# Patient Record
Sex: Female | Born: 1935
Health system: Southern US, Community
[De-identification: ages and names within clinical notes are randomized; demographics above are authoritative.]

## PROBLEM LIST (undated history)

## (undated) DIAGNOSIS — H9190 Unspecified hearing loss, unspecified ear: Secondary | ICD-10-CM

## (undated) DIAGNOSIS — G43909 Migraine, unspecified, not intractable, without status migrainosus: Secondary | ICD-10-CM

## (undated) DIAGNOSIS — N390 Urinary tract infection, site not specified: Secondary | ICD-10-CM

## (undated) DIAGNOSIS — I639 Cerebral infarction, unspecified: Secondary | ICD-10-CM

## (undated) DIAGNOSIS — F419 Anxiety disorder, unspecified: Secondary | ICD-10-CM

## (undated) HISTORY — PX: ABDOMINAL HYSTERECTOMY: SHX81

## (undated) HISTORY — PX: BLADDER SURGERY: SHX569

---

## 2010-12-31 ENCOUNTER — Encounter: Payer: Self-pay | Admitting: Internal Medicine

## 2014-03-25 DIAGNOSIS — S129XXA Fracture of neck, unspecified, initial encounter: Secondary | ICD-10-CM | POA: Insufficient documentation

## 2014-12-29 DIAGNOSIS — M5136 Other intervertebral disc degeneration, lumbar region: Secondary | ICD-10-CM | POA: Insufficient documentation

## 2014-12-29 DIAGNOSIS — M47819 Spondylosis without myelopathy or radiculopathy, site unspecified: Secondary | ICD-10-CM | POA: Insufficient documentation

## 2016-01-04 DIAGNOSIS — H903 Sensorineural hearing loss, bilateral: Secondary | ICD-10-CM | POA: Insufficient documentation

## 2016-03-16 DIAGNOSIS — I493 Ventricular premature depolarization: Secondary | ICD-10-CM | POA: Insufficient documentation

## 2016-03-16 DIAGNOSIS — R002 Palpitations: Secondary | ICD-10-CM | POA: Insufficient documentation

## 2016-10-11 DIAGNOSIS — M26609 Unspecified temporomandibular joint disorder, unspecified side: Secondary | ICD-10-CM | POA: Insufficient documentation

## 2016-11-03 DIAGNOSIS — G4730 Sleep apnea, unspecified: Secondary | ICD-10-CM | POA: Insufficient documentation

## 2016-11-03 DIAGNOSIS — F419 Anxiety disorder, unspecified: Secondary | ICD-10-CM | POA: Insufficient documentation

## 2016-11-03 DIAGNOSIS — F411 Generalized anxiety disorder: Secondary | ICD-10-CM | POA: Insufficient documentation

## 2017-11-19 ENCOUNTER — Emergency Department (HOSPITAL_BASED_OUTPATIENT_CLINIC_OR_DEPARTMENT_OTHER)
Admission: EM | Admit: 2017-11-19 | Discharge: 2017-11-19 | Disposition: A | Discharge status: Home / Self Care | Payer: Medicare HMO | Attending: Emergency Medicine | Admitting: Emergency Medicine

## 2017-11-19 ENCOUNTER — Other Ambulatory Visit: Payer: Self-pay

## 2017-11-19 ENCOUNTER — Encounter (HOSPITAL_BASED_OUTPATIENT_CLINIC_OR_DEPARTMENT_OTHER): Payer: Self-pay

## 2017-11-19 DIAGNOSIS — G43011 Migraine without aura, intractable, with status migrainosus: Secondary | ICD-10-CM | POA: Diagnosis not present

## 2017-11-19 DIAGNOSIS — Z79899 Other long term (current) drug therapy: Secondary | ICD-10-CM | POA: Insufficient documentation

## 2017-11-19 DIAGNOSIS — R51 Headache: Secondary | ICD-10-CM | POA: Diagnosis present

## 2017-11-19 DIAGNOSIS — H53149 Visual discomfort, unspecified: Secondary | ICD-10-CM | POA: Diagnosis not present

## 2017-11-19 HISTORY — DX: Cerebral infarction, unspecified: I63.9

## 2017-11-19 HISTORY — DX: Migraine, unspecified, not intractable, without status migrainosus: G43.909

## 2017-11-19 HISTORY — DX: Anxiety disorder, unspecified: F41.9

## 2017-11-19 HISTORY — DX: Unspecified hearing loss, unspecified ear: H91.90

## 2017-11-19 MED ORDER — METOCLOPRAMIDE HCL 5 MG/ML IJ SOLN
10.0000 mg | Freq: Once | INTRAMUSCULAR | Status: AC
  Administered 2017-11-19: 10 mg via INTRAMUSCULAR
  Filled 2017-11-19: qty 2

## 2017-11-19 MED ORDER — DEXAMETHASONE SODIUM PHOSPHATE 10 MG/ML IJ SOLN
10.0000 mg | Freq: Once | INTRAMUSCULAR | Status: AC
  Administered 2017-11-19: 10 mg via INTRAMUSCULAR
  Filled 2017-11-19: qty 1

## 2017-11-19 NOTE — ED Notes (Signed)
Patient is anxious to leave, asking multiple times in the hallway - patient is fully dressed - unable to obtain VS at d/c

## 2017-11-19 NOTE — ED Triage Notes (Addendum)
C/o HA x 5 days-states she has hx of migraines and recently treated for sinus infection-NAD-steady gait

## 2017-11-19 NOTE — ED Provider Notes (Signed)
[facility] EMERGENCY DEPARTMENT Provider Note   CSN: [id number] Arrival date & time: 11/19/17  1201     History   Chief Complaint Chief Complaint  Patient presents with  . Headache    HPI Shannon Wise is a 83 y.o. female.  The history is provided by the patient.  Headache   This is a recurrent problem. Episode onset: 5 days ago. The problem occurs constantly. The problem has not changed since onset.Associated with: started slowly without acute cause.  does get flashing lights in the eyes when headache starts. The pain is located in the left unilateral region. The quality of the pain is described as dull and throbbing. The pain is at a severity of 8/10. The pain is moderate. The pain does not radiate. Pertinent negatives include no anorexia, no fever, no near-syncope, no palpitations, no syncope, no shortness of breath, no nausea and no vomiting. She has tried acetaminophen for the symptoms. The treatment provided no relief.    Past Medical History:  Diagnosis Date  . Anxiety   . HOH (hard of hearing)   . Migraine   . Stroke [facility](HCC)     There are no active problems to display for this patient.   Past Surgical History:  Procedure Laterality Date  . ABDOMINAL HYSTERECTOMY    . BLADDER SURGERY      OB History    No data available       Home Medications    Prior to Admission medications   Medication Sig Start Date End Date Taking? Authorizing Provider  ALPRAZolam (XANAX PO) Take by mouth.   Yes [provider]  Hydrocodone-Acetaminophen (LORCET PO) Take by mouth.   Yes [provider]    Family History No family history on file.  Social History Social History   Tobacco Use  . Smoking status: Never Smoker  . Smokeless tobacco: Never Used  Substance Use Topics  . Alcohol use: No    Frequency: Never  . Drug use: No     Allergies   Codeine and Red dye   Review of Systems Review of Systems  Constitutional: Negative for  fever.  Respiratory: Negative for shortness of breath.   Cardiovascular: Negative for palpitations, syncope and near-syncope.  Gastrointestinal: Negative for anorexia, nausea and vomiting.  Neurological: Positive for headaches.  All other systems reviewed and are negative.    Physical Exam Updated Vital Signs BP 134/88 (BP Location: Right Arm)   Pulse 87   Temp (!) 97.3 F (36.3 C) (Oral)   Resp 18   Ht 5\' 5"  (1.651 m)   Wt 59.4 kg (131 lb)   SpO2 99%   BMI 21.80 kg/m   Physical Exam  Constitutional: She is oriented to person, place, and time. She appears well-developed and well-nourished. No distress.  HENT:  Head: Normocephalic and atraumatic.  Right Ear: Tympanic membrane normal.  Left Ear: Tympanic membrane normal.  Mouth/Throat: Oropharynx is clear and moist.  Eyes: Conjunctivae and EOM are normal. Pupils are equal, round, and reactive to light. Right eye exhibits no discharge. Left eye exhibits no discharge.  photophobia  Neck: Normal range of motion. Neck supple. No spinous process tenderness present. No neck rigidity. No Brudzinski's sign and no Kernig's sign noted.  Cardiovascular: Normal rate, regular rhythm, normal heart sounds and intact distal pulses.  No murmur heard. Pulmonary/Chest: Effort normal and breath sounds normal. No respiratory distress. She has no wheezes. She has no rales.  Abdominal: Soft. She exhibits no distension.  There is no tenderness. There is no rebound and no guarding.  Musculoskeletal: Normal range of motion. She exhibits no edema or tenderness.  Lymphadenopathy:    She has no cervical adenopathy.  Neurological: She is alert and oriented to person, place, and time. She has normal strength. No cranial nerve deficit or sensory deficit. Coordination and gait normal. GCS eye subscore is 4. GCS verbal subscore is 5. GCS motor subscore is 6.  Skin: Skin is warm and dry. Capillary refill takes less than 2 seconds. No rash noted. No erythema.    Psychiatric: She has a normal mood and affect. Her behavior is normal.  Nursing note and vitals reviewed.    ED Treatments / Results  Labs (all labs ordered are listed, but only abnormal results are displayed) Labs Reviewed - No data to display  EKG  EKG Interpretation None       Radiology No results found.  Procedures Procedures (including critical care time)  Medications Ordered in ED Medications  metoCLOPramide (REGLAN) injection 10 mg (not administered)  dexamethasone (DECADRON) injection 10 mg (not administered)     Initial Impression / Assessment and Plan / ED Course  I have reviewed the triage vital signs and the nursing notes.  Pertinent labs & imaging results that were available during my care of the patient were reviewed by me and considered in my medical decision making (see chart for details).     Pt with typical migraine HA without sx suggestive of SAH(sudden onset, worst of life, or deficits), infection, or cavernous vein thrombosis.  Normal neuro exam and vital signs.  Patient gets an aura prior to the headache starting. She has a long history of migraines but Tylenol is not taking it away. She denies any trauma or concern that this would be intracranial bleeding. Will give HA cocktail and re-eval.  4:54 PM HA resolved and pt d/ced home.   Final Clinical Impressions(s) / ED Diagnoses   Final diagnoses:  Intractable migraine without aura and with status migrainosus    ED Discharge Orders    None       Gwyneth SproutPlunkett, Damare Serano, MD 11/19/17 1654

## 2018-06-01 ENCOUNTER — Other Ambulatory Visit: Payer: Self-pay

## 2018-06-01 ENCOUNTER — Emergency Department (HOSPITAL_BASED_OUTPATIENT_CLINIC_OR_DEPARTMENT_OTHER)
Admission: EM | Admit: 2018-06-01 | Discharge: 2018-06-01 | Disposition: A | Discharge status: Home / Self Care | Payer: Medicare HMO | Attending: Emergency Medicine | Admitting: Emergency Medicine

## 2018-06-01 ENCOUNTER — Encounter (HOSPITAL_BASED_OUTPATIENT_CLINIC_OR_DEPARTMENT_OTHER): Payer: Self-pay | Admitting: Emergency Medicine

## 2018-06-01 DIAGNOSIS — M7122 Synovial cyst of popliteal space [Baker], left knee: Secondary | ICD-10-CM | POA: Diagnosis not present

## 2018-06-01 DIAGNOSIS — R600 Localized edema: Secondary | ICD-10-CM | POA: Diagnosis present

## 2018-06-01 MED ORDER — HYDROCODONE-ACETAMINOPHEN 5-325 MG PO TABS: 1.0000 | ORAL_TABLET | Freq: Four times a day (QID) | ORAL | 0 refills | Status: DC | PRN

## 2018-06-01 NOTE — Discharge Instructions (Signed)
Medications: Norco  Treatment: Take Norco as prescribed every 6 hours as needed for your pain.  Use ice 3-4 times daily alternating 20 minutes on, 20 minutes off.  Wear your knee sleeve for comfort.  Do not drink alcohol, drive, operate machinery or participate in any other potentially dangerous activities while taking opiate pain medication as it may make you sleepy. Do not take this medication with any other sedating medications, either prescription or over-the-counter. If you were prescribed Percocet or Vicodin, do not take these with acetaminophen (Tylenol) as it is already contained within these medications and overdose of Tylenol is dangerous.   This medication is an opiate (or narcotic) pain medication and can be habit forming.  Use it as little as possible to achieve adequate pain control.  Do not use or use it with extreme caution if you have a history of opiate abuse or dependence. This medication is intended for your use only - do not give any to anyone else and keep it in a secure place where nobody else, especially children, have access to it. It will also cause or worsen constipation, so you may want to consider taking an over-the-counter stool softener while you are taking this medication.  Follow-up: Please follow-up with Dr. Pearletha ForgeHudnall, with sports medicine, for further evaluation and treatment of your Baker's cyst.  Please return to the emergency department if you develop any new or worsening symptoms including warmth or redness of your knee, fever, or any other new or concerning symptoms.

## 2018-06-01 NOTE — ED Triage Notes (Signed)
Patient states that she has a cyst to the back of her left knee. She reports that it has been there for a week and feels like it may need to be drained

## 2018-06-01 NOTE — ED Provider Notes (Addendum)
[facility] EMERGENCY DEPARTMENT Provider Note   CSN: [id number] Arrival date & time: 06/01/18  1311     History   Chief Complaint Chief Complaint  Patient presents with  . Abscess    HPI Shannon Wise is a 84 y.o. female with history of stroke, migraine, anxiety who presents with a one-week history of pain behind her left knee.  She describes a cyst as previous and ache when she stands up.  She has had pain from her knee to her ankle in her calf.  She denies any numbness or tingling.  She was seen at urgent care who ordered ultrasound and d-dimer, which was elevated.  She was seen at [facility] and had a DVT study done which ruled out DVT, but confirmed Baker's cyst.  She denies any numbness or tingling, chest pain, shortness of breath, fevers, abdominal pain, nausea, vomiting, urinary symptoms.  She was given NSAIDs, but patient states she does not take NSAID medication due to GI upset and she is in a lot of pain.  HPI  Past Medical History:  Diagnosis Date  . Anxiety   . HOH (hard of hearing)   . Migraine   . Stroke [facility])     There are no active problems to display for this patient.   Past Surgical History:  Procedure Laterality Date  . ABDOMINAL HYSTERECTOMY    . BLADDER SURGERY       OB History   None      Home Medications    Prior to Admission medications   Medication Sig Start Date End Date Taking? Authorizing Provider  ALPRAZolam (XANAX PO) Take by mouth.    [provider]  HYDROcodone-acetaminophen (NORCO/VICODIN) 5-325 MG tablet Take 1 tablet by mouth every 6 (six) hours as needed. 06/01/18   Emi Holes, PA-C    Family History History reviewed. No pertinent family history.  Social History Social History   Tobacco Use  . Smoking status: Never Smoker  . Smokeless tobacco: Never Used  Substance Use Topics  . Alcohol use: No    Frequency: Never  . Drug use: No     Allergies   Codeine and  Red dye   Review of Systems Review of Systems  Constitutional: Negative for chills and fever.  HENT: Negative for facial swelling and sore throat.   Respiratory: Negative for shortness of breath.   Cardiovascular: Negative for chest pain.  Gastrointestinal: Negative for abdominal pain, nausea and vomiting.  Genitourinary: Negative for dysuria.  Musculoskeletal: Positive for arthralgias and joint swelling. Negative for back pain.  Skin: Negative for rash and wound.  Neurological: Negative for headaches.  Psychiatric/Behavioral: The patient is not nervous/anxious.      Physical Exam Updated Vital Signs BP 131/85 (BP Location: Right Arm)   Pulse 86   Temp 98.3 F (36.8 C) (Oral)   Resp 18   Ht 5\' 5"  (1.651 m)   Wt 61.2 kg (135 lb)   SpO2 98%   BMI 22.47 kg/m   Physical Exam  Constitutional: She appears well-developed and well-nourished. No distress.  HENT:  Head: Normocephalic and atraumatic.  Mouth/Throat: Oropharynx is clear and moist. No oropharyngeal exudate.  Eyes: Pupils are equal, round, and reactive to light. Conjunctivae are normal. Right eye exhibits no discharge. Left eye exhibits no discharge. No scleral icterus.  Neck: Normal range of motion. Neck supple. No thyromegaly present.  Cardiovascular: Normal rate, regular rhythm, normal heart sounds and intact distal pulses.  Exam reveals no gallop and no friction rub.  No murmur heard. Pulmonary/Chest: Effort normal and breath sounds normal. No stridor. No respiratory distress. She has no wheezes. She has no rales.  Abdominal: Soft. Bowel sounds are normal. She exhibits no distension. There is no tenderness. There is no rebound and no guarding.  Musculoskeletal: She exhibits no edema.  Mild edema in the popliteal space of the left knee, tenderness to the area extending to the left calf.  Sensation intact.  DP pulses intact.  5/5 strength to bilateral lower extremities.  Lymphadenopathy:    She has no cervical  adenopathy.  Neurological: She is alert. Coordination normal.  Skin: Skin is warm and dry. No rash noted. She is not diaphoretic. No pallor.  Psychiatric: She has a normal mood and affect.  Nursing note and vitals reviewed.    ED Treatments / Results  Labs (all labs ordered are listed, but only abnormal results are displayed) Labs Reviewed - No data to display  EKG None  Radiology No results found.  Procedures Procedures (including critical care time)  Medications Ordered in ED Medications - No data to display   Initial Impression / Assessment and Plan / ED Course  I have reviewed the triage vital signs and the nursing notes.  Pertinent labs & imaging results that were available during my care of the patient were reviewed by me and considered in my medical decision making (see chart for details).     Patient with diagnosed Baker's cyst at [facility] earlier this week.  She reports she has not wanted to take the NSAIDs they prescribed her and she is still in a lot of pain.  She denies any chest pain or shortness of breath. Per chart review, d-dimer seems to have been obtained for screening for DVT, not PE. She was negative for DVT on ultrasound that [facility].  Will place patient in knee sleeve.  I advised ice.  Will give short course of Norco for pain control and follow-up to Dr. Pearletha ForgeHudnall for further evaluation and treatment.  Return precautions discussed.  Patient understands and agrees with plan.  Patient vitals stable throughout ED course and discharged in satisfactory condition.  Patient also evaluated by Dr. Criss AlvineGoldston who guided the patient's management and agrees with plan.  Final Clinical Impressions(s) / ED Diagnoses   Final diagnoses:  Synovial cyst of left popliteal space    ED Discharge Orders        Ordered    HYDROcodone-acetaminophen (NORCO/VICODIN) 5-325 MG tablet  Every 6 hours PRN     06/01/18 1437         Emi HolesLaw, Emilio Baylock M,  PA-C 06/01/18 1444    Pricilla LovelessGoldston, Scott, MD 06/01/18 1514

## 2018-06-01 NOTE — ED Notes (Signed)
Patient stated that she has a cyst behind her knee cap as big as an egg.   I did not see or touch or feel the cyst.  She also stated that her entire left leg hurts.  Patient is hard of hearing.

## 2018-07-05 ENCOUNTER — Encounter (HOSPITAL_BASED_OUTPATIENT_CLINIC_OR_DEPARTMENT_OTHER): Payer: Self-pay | Admitting: Emergency Medicine

## 2018-07-05 ENCOUNTER — Other Ambulatory Visit: Payer: Self-pay

## 2018-07-05 ENCOUNTER — Emergency Department (HOSPITAL_BASED_OUTPATIENT_CLINIC_OR_DEPARTMENT_OTHER)
Admission: EM | Admit: 2018-07-05 | Discharge: 2018-07-05 | Disposition: A | Discharge status: Home / Self Care | Payer: Medicare HMO | Attending: Emergency Medicine | Admitting: Emergency Medicine

## 2018-07-05 DIAGNOSIS — Z79899 Other long term (current) drug therapy: Secondary | ICD-10-CM | POA: Insufficient documentation

## 2018-07-05 DIAGNOSIS — M7122 Synovial cyst of popliteal space [Baker], left knee: Secondary | ICD-10-CM | POA: Diagnosis not present

## 2018-07-05 DIAGNOSIS — M25562 Pain in left knee: Secondary | ICD-10-CM | POA: Diagnosis present

## 2018-07-05 MED ORDER — IBUPROFEN 400 MG PO TABS
400.0000 mg | ORAL_TABLET | Freq: Once | ORAL | Status: AC | PRN
  Administered 2018-07-05: 400 mg via ORAL
  Filled 2018-07-05: qty 1

## 2018-07-05 MED ORDER — HYDROCODONE-ACETAMINOPHEN 5-325 MG PO TABS: 1.0000 | ORAL_TABLET | Freq: Four times a day (QID) | ORAL | 0 refills | Status: DC | PRN

## 2018-07-05 MED ORDER — KETOROLAC TROMETHAMINE 15 MG/ML IJ SOLN
15.0000 mg | Freq: Once | INTRAMUSCULAR | Status: AC
  Administered 2018-07-05: 15 mg via INTRAMUSCULAR
  Filled 2018-07-05: qty 1

## 2018-07-05 MED ORDER — DEXAMETHASONE SODIUM PHOSPHATE 4 MG/ML IJ SOLN
8.0000 mg | Freq: Once | INTRAMUSCULAR | Status: AC
  Administered 2018-07-05: 8 mg via INTRAMUSCULAR
  Filled 2018-07-05: qty 2

## 2018-07-05 NOTE — ED Triage Notes (Signed)
Pt states she has a painful cyst to the back of her L knee that won't go away and "no one will do anything for it"

## 2018-07-05 NOTE — Discharge Instructions (Addendum)
Keep your appointment with orthopedics on Monday.

## 2018-07-05 NOTE — ED Provider Notes (Signed)
[facility] EMERGENCY DEPARTMENT Provider Note   CSN: [id number] Arrival date & time: 07/05/18  1247     History   Chief Complaint Chief Complaint  Patient presents with  . Knee Pain    HPI Shannon Wise is a 84 y.o. female.  HPI   84 year old female with left knee pain.  History of Baker's cyst.  Multiple recent evaluations for the same.  Per review of records, she has had an ultrasound for the same.  This confirmed a Baker's cyst and was negative for DVT.  She reports persistent pain that is not significantly helped by Tylenol.  She has no other acute complaints.  She reports upcoming orthopedic appointment on Monday.  Past Medical History:  Diagnosis Date  . Anxiety   . HOH (hard of hearing)   . Migraine   . Stroke [facility](HCC)     There are no active problems to display for this patient.   Past Surgical History:  Procedure Laterality Date  . ABDOMINAL HYSTERECTOMY    . BLADDER SURGERY       OB History   None      Home Medications    Prior to Admission medications   Medication Sig Start Date End Date Taking? Authorizing Provider  ALPRAZolam (XANAX PO) Take by mouth.    [provider]  HYDROcodone-acetaminophen (NORCO/VICODIN) 5-325 MG tablet Take 1 tablet by mouth every 6 (six) hours as needed. 06/01/18   Law, Waylan BogaAlexandra M, PA-C  HYDROcodone-acetaminophen (NORCO/VICODIN) 5-325 MG tablet Take 1 tablet by mouth every 6 (six) hours as needed. 07/05/18   Raeford RazorKohut, Maraya Gwilliam, MD    Family History No family history on file.  Social History Social History   Tobacco Use  . Smoking status: Never Smoker  . Smokeless tobacco: Never Used  Substance Use Topics  . Alcohol use: No    Frequency: Never  . Drug use: No     Allergies   Codeine and Red dye   Review of Systems Review of Systems  All systems reviewed and negative, other than as noted in HPI.  Physical Exam Updated Vital Signs BP (!) 139/97 (BP Location: Left Arm)   Pulse 80    Temp 98 F (36.7 C) (Oral)   Resp 18   Wt 61.2 kg (135 lb)   SpO2 100%   BMI 22.47 kg/m   Physical Exam  Constitutional: She appears well-developed and well-nourished. No distress.  HENT:  Head: Normocephalic and atraumatic.  Eyes: Conjunctivae are normal. Right eye exhibits no discharge. Left eye exhibits no discharge.  Neck: Neck supple.  Cardiovascular: Normal rate, regular rhythm and normal heart sounds. Exam reveals no gallop and no friction rub.  No murmur heard. Pulmonary/Chest: Effort normal and breath sounds normal. No respiratory distress.  Abdominal: Soft. She exhibits no distension. There is no tenderness. Hernia:    Musculoskeletal: She exhibits no edema or tenderness.  Soft mass L popliteal fossa consistent with Baker's cyst. No overlying skin changes.   Neurological: She is alert. Abnormal coordination:    Skin: Skin is warm and dry.  Psychiatric: She has a normal mood and affect. Her behavior is normal. Thought content normal.  Nursing note and vitals reviewed.    ED Treatments / Results  Labs (all labs ordered are listed, but only abnormal results are displayed) Labs Reviewed - No data to display  EKG None  Radiology No results found.  Procedures Procedures (including critical care time)  Medications Ordered in ED Medications  ibuprofen (  ADVIL,MOTRIN) tablet 400 mg (has no administration in time range)  ketorolac (TORADOL) 15 MG/ML injection 15 mg (has no administration in time range)  dexamethasone (DECADRON) injection 8 mg (has no administration in time range)     Initial Impression / Assessment and Plan / ED Course  I have reviewed the triage vital signs and the nursing notes.  Pertinent labs & imaging results that were available during my care of the patient were reviewed by me and considered in my medical decision making (see chart for details).     84yF with L knee pain. Symptomatic Baker's cyst. Recent notes mention no DVT on [place]. I  offered to aspirate and inject corticosteroid. She has been in the ED over two hours now and she says she cannot wait any longer for me to do this although I explained that it's a quick procedure. She reports she has appointment with orthopedic surgery on Monday anyways. She says she has previously "got two shots in my hip" for this which helped but she is not sure what specifically theit was. Given a dose of toradol and decadron prior to DC.   Final Clinical Impressions(s) / ED Diagnoses   Final diagnoses:  Synovial cyst of left popliteal space    ED Discharge Orders        Ordered    HYDROcodone-acetaminophen (NORCO/VICODIN) 5-325 MG tablet  Every 6 hours PRN     07/05/18 1515       Raeford Razor, MD 07/05/18 1533

## 2018-07-11 ENCOUNTER — Encounter: Payer: Self-pay | Admitting: Family Medicine

## 2018-07-11 ENCOUNTER — Ambulatory Visit: Payer: Medicare HMO | Admitting: Family Medicine

## 2018-07-11 VITALS — BP 129/86 | Ht 67.0 in | Wt 135.0 lb

## 2018-07-11 DIAGNOSIS — Z87442 Personal history of urinary calculi: Secondary | ICD-10-CM | POA: Insufficient documentation

## 2018-07-11 DIAGNOSIS — M25562 Pain in left knee: Secondary | ICD-10-CM | POA: Diagnosis not present

## 2018-07-11 MED ORDER — METHYLPREDNISOLONE ACETATE 40 MG/ML IJ SUSP
40.0000 mg | Freq: Once | INTRAMUSCULAR | Status: AC
  Administered 2018-07-11: 40 mg via INTRA_ARTICULAR

## 2018-07-11 NOTE — Patient Instructions (Signed)
Your pain is due to arthritis with a baker's cyst behind your knee. These are the different medications you can take for this: Tylenol 500mg  1-2 tabs three times a day for pain. Capsaicin, aspercreme, or biofreeze topically up to four times a day may also help with pain. Some supplements that may help for arthritis: Boswellia extract, curcumin, pycnogenol Aleve 1-2 tabs twice a day with food - take rarely because this can irritate your stomach, kidneys, and elevate your blood pressure Cortisone injections are an option - you were given this today. It's important that you continue to stay active. Straight leg raises, knee extensions 3 sets of 10 once a day (add ankle weight if these become too easy). Consider physical therapy to strengthen muscles around the joint that hurts to take pressure off of the joint itself. Shoe inserts with good arch support may be helpful. Heat or ice 15 minutes at a time 3-4 times a day as needed to help with pain. Water aerobics and cycling with low resistance are the best two types of exercise for arthritis though any exercise is ok as long as it doesn't worsen the pain. If not improving over 1-2 weeks give me a call and I'd inject the cyst directly. If this still didn't work we would have to do more imaging.

## 2018-07-11 NOTE — Progress Notes (Signed)
PCP: System, Pcp Not In  Subjective:   HPI: Patient is a 84 y.o. female here for left knee pain.  Patient reports for about 3 weeks she's had worsening left knee pain that feels like there's an egg behind her left knee. Pain level up to 8/10 and sharp, difficulty walking dur to this. Tried brace/sleeve. No injury. No fever, skin changes. Also tried norco, tylenol which did not help. Per notes has been going on longer - first visit on 6/17 for this at urgent care, had 2 days of posterior left knee pain with fullness here. She had doppler u/s on 6/18 negative for DVT but showed bakers cyst at 3.5x0.9x2.2cm No skin changes, numbness.  Past Medical History:  Diagnosis Date  . Anxiety   . HOH (hard of hearing)   . Migraine   . Stroke [facility](HCC)     Current Outpatient Medications on File Prior to Visit  Medication Sig Dispense Refill  . aspirin EC 81 MG tablet Take by mouth.    . fluticasone (FLONASE) 50 MCG/ACT nasal spray 1 spray by Each Nare route daily for 10 days.    Marland Kitchen. loratadine (CLARITIN) 10 MG tablet Take by mouth.    . metoprolol succinate (TOPROL XL) 25 MG 24 hr tablet Take by mouth.    . naproxen (NAPROSYN) 500 MG tablet Take by mouth.    . ALPRAZolam (XANAX PO) Take by mouth.    . butalbital-acetaminophen-caffeine (FIORICET, ESGIC) 50-325-40 MG tablet TK 1 T Q 4-6 H PRF HEADACHE  5  . HYDROcodone-acetaminophen (NORCO/VICODIN) 5-325 MG tablet Take 1 tablet by mouth every 6 (six) hours as needed. 10 tablet 0   No current facility-administered medications on file prior to visit.     Past Surgical History:  Procedure Laterality Date  . ABDOMINAL HYSTERECTOMY    . BLADDER SURGERY      Allergies  Allergen Reactions  . Codeine   . Red Dye     Social History   Socioeconomic History  . Marital status: Divorced    Spouse name: Not on file  . Number of children: Not on file  . Years of education: Not on file  . Highest education level: Not on file  Occupational  History  . Not on file  Social Needs  . Financial resource strain: Not on file  . Food insecurity:    Worry: Not on file    Inability: Not on file  . Transportation needs:    Medical: Not on file    Non-medical: Not on file  Tobacco Use  . Smoking status: Never Smoker  . Smokeless tobacco: Never Used  Substance and Sexual Activity  . Alcohol use: No    Frequency: Never  . Drug use: No  . Sexual activity: Not on file  Lifestyle  . Physical activity:    Days per week: Not on file    Minutes per session: Not on file  . Stress: Not on file  Relationships  . Social connections:    Talks on phone: Not on file    Gets together: Not on file    Attends religious service: Not on file    Active member of club or organization: Not on file    Attends meetings of clubs or organizations: Not on file    Relationship status: Not on file  . Intimate partner violence:    Fear of current or ex partner: Not on file    Emotionally abused: Not on file    Physically  abused: Not on file    Forced sexual activity: Not on file  Other Topics Concern  . Not on file  Social History Narrative  . Not on file    History reviewed. No pertinent family history.  BP 129/86   Ht 5\' 7"  (1.702 m)   Wt 135 lb (61.2 kg)   BMI 21.14 kg/m   Review of Systems: See HPI above.     Objective:  Physical Exam:  Gen: NAD, comfortable in exam room  Left knee: Minimal effusion.  Small fullness behind knee.  No other gross deformity, ecchymoses. Mild TTP medial lateral joint lines and posterior patellar facets. FROM with 5/5 strength flexion and extension. Negative ant/post drawers. Negative valgus/varus testing. Negative lachmanns. Negative mcmurrays, apleys, patellar apprehension. NV intact distally.  Right knee: No deformity. FROM with 5/5 strength. No tenderness to palpation. NVI distally.   MSK u/s left knee:  Minimal effusion.  Small bakers cyst measuring 2.3x0.9cm  Assessment & Plan:  1.  Left knee pain - consistent with arthritis causing baker's cyst.  Discussed options - tylenol, topical medications, supplements reviewed.  Rare aleve.  Injection given today.  Shown home exercises to do daily.  If not improving would consider injection of cyst directly with ultrasound guidance.    After informed written consent timeout was performed, patient was lying supine on exam table. Left knee was prepped with alcohol swab and utilizing superolateral approach, patient's left knee was injected intraarticularly with 3:1 bupivicaine: depomedrol. Patient tolerated the procedure well without immediate complications.

## 2018-07-20 ENCOUNTER — Encounter (HOSPITAL_BASED_OUTPATIENT_CLINIC_OR_DEPARTMENT_OTHER): Payer: Self-pay | Admitting: Emergency Medicine

## 2018-07-20 ENCOUNTER — Other Ambulatory Visit: Payer: Self-pay

## 2018-07-20 ENCOUNTER — Emergency Department (HOSPITAL_BASED_OUTPATIENT_CLINIC_OR_DEPARTMENT_OTHER): Payer: Medicare HMO

## 2018-07-20 ENCOUNTER — Emergency Department (HOSPITAL_BASED_OUTPATIENT_CLINIC_OR_DEPARTMENT_OTHER)
Admission: EM | Admit: 2018-07-20 | Discharge: 2018-07-20 | Disposition: A | Discharge status: Home / Self Care | Payer: Medicare HMO | Attending: Emergency Medicine | Admitting: Emergency Medicine

## 2018-07-20 DIAGNOSIS — Z8673 Personal history of transient ischemic attack (TIA), and cerebral infarction without residual deficits: Secondary | ICD-10-CM | POA: Diagnosis not present

## 2018-07-20 DIAGNOSIS — Z7982 Long term (current) use of aspirin: Secondary | ICD-10-CM | POA: Insufficient documentation

## 2018-07-20 DIAGNOSIS — R0789 Other chest pain: Secondary | ICD-10-CM | POA: Insufficient documentation

## 2018-07-20 DIAGNOSIS — Z79899 Other long term (current) drug therapy: Secondary | ICD-10-CM | POA: Insufficient documentation

## 2018-07-20 DIAGNOSIS — N390 Urinary tract infection, site not specified: Secondary | ICD-10-CM | POA: Insufficient documentation

## 2018-07-20 LAB — CBC WITH DIFFERENTIAL/PLATELET
BASOS ABS: 0.1 10*3/uL (ref 0.0–0.1)
BASOS PCT: 1 %
Band Neutrophils: 0 %
Blasts: 0 %
EOS ABS: 0.1 10*3/uL (ref 0.0–0.7)
Eosinophils Relative: 1 %
HCT: 44.1 % (ref 36.0–46.0)
HEMOGLOBIN: 14.7 g/dL (ref 12.0–15.0)
LYMPHS PCT: 28 %
Lymphs Abs: 2.3 10*3/uL (ref 0.7–4.0)
MCH: 30.1 pg (ref 26.0–34.0)
MCHC: 33.3 g/dL (ref 30.0–36.0)
MCV: 90.2 fL (ref 78.0–100.0)
METAMYELOCYTES PCT: 0 %
MYELOCYTES: 0 %
Monocytes Absolute: 0.7 10*3/uL (ref 0.1–1.0)
Monocytes Relative: 9 %
Neutro Abs: 4.9 10*3/uL (ref 1.7–7.7)
Neutrophils Relative %: 61 %
PROMYELOCYTES RELATIVE: 0 %
Platelets: 202 10*3/uL (ref 150–400)
RBC: 4.89 MIL/uL (ref 3.87–5.11)
RDW: 13.3 % (ref 11.5–15.5)
WBC: 8.1 10*3/uL (ref 4.0–10.5)
nRBC: 0 /100 WBC

## 2018-07-20 LAB — URINALYSIS, ROUTINE W REFLEX MICROSCOPIC
Bilirubin Urine: NEGATIVE
Glucose, UA: NEGATIVE mg/dL
Hgb urine dipstick: NEGATIVE
KETONES UR: NEGATIVE mg/dL
Nitrite: NEGATIVE
PROTEIN: NEGATIVE mg/dL
Specific Gravity, Urine: <1.005 — ABNORMAL LOW (ref 1.005–1.030)
pH: 7 (ref 5.0–8.0)

## 2018-07-20 LAB — TROPONIN I: Troponin I: <0.03 ng/mL (ref <0.03)

## 2018-07-20 LAB — BASIC METABOLIC PANEL
Anion gap: 10 (ref 5–15)
BUN: 7 mg/dL — AB (ref 8–23)
CALCIUM: 9.3 mg/dL (ref 8.9–10.3)
CO2: 26 mmol/L (ref 22–32)
Chloride: 103 mmol/L (ref 98–111)
Creatinine, Ser: 0.83 mg/dL (ref 0.44–1.00)
GFR calc Af Amer: >60 mL/min (ref >60)
Glucose, Bld: 105 mg/dL — ABNORMAL HIGH (ref 70–99)
POTASSIUM: 3.9 mmol/L (ref 3.5–5.1)
SODIUM: 139 mmol/L (ref 135–145)

## 2018-07-20 LAB — URINALYSIS, MICROSCOPIC (REFLEX)

## 2018-07-20 MED ORDER — ASPIRIN 81 MG PO CHEW
324.0000 mg | CHEWABLE_TABLET | Freq: Once | ORAL | Status: AC
  Administered 2018-07-20: 324 mg via ORAL
  Filled 2018-07-20: qty 4

## 2018-07-20 MED ORDER — CEPHALEXIN 250 MG PO CAPS
500.0000 mg | ORAL_CAPSULE | Freq: Once | ORAL | Status: AC
  Administered 2018-07-20: 500 mg via ORAL
  Filled 2018-07-20: qty 2

## 2018-07-20 MED ORDER — ACETAMINOPHEN 325 MG PO TABS
650.0000 mg | ORAL_TABLET | Freq: Once | ORAL | Status: AC
  Administered 2018-07-20: 650 mg via ORAL
  Filled 2018-07-20: qty 2

## 2018-07-20 MED ORDER — CEPHALEXIN 500 MG PO CAPS: 500.0000 mg | ORAL_CAPSULE | Freq: Two times a day (BID) | ORAL | 0 refills | Status: AC

## 2018-07-20 NOTE — ED Provider Notes (Signed)
[facility] EMERGENCY DEPARTMENT Provider Note   CSN: [id number] Arrival date & time: 07/20/18  1526     History   Chief Complaint Chief Complaint  Patient presents with  . Dysuria  . Chest Pain    HPI Shannon Wise is a 84 y.o. female.  HPI  84 year old female presents with chest pain.  She is very hard of hearing which limits the history is somewhat.  States the chest pain started this morning around 9 AM.  Was coming and going but now is completely gone.  Was concerned she might be having a heart attack.  Is unable to describe exactly what the pain felt like.  Since the main pain is gone away she has had some fleeting brief sharp pain to her left lower chest but none now.  No shortness of breath, nausea, vomiting.  There was some mid back pain during the chest pain but that is also gone.  When asked about urinary symptoms she states she is having dysuria but that is also a chronic issue.  Was given some sort of medicine a couple weeks ago that made it go away but now is having dysuria over the last 2 weeks.  Past Medical History:  Diagnosis Date  . Anxiety   . HOH (hard of hearing)   . Migraine   . Stroke [facility](HCC)     Patient Active Problem List   Diagnosis Date Noted  . History of kidney stones 07/11/2018  . Sleep apnea 11/03/2016  . Anxiety 11/03/2016  . TMJ (temporomandibular joint disorder) 10/11/2016  . PVC (premature ventricular contraction) 03/16/2016  . Palpitations 03/16/2016  . Sensorineural hearing loss (SNHL), bilateral 01/04/2016  . Facet arthropathy 12/29/2014  . DDD (degenerative disc disease), lumbar 12/29/2014  . Cervical spine fracture (HCC) 03/25/2014    Past Surgical History:  Procedure Laterality Date  . ABDOMINAL HYSTERECTOMY    . BLADDER SURGERY       OB History   None      Home Medications    Prior to Admission medications   Medication Sig Start Date End Date Taking? Authorizing Provider  ALPRAZolam (XANAX PO) Take by  mouth.    [provider]  aspirin EC 81 MG tablet Take by mouth. 02/14/17   [provider]  butalbital-acetaminophen-caffeine (FIORICET, ESGIC) 50-325-40 MG tablet TK 1 T Q 4-6 H PRF HEADACHE 06/30/18   [provider]  cephALEXin (KEFLEX) 500 MG capsule Take 1 capsule (500 mg total) by mouth 2 (two) times daily for 5 days. 07/20/18 07/25/18  Pricilla LovelessGoldston, Tishara Pizano, MD  fluticasone (FLONASE) 50 MCG/ACT nasal spray 1 spray by Each Nare route daily for 10 days. 11/07/17   [provider]  HYDROcodone-acetaminophen (NORCO/VICODIN) 5-325 MG tablet Take 1 tablet by mouth every 6 (six) hours as needed. 07/05/18   Raeford RazorKohut, Stephen, MD  loratadine (CLARITIN) 10 MG tablet Take by mouth. 11/07/17   [provider]  metoprolol succinate (TOPROL XL) 25 MG 24 hr tablet Take by mouth. 11/04/16   [provider]  naproxen (NAPROSYN) 500 MG tablet Take by mouth. 05/27/18   [provider]    Family History History reviewed. No pertinent family history.  Social History Social History   Tobacco Use  . Smoking status: Never Smoker  . Smokeless tobacco: Never Used  Substance Use Topics  . Alcohol use: No    Frequency: Never  . Drug use: No     Allergies   Codeine and Red dye  Review of Systems Review of Systems  Respiratory: Negative for shortness of breath.   Cardiovascular: Positive for chest pain.  Gastrointestinal: Negative for abdominal pain, nausea and vomiting.  Genitourinary: Positive for dysuria.  Musculoskeletal: Positive for back pain.  All other systems reviewed and are negative.    Physical Exam Updated Vital Signs BP (!) 145/97 (BP Location: Right Arm)   Pulse 65   Temp 97.8 F (36.6 C) (Oral)   Resp 17   Ht 5\' 7"  (1.702 m)   Wt 61.2 kg   SpO2 100%   BMI 21.13 kg/m   Physical Exam  Constitutional: She appears well-developed and well-nourished.  HENT:  Head: Normocephalic and atraumatic.  Right Ear: External ear  normal.  Left Ear: External ear normal.  Nose: Nose normal.  Eyes: Right eye exhibits no discharge. Left eye exhibits no discharge.  Cardiovascular: Normal rate, regular rhythm and normal heart sounds.  Pulses:      Radial pulses are 2+ on the right side, and 2+ on the left side.  Pulmonary/Chest: Effort normal and breath sounds normal.  Abdominal: Soft. She exhibits no distension. There is no tenderness. There is no CVA tenderness.  Musculoskeletal:       Right lower leg: She exhibits no edema.       Left lower leg: She exhibits no edema.  Left popliteal cyst  Neurological: She is alert.  Skin: Skin is warm and dry.  Nursing note and vitals reviewed.    ED Treatments / Results  Labs (all labs ordered are listed, but only abnormal results are displayed) Labs Reviewed  URINALYSIS, ROUTINE W REFLEX MICROSCOPIC - Abnormal; Notable for the following components:      Result Value   APPearance HAZY (*)    Specific Gravity, Urine <1.005 (*)    Leukocytes, UA TRACE (*)    All other components within normal limits  BASIC METABOLIC PANEL - Abnormal; Notable for the following components:   Glucose, Bld 105 (*)    BUN 7 (*)    All other components within normal limits  URINALYSIS, MICROSCOPIC (REFLEX) - Abnormal; Notable for the following components:   Bacteria, UA MANY (*)    All other components within normal limits  URINE CULTURE  TROPONIN I  CBC WITH DIFFERENTIAL/PLATELET  TROPONIN I    EKG EKG Interpretation  Date/Time:  Sunday July 20 2018 15:32:36 EDT Ventricular Rate:  76 PR Interval:  150 QRS Duration: 86 QT Interval:  398 QTC Calculation: 447 R Axis:   -57 Text Interpretation:  Normal sinus rhythm Left axis deviation Nonspecific ST abnormality Abnormal ECG No old tracing to compare Confirmed by Pricilla Loveless [phone]) on 07/20/2018 3:35:02 PM   EKG Interpretation  Date/Time:  Sunday July 20 2018 20:13:31 EDT Ventricular Rate:  64 PR Interval:  150 QRS  Duration: 102 QT Interval:  409 QTC Calculation: 422 R Axis:   -61 Text Interpretation:  Sinus rhythm LAD, consider left anterior fascicular block Borderline T abnormalities, anterior leads no significant change since earlier in the day Confirmed by Pricilla Loveless [phone]) on 07/20/2018 8:18:32 PM        Radiology Dg Chest 2 View  Result Date: 07/20/2018 CLINICAL DATA:  Patient states that she has burning in her private parts " so bad" - the patient also has pain to her chest all morning and thinks she is having a heart attack , HOH, anxiety, hx stroke EXAM: CHEST - 2 VIEW COMPARISON:  None. FINDINGS: Cardiac silhouette is normal in  size. No mediastinal or hilar masses. No evidence of adenopathy. Lungs are hyperexpanded. There are mildly thickened interstitial markings that appear chronic. There is no evidence of pneumonia or pulmonary edema. No pleural effusion or pneumothorax. Skeletal structures are demineralized but intact. IMPRESSION: No acute cardiopulmonary disease. Electronically Signed   By: Amie Portland M.D.   On: 07/20/2018 16:21    Procedures Procedures (including critical care time)  Medications Ordered in ED Medications  aspirin chewable tablet 324 mg (324 mg Oral Given 07/20/18 1800)  cephALEXin (KEFLEX) capsule 500 mg (500 mg Oral Given 07/20/18 1800)  acetaminophen (TYLENOL) tablet 650 mg (650 mg Oral Given 07/20/18 1926)     Initial Impression / Assessment and Plan / ED Course  I have reviewed the triage vital signs and the nursing notes.  Pertinent labs & imaging results that were available during my care of the patient were reviewed by me and considered in my medical decision making (see chart for details).     Patient has no chest pain on arrival to the emergency department.  ECG shows nonspecific ST changes.  Outside report ECGs also reported nonspecific ST/T-segment but I cannot review these ECGs.  However her repeat ECG is unchanged.  Initial troponin negative.   Given age and nonspecific ST findings I discussed observation admission but she declines and wants to go home.  She is willing to stay for a second troponin which is negative.  My suspicion for PE or dissection as well as ACS is low given these atypical findings.  She also has a symptomatic UTI and while she is not the best historian as to how long this is been going on I think is reasonable to send for culture and treat with Keflex.  Discussed return precautions with patient and daughter.  Final Clinical Impressions(s) / ED Diagnoses   Final diagnoses:  Atypical chest pain  Acute UTI    ED Discharge Orders         Ordered    cephALEXin (KEFLEX) 500 MG capsule  2 times daily     07/20/18 2126           Pricilla Loveless, MD 07/20/18 [phone]

## 2018-07-20 NOTE — ED Triage Notes (Signed)
Patient states that she has burning in her private parts " so bad"  - the patient also has pain to her chest all morning and thinks she is having a heart attack

## 2018-07-20 NOTE — Discharge Instructions (Addendum)
If your chest pain recurs or you develop any new/concerning symptoms such as fever, vomiting, shortness of breath, or anything else new/concerning then return to the ER for evaluation.  Otherwise follow-up with your primary care physician.

## 2018-07-20 NOTE — ED Notes (Signed)
ED Provider at bedside. 

## 2018-07-23 LAB — URINE CULTURE

## 2018-07-24 ENCOUNTER — Telehealth: Payer: Self-pay | Admitting: *Deleted

## 2018-07-24 NOTE — Telephone Encounter (Signed)
Post ED Visit - Positive Culture Follow-up  Culture report reviewed by antimicrobial stewardship pharmacist:  []  Enzo BiNathan Batchelder, Pharm.D. []  Celedonio MiyamotoJeremy Frens, Pharm.D., BCPS AQ-ID []  Garvin FilaMike Maccia, Pharm.D., BCPS []  Georgina PillionElizabeth Martin, Pharm.D., BCPS []  DixonMinh Pham, [address]Pharm.D., BCPS, AAHIVP []  Estella HuskMichelle Turner, Pharm.D., BCPS, AAHIVP []  Lysle Pearlachel Rumbarger, PharmD, BCPS []  Phillips Climeshuy Dang, PharmD, BCPS []  Agapito GamesAlison Masters, PharmD, BCPS []  Verlan FriendsErin Deja, PharmD [facility] Janee Mornhompson, Pharm D  Positive urine culture Treated with Cephalexin, organism sensitive to the same and no further patient follow-up is required at this time.  Virl AxeRobertson, Brittin Belnap [facility] 07/24/2018, 9:47 AM

## 2018-08-17 ENCOUNTER — Encounter (HOSPITAL_BASED_OUTPATIENT_CLINIC_OR_DEPARTMENT_OTHER): Payer: Self-pay | Admitting: Adult Health

## 2018-08-17 ENCOUNTER — Emergency Department (HOSPITAL_BASED_OUTPATIENT_CLINIC_OR_DEPARTMENT_OTHER): Payer: Medicare HMO

## 2018-08-17 ENCOUNTER — Emergency Department (HOSPITAL_BASED_OUTPATIENT_CLINIC_OR_DEPARTMENT_OTHER)
Admission: EM | Admit: 2018-08-17 | Discharge: 2018-08-17 | Disposition: A | Discharge status: Home / Self Care | Payer: Medicare HMO | Attending: Emergency Medicine | Admitting: Emergency Medicine

## 2018-08-17 ENCOUNTER — Other Ambulatory Visit: Payer: Self-pay

## 2018-08-17 DIAGNOSIS — N390 Urinary tract infection, site not specified: Secondary | ICD-10-CM | POA: Insufficient documentation

## 2018-08-17 DIAGNOSIS — R079 Chest pain, unspecified: Secondary | ICD-10-CM | POA: Insufficient documentation

## 2018-08-17 DIAGNOSIS — Z8673 Personal history of transient ischemic attack (TIA), and cerebral infarction without residual deficits: Secondary | ICD-10-CM | POA: Diagnosis not present

## 2018-08-17 DIAGNOSIS — R102 Pelvic and perineal pain: Secondary | ICD-10-CM | POA: Diagnosis present

## 2018-08-17 DIAGNOSIS — F419 Anxiety disorder, unspecified: Secondary | ICD-10-CM | POA: Insufficient documentation

## 2018-08-17 LAB — COMPREHENSIVE METABOLIC PANEL
ALBUMIN: 3.8 g/dL (ref 3.5–5.0)
ALT: 10 U/L (ref 0–44)
ANION GAP: 9 (ref 5–15)
AST: 19 U/L (ref 15–41)
Alkaline Phosphatase: 88 U/L (ref 38–126)
BUN: 8 mg/dL (ref 8–23)
CO2: 25 mmol/L (ref 22–32)
Calcium: 8.7 mg/dL — ABNORMAL LOW (ref 8.9–10.3)
Chloride: 103 mmol/L (ref 98–111)
Creatinine, Ser: 0.79 mg/dL (ref 0.44–1.00)
GFR calc non Af Amer: >60 mL/min (ref >60)
GLUCOSE: 95 mg/dL (ref 70–99)
Potassium: 4.7 mmol/L (ref 3.5–5.1)
SODIUM: 137 mmol/L (ref 135–145)
Total Bilirubin: 0.6 mg/dL (ref 0.3–1.2)
Total Protein: 6.7 g/dL (ref 6.5–8.1)

## 2018-08-17 LAB — URINALYSIS, MICROSCOPIC (REFLEX)

## 2018-08-17 LAB — CBC
HCT: 43.3 % (ref 36.0–46.0)
HEMOGLOBIN: 14.3 g/dL (ref 12.0–15.0)
MCH: 30.1 pg (ref 26.0–34.0)
MCHC: 33 g/dL (ref 30.0–36.0)
MCV: 91.2 fL (ref 78.0–100.0)
PLATELETS: 297 10*3/uL (ref 150–400)
RBC: 4.75 MIL/uL (ref 3.87–5.11)
RDW: 13.6 % (ref 11.5–15.5)
WBC: 6.6 10*3/uL (ref 4.0–10.5)

## 2018-08-17 LAB — URINALYSIS, ROUTINE W REFLEX MICROSCOPIC
Bilirubin Urine: NEGATIVE
Glucose, UA: NEGATIVE mg/dL
Hgb urine dipstick: NEGATIVE
Ketones, ur: NEGATIVE mg/dL
Nitrite: POSITIVE — AB
PROTEIN: NEGATIVE mg/dL
SPECIFIC GRAVITY, URINE: 1.025 (ref 1.005–1.030)
pH: 6 (ref 5.0–8.0)

## 2018-08-17 LAB — TROPONIN I: Troponin I: <0.03 ng/mL (ref <0.03)

## 2018-08-17 MED ORDER — CEPHALEXIN 500 MG PO CAPS: 500.0000 mg | ORAL_CAPSULE | Freq: Two times a day (BID) | ORAL | 0 refills | Status: AC

## 2018-08-17 NOTE — ED Provider Notes (Signed)
[facility] Emergency Department Provider Note MRN:  [id number]  Arrival date & time: 08/17/18     Chief Complaint   Vaginal Pain   History of Present Illness   Shannon Wise is a 84 y.o. year-old female with a history of anxiety, stroke presenting to the ED with chief complaint of vaginal pain and chest pain.  Patient's primary complaint is vaginal burning, which began yesterday afternoon, gradual onset, progressively worsening.  Says it burdens on the inside and out.  Denies rash, no bleeding or discharge.  Endorsing mild lower abdominal discomfort.  Denies fevers.  Denies dysuria.  This morning patient also endorsing 30 minutes of moderate to severe chest pain, located on the left side of the chest, nonradiating.  States it felt like a very sharp pain.  Now resolved.  Not associated with any other symptoms, denying dizziness or diaphoresis, no nausea or vomiting, no shortness of breath.  Review of Systems  A complete 10 system review of systems was obtained and all systems are negative except as noted in the HPI and PMH.   Patient's Health History    Past Medical History:  Diagnosis Date  . Anxiety   . HOH (hard of hearing)   . Migraine   . Stroke [facility])     Past Surgical History:  Procedure Laterality Date  . ABDOMINAL HYSTERECTOMY    . BLADDER SURGERY      History reviewed. No pertinent family history.  Social History   Socioeconomic History  . Marital status: Divorced    Spouse name: Not on file  . Number of children: Not on file  . Years of education: Not on file  . Highest education level: Not on file  Occupational History  . Not on file  Social Needs  . Financial resource strain: Not on file  . Food insecurity:    Worry: Not on file    Inability: Not on file  . Transportation needs:    Medical: Not on file    Non-medical: Not on file  Tobacco Use  . Smoking status: Never Smoker  . Smokeless tobacco: Never Used  Substance  and Sexual Activity  . Alcohol use: No    Frequency: Never  . Drug use: No  . Sexual activity: Not on file  Lifestyle  . Physical activity:    Days per week: Not on file    Minutes per session: Not on file  . Stress: Not on file  Relationships  . Social connections:    Talks on phone: Not on file    Gets together: Not on file    Attends religious service: Not on file    Active member of club or organization: Not on file    Attends meetings of clubs or organizations: Not on file    Relationship status: Not on file  . Intimate partner violence:    Fear of current or ex partner: Not on file    Emotionally abused: Not on file    Physically abused: Not on file    Forced sexual activity: Not on file  Other Topics Concern  . Not on file  Social History Narrative  . Not on file     Physical Exam  Vital Signs and Nursing Notes reviewed Vitals:   08/17/18 1400 08/17/18 1430  BP: 124/63 137/83  Pulse: 77 72  Resp: (!) 25 20  Temp:    SpO2: 100% 100%    CONSTITUTIONAL: Well-appearing, NAD NEURO:  Alert and  oriented x 3, no focal deficits EYES:  eyes equal and reactive ENT/NECK:  no LAD, no JVD CARDIO: Regular rate, well-perfused, normal S1 and S2 PULM:  CTAB no wheezing or rhonchi GI/GU:  normal bowel sounds, non-distended, non-tender, normal external genitalia, no rash, no vaginal discharge or bleeding within the vaginal vault, no cervical motion tenderness, pelvic exam chaperoned by female emergency technician MSK/SPINE:  No gross deformities, no edema SKIN:  no rash, atraumatic PSYCH:  Appropriate speech and behavior  Diagnostic and Interventional Summary    EKG Interpretation  Date/Time:  "Sunday August 17 2018 13:56:53 EDT Ventricular Rate:  75 PR Interval:    QRS Duration: 105 QT Interval:  399 QTC Calculation: 446 R Axis:   -55 Text Interpretation:  Sinus rhythm Ventricular trigeminy Left anterior fascicular block Low voltage, precordial leads Confirmed by  Jaslyn Bansal ([id number]) on 08/17/2018 2:35:09 PM      Labs Reviewed  URINALYSIS, ROUTINE W REFLEX MICROSCOPIC - Abnormal; Notable for the following components:      Result Value   APPearance HAZY (*)    Nitrite POSITIVE (*)    Leukocytes, UA SMALL (*)    All other components within normal limits  URINALYSIS, MICROSCOPIC (REFLEX) - Abnormal; Notable for the following components:   Bacteria, UA MANY (*)    All other components within normal limits  COMPREHENSIVE METABOLIC PANEL - Abnormal; Notable for the following components:   Calcium 8.7 (*)    All other components within normal limits  CBC  TROPONIN I    DG Chest 2 View  Final Result      Medications - No data to display   Procedures Critical Care  ED Course and Medical Decision Making  I have reviewed the triage vital signs and the nursing notes.  Pertinent labs & imaging results that were available during my care of the patient were reviewed by me and considered in my medical decision making (see below for details).    Chest pain today is atypical, has been worked up previously and thought to be noncardiac.  EKG on concerning today, troponin negative.  Little to no concern for ACS or PE.  Patient's main concern was continued dysuria.  UA consistent with continued infection.  Per chart review, had a recent culture that grew E. coli resistant to both Bactrim and levofloxacin.  Patient unsure which antibiotic she was on previously, will give Keflex today as it is sensitive to this antibiotic.  After the discussed management above, the patient was determined to be safe for discharge.  The patient was in agreement with this plan and all questions regarding their care were answered.  ED return precautions were discussed and the patient will return to the ED with any significant worsening of condition.  Lizmarie Witters M. Jujuan Dugo, MD [facility] [email or web address]  Final Clinical Impressions(s)  / ED Diagnoses     ICD-10-CM   1. Urinary tract infection without hematuria, site unspecified N39.0   2. Chest pain R07.9 DG Chest 2 View    DG Chest 2 View    ED Discharge Orders         Ordered    cephALEXin (KEFLEX) 500 MG capsule  2 times daily     09" /08/19 1452             Sabas Sous, MD 08/17/18 1454

## 2018-08-17 NOTE — Discharge Instructions (Addendum)
You were evaluated in the Emergency Department and after careful evaluation, we did not find any emergent condition requiring admission or further testing in the hospital.  Your symptoms today seem to be due to a continued urinary tract infection.  Please use the antibiotics as directed and follow-up with your regular doctor.  Please return to the Emergency Department if you experience any worsening of your condition.  We encourage you to follow up with a primary care provider.  Thank you for allowing [place] to be a part of your care.

## 2018-08-17 NOTE — ED Triage Notes (Addendum)
Presents with vaginal burning and pain that began yesterday. She has not taken anything at home. She denies discharge. Endorses dysuria

## 2018-08-17 NOTE — ED Notes (Signed)
Pt on cardiac monitor and auto VS 

## 2018-08-17 NOTE — ED Notes (Signed)
Delay in xray, RN drawing labs.  

## 2018-09-02 ENCOUNTER — Emergency Department (HOSPITAL_BASED_OUTPATIENT_CLINIC_OR_DEPARTMENT_OTHER)
Admission: EM | Admit: 2018-09-02 | Discharge: 2018-09-02 | Disposition: A | Discharge status: Home / Self Care | Payer: Medicare HMO | Attending: Emergency Medicine | Admitting: Emergency Medicine

## 2018-09-02 ENCOUNTER — Encounter (HOSPITAL_BASED_OUTPATIENT_CLINIC_OR_DEPARTMENT_OTHER): Payer: Self-pay | Admitting: *Deleted

## 2018-09-02 ENCOUNTER — Other Ambulatory Visit: Payer: Self-pay

## 2018-09-02 DIAGNOSIS — N949 Unspecified condition associated with female genital organs and menstrual cycle: Secondary | ICD-10-CM | POA: Insufficient documentation

## 2018-09-02 DIAGNOSIS — Z8673 Personal history of transient ischemic attack (TIA), and cerebral infarction without residual deficits: Secondary | ICD-10-CM | POA: Diagnosis not present

## 2018-09-02 LAB — URINALYSIS, ROUTINE W REFLEX MICROSCOPIC
Glucose, UA: NEGATIVE mg/dL
Hgb urine dipstick: NEGATIVE
Ketones, ur: 15 mg/dL — AB
NITRITE: NEGATIVE
Protein, ur: NEGATIVE mg/dL
Specific Gravity, Urine: >1.03 — ABNORMAL HIGH (ref 1.005–1.030)
pH: 5 (ref 5.0–8.0)

## 2018-09-02 LAB — URINALYSIS, MICROSCOPIC (REFLEX)

## 2018-09-02 MED ORDER — ESTRADIOL 0.1 MG/GM VA CREA: 1.0000 | TOPICAL_CREAM | Freq: Every day | VAGINAL | 12 refills | Status: DC

## 2018-09-02 NOTE — ED Provider Notes (Signed)
[facility] EMERGENCY DEPARTMENT Provider Note   CSN: [id number] Arrival date & time: 09/02/18  1214     History   Chief Complaint Chief Complaint  Patient presents with  . Back Pain  . Vaginal Itching    HPI Shannon Wise is a 84 y.o. female.  HPI 84 year old female presents the emergency department ongoing vaginal burning and irritation of the past several weeks.  She was treated for possible UTI and yeast infection but reports it continues to bother her.  She states the discomfort is on the outside of her vagina near her labia minora.  No new vaginal discharge.  No fevers or chills.  Denies nausea vomiting.  She has not called her gynecologist yet for follow-up.  Symptoms are mild in severity   Past Medical History:  Diagnosis Date  . Anxiety   . HOH (hard of hearing)   . Migraine   . Stroke [facility])     Patient Active Problem List   Diagnosis Date Noted  . History of kidney stones 07/11/2018  . Sleep apnea 11/03/2016  . Anxiety 11/03/2016  . TMJ (temporomandibular joint disorder) 10/11/2016  . PVC (premature ventricular contraction) 03/16/2016  . Palpitations 03/16/2016  . Sensorineural hearing loss (SNHL), bilateral 01/04/2016  . Facet arthropathy 12/29/2014  . DDD (degenerative disc disease), lumbar 12/29/2014  . Cervical spine fracture (HCC) 03/25/2014    Past Surgical History:  Procedure Laterality Date  . ABDOMINAL HYSTERECTOMY    . BLADDER SURGERY       OB History   None      Home Medications    Prior to Admission medications   Medication Sig Start Date End Date Taking? Authorizing Provider  butalbital-acetaminophen-caffeine (FIORICET, ESGIC) 50-325-40 MG tablet TK 1 T Q 4-6 H PRF HEADACHE 06/30/18  Yes [provider]  ALPRAZolam (XANAX PO) Take by mouth.    [provider]  aspirin EC 81 MG tablet Take by mouth. 02/14/17   [provider]  estradiol (ESTRACE VAGINAL) 0.1 MG/GM vaginal cream Place 1  Applicatorful vaginally at bedtime. 09/02/18   Azalia Bilis, MD  fluticasone (FLONASE) 50 MCG/ACT nasal spray 1 spray by Each Nare route daily for 10 days. 11/07/17   [provider]  HYDROcodone-acetaminophen (NORCO/VICODIN) 5-325 MG tablet Take 1 tablet by mouth every 6 (six) hours as needed. 07/05/18   Raeford Razor, MD  loratadine (CLARITIN) 10 MG tablet Take by mouth. 11/07/17   [provider]  metoprolol succinate (TOPROL XL) 25 MG 24 hr tablet Take by mouth. 11/04/16   [provider]  naproxen (NAPROSYN) 500 MG tablet Take by mouth. 05/27/18   [provider]    Family History No family history on file.  Social History Social History   Tobacco Use  . Smoking status: Never Smoker  . Smokeless tobacco: Never Used  Substance Use Topics  . Alcohol use: No    Frequency: Never  . Drug use: No     Allergies   Codeine and Red dye   Review of Systems Review of Systems  All other systems reviewed and are negative.    Physical Exam Updated Vital Signs BP (!) 145/78 (BP Location: Left Arm)   Pulse 77   Temp 98.4 F (36.9 C) (Oral)   Resp 20   Ht 5\' 7"  (1.702 m)   Wt 60.7 kg   SpO2 99%   BMI 20.96 kg/m   Physical Exam  Constitutional: She is oriented to person, place, and time.  She appears well-developed and well-nourished.  HENT:  Head: Normocephalic.  Eyes: EOM are normal.  Neck: Normal range of motion.  Pulmonary/Chest: Effort normal.  Abdominal: She exhibits no distension.  Genitourinary:  Genitourinary Comments: Chaperone present.  Examined in frog-leg position.  No external vaginal lesions noted.  No frank vaginal discharge.  Labia minora appear normal.  Musculoskeletal: Normal range of motion.  Neurological: She is alert and oriented to person, place, and time.  Psychiatric: She has a normal mood and affect.  Nursing note and vitals reviewed.    ED Treatments / Results  Labs (all labs ordered are listed, but only  abnormal results are displayed) Labs Reviewed  URINALYSIS, ROUTINE W REFLEX MICROSCOPIC    EKG None  Radiology No results found.  Procedures Procedures (including critical care time)  Medications Ordered in ED Medications - No data to display   Initial Impression / Assessment and Plan / ED Course  I have reviewed the triage vital signs and the nursing notes.  Pertinent labs & imaging results that were available during my care of the patient were reviewed by me and considered in my medical decision making (see chart for details).     Suspect atrophic vaginitis.  Patient will be prescribed and estrogen-based vaginal cream and instructed to follow-up with her gynecologist.  Overall well-appearing.  Final Clinical Impressions(s) / ED Diagnoses   Final diagnoses:  Vaginal burning    ED Discharge Orders         Ordered    estradiol (ESTRACE VAGINAL) 0.1 MG/GM vaginal cream  Daily at bedtime     09/02/18 1256           Azalia Bilisampos, Windi Toro, MD 09/02/18 1300

## 2018-09-02 NOTE — ED Notes (Signed)
Burning sensation during urination.  "It hurts when I pee all the time."

## 2018-09-02 NOTE — Discharge Instructions (Addendum)
Please call your gynecologist for follow-up °

## 2018-09-02 NOTE — ED Triage Notes (Addendum)
States she has a yeast infection. Back pain.

## 2018-09-02 NOTE — ED Notes (Signed)
ED Provider at bedside. 

## 2018-11-03 ENCOUNTER — Emergency Department (HOSPITAL_BASED_OUTPATIENT_CLINIC_OR_DEPARTMENT_OTHER): Payer: Medicare HMO

## 2018-11-03 ENCOUNTER — Emergency Department (HOSPITAL_BASED_OUTPATIENT_CLINIC_OR_DEPARTMENT_OTHER)
Admission: EM | Admit: 2018-11-03 | Discharge: 2018-11-03 | Disposition: A | Discharge status: Home / Self Care | Payer: Medicare HMO | Attending: Emergency Medicine | Admitting: Emergency Medicine

## 2018-11-03 ENCOUNTER — Encounter (HOSPITAL_BASED_OUTPATIENT_CLINIC_OR_DEPARTMENT_OTHER): Payer: Self-pay | Admitting: *Deleted

## 2018-11-03 ENCOUNTER — Other Ambulatory Visit: Payer: Self-pay

## 2018-11-03 DIAGNOSIS — N3000 Acute cystitis without hematuria: Secondary | ICD-10-CM | POA: Diagnosis not present

## 2018-11-03 DIAGNOSIS — R079 Chest pain, unspecified: Secondary | ICD-10-CM | POA: Insufficient documentation

## 2018-11-03 DIAGNOSIS — R51 Headache: Secondary | ICD-10-CM | POA: Diagnosis present

## 2018-11-03 LAB — URINALYSIS, ROUTINE W REFLEX MICROSCOPIC
BILIRUBIN URINE: NEGATIVE
GLUCOSE, UA: NEGATIVE mg/dL
Hgb urine dipstick: NEGATIVE
KETONES UR: NEGATIVE mg/dL
NITRITE: POSITIVE — AB
PH: 6.5 (ref 5.0–8.0)
Protein, ur: NEGATIVE mg/dL
Specific Gravity, Urine: <1.005 — ABNORMAL LOW (ref 1.005–1.030)

## 2018-11-03 LAB — COMPREHENSIVE METABOLIC PANEL
ALK PHOS: 87 U/L (ref 38–126)
ALT: 15 U/L (ref 0–44)
ANION GAP: 8 (ref 5–15)
AST: 18 U/L (ref 15–41)
Albumin: 4.1 g/dL (ref 3.5–5.0)
BUN: 7 mg/dL — ABNORMAL LOW (ref 8–23)
CALCIUM: 9 mg/dL (ref 8.9–10.3)
CHLORIDE: 106 mmol/L (ref 98–111)
CO2: 25 mmol/L (ref 22–32)
Creatinine, Ser: 0.75 mg/dL (ref 0.44–1.00)
GFR calc non Af Amer: >60 mL/min (ref >60)
Glucose, Bld: 102 mg/dL — ABNORMAL HIGH (ref 70–99)
Potassium: 3.4 mmol/L — ABNORMAL LOW (ref 3.5–5.1)
SODIUM: 139 mmol/L (ref 135–145)
Total Bilirubin: 0.5 mg/dL (ref 0.3–1.2)
Total Protein: 6.7 g/dL (ref 6.5–8.1)

## 2018-11-03 LAB — CBC
HCT: 44.2 % (ref 36.0–46.0)
HEMOGLOBIN: 13.8 g/dL (ref 12.0–15.0)
MCH: 29.4 pg (ref 26.0–34.0)
MCHC: 31.2 g/dL (ref 30.0–36.0)
MCV: 94 fL (ref 80.0–100.0)
NRBC: 0 % (ref 0.0–0.2)
Platelets: 368 10*3/uL (ref 150–400)
RBC: 4.7 MIL/uL (ref 3.87–5.11)
RDW: 13.2 % (ref 11.5–15.5)
WBC: 7.4 10*3/uL (ref 4.0–10.5)

## 2018-11-03 LAB — APTT: APTT: 28 s (ref 24–36)

## 2018-11-03 LAB — TROPONIN I: Troponin I: <0.03 ng/mL (ref <0.03)

## 2018-11-03 LAB — I-STAT CG4 LACTIC ACID, ED: LACTIC ACID, VENOUS: 1.27 mmol/L (ref 0.5–1.9)

## 2018-11-03 LAB — URINALYSIS, MICROSCOPIC (REFLEX)

## 2018-11-03 LAB — PROTIME-INR
INR: 1.01
PROTHROMBIN TIME: 13.2 s (ref 11.4–15.2)

## 2018-11-03 LAB — LIPASE, BLOOD: Lipase: 32 U/L (ref 11–51)

## 2018-11-03 LAB — OCCULT BLOOD X 1 CARD TO LAB, STOOL: Fecal Occult Bld: NEGATIVE

## 2018-11-03 MED ORDER — PROCHLORPERAZINE EDISYLATE 10 MG/2ML IJ SOLN
5.0000 mg | Freq: Once | INTRAMUSCULAR | Status: AC
  Administered 2018-11-03: 5 mg via INTRAVENOUS
  Filled 2018-11-03: qty 2

## 2018-11-03 MED ORDER — CEPHALEXIN 500 MG PO CAPS: 500.0000 mg | ORAL_CAPSULE | Freq: Three times a day (TID) | ORAL | 0 refills | Status: AC

## 2018-11-03 MED ORDER — IOPAMIDOL (ISOVUE-300) INJECTION 61%
100.0000 mL | Freq: Once | INTRAVENOUS | Status: AC | PRN
  Administered 2018-11-03: 100 mL via INTRAVENOUS

## 2018-11-03 MED ORDER — DIPHENHYDRAMINE HCL 50 MG/ML IJ SOLN
25.0000 mg | Freq: Once | INTRAMUSCULAR | Status: AC
  Administered 2018-11-03: 25 mg via INTRAVENOUS
  Filled 2018-11-03: qty 1

## 2018-11-03 NOTE — ED Notes (Signed)
Pt transported to radiology.

## 2018-11-03 NOTE — ED Provider Notes (Signed)
[facility] Emergency Department Provider Note MRN:  [id number]  Arrival date & time: 11/03/18     Chief Complaint   Headache   History of Present Illness   Shannon Wise is a 84 y.o. year-old female with a history of migraine, stroke presenting to the ED with chief complaint of headache.  Gradual onset headache that began 3 weeks ago, unrelenting despite normal migraine medications.  Her migraines does not normally last this long.  Also endorsing lower abdominal pain that began yesterday morning associated with black stools this morning.  Denies fever, no nausea or vomiting.  Endorsing intermittent chest pain for the past 2 or 3 days, at times on the left side of the chest, then migrating to the right side of the chest.  Review of Systems  A complete 10 system review of systems was obtained and all systems are negative except as noted in the HPI and PMH.   Patient's Health History    Past Medical History:  Diagnosis Date  . Anxiety   . HOH (hard of hearing)   . Migraine   . Stroke [facility](HCC)     Past Surgical History:  Procedure Laterality Date  . ABDOMINAL HYSTERECTOMY    . BLADDER SURGERY      History reviewed. No pertinent family history.  Social History   Socioeconomic History  . Marital status: Divorced    Spouse name: Not on file  . Number of children: Not on file  . Years of education: Not on file  . Highest education level: Not on file  Occupational History  . Not on file  Social Needs  . Financial resource strain: Not on file  . Food insecurity:    Worry: Not on file    Inability: Not on file  . Transportation needs:    Medical: Not on file    Non-medical: Not on file  Tobacco Use  . Smoking status: Never Smoker  . Smokeless tobacco: Never Used  Substance and Sexual Activity  . Alcohol use: No    Frequency: Never  . Drug use: No  . Sexual activity: Not on file  Lifestyle  . Physical activity:    Days per week: Not on  file    Minutes per session: Not on file  . Stress: Not on file  Relationships  . Social connections:    Talks on phone: Not on file    Gets together: Not on file    Attends religious service: Not on file    Active member of club or organization: Not on file    Attends meetings of clubs or organizations: Not on file    Relationship status: Not on file  . Intimate partner violence:    Fear of current or ex partner: Not on file    Emotionally abused: Not on file    Physically abused: Not on file    Forced sexual activity: Not on file  Other Topics Concern  . Not on file  Social History Narrative  . Not on file     Physical Exam  Vital Signs and Nursing Notes reviewed Vitals:   11/03/18 1556 11/03/18 1859  BP: (!) 144/99 138/86  Pulse: 85 76  Resp: 18 16  Temp: 98.2 F (36.8 C)   SpO2: 100% 99%    CONSTITUTIONAL: Well-appearing, NAD NEURO:  Alert and oriented x 3, no focal deficits EYES:  eyes equal and reactive ENT/NECK:  no LAD, no JVD CARDIO: Regular rate, well-perfused, normal  S1 and S2 PULM:  CTAB no wheezing or rhonchi GI/GU:  normal bowel sounds, non-distended, non-tender MSK/SPINE:  No gross deformities, no edema SKIN:  no rash, atraumatic PSYCH:  Appropriate speech and behavior  Diagnostic and Interventional Summary    EKG Interpretation  Date/Time:    Ventricular Rate:    PR Interval:    QRS Duration:   QT Interval:    QTC Calculation:   R Axis:     Text Interpretation:        Labs Reviewed  URINALYSIS, ROUTINE W REFLEX MICROSCOPIC - Abnormal; Notable for the following components:      Result Value   Specific Gravity, Urine <1.005 (*)    Nitrite POSITIVE (*)    Leukocytes, UA TRACE (*)    All other components within normal limits  URINALYSIS, MICROSCOPIC (REFLEX) - Abnormal; Notable for the following components:   Bacteria, UA MANY (*)    All other components within normal limits  COMPREHENSIVE METABOLIC PANEL - Abnormal; Notable for the  following components:   Potassium 3.4 (*)    Glucose, Bld 102 (*)    BUN 7 (*)    All other components within normal limits  URINE CULTURE  CBC  TROPONIN I  OCCULT BLOOD X 1 CARD TO LAB, STOOL  PROTIME-INR  APTT  LIPASE, BLOOD  I-STAT CG4 LACTIC ACID, ED    DG Chest 2 View  Final Result    CT ABDOMEN PELVIS W CONTRAST  Final Result    CT HEAD WO CONTRAST  Final Result      Medications  diphenhydrAMINE (BENADRYL) injection 25 mg (25 mg Intravenous Given 11/03/18 1712)  prochlorperazine (COMPAZINE) injection 5 mg (5 mg Intravenous Given 11/03/18 1713)  iopamidol (ISOVUE-300) 61 % injection 100 mL (100 mLs Intravenous Contrast Given 11/03/18 1916)     Procedures Critical Care  ED Course and Medical Decision Making  I have reviewed the triage vital signs and the nursing notes.  Pertinent labs & imaging results that were available during my care of the patient were reviewed by me and considered in my medical decision making (see below for details).  Persistent headache in this 84 year old female, normal neurological exam, also complaining of lower abdominal pain with black stools.  Hemoccult pending, will evaluate with CT Noncon of the head to exclude neoplastic process.  Will evaluate with CT of the abdomen to exclude diverticular disease.  CT head with no acute process, patient's headache is resolved.  Continues to have a reassuring neurological exam.  CT abdomen with no acute process, no diverticulitis.  Urinalysis consistent with infection.  Patient is feeling better and requesting discharge, well-appearing, vital signs stable during her entire ED visit.  Will follow with PCP.  After the discussed management above, the patient was determined to be safe for discharge.  The patient was in agreement with this plan and all questions regarding their care were answered.  ED return precautions were discussed and the patient will return to the ED with any significant worsening of  condition.  Elmer Sow. Pilar Plate, MD [facility] Emergency Medicine [facility] [email or web address]  Final Clinical Impressions(s) / ED Diagnoses     ICD-10-CM   1. Acute cystitis without hematuria N30.00   2. Chest pain R07.9 DG Chest 2 View    DG Chest 2 View    ED Discharge Orders         Ordered    cephALEXin (KEFLEX) 500 MG capsule  3 times daily  11/03/18 2053             Sabas Sous, MD 11/03/18 2054

## 2018-11-03 NOTE — ED Notes (Signed)
Ambulatory to restroom

## 2018-11-03 NOTE — ED Notes (Signed)
Pt states her headache has resolved, visitor is going back out to waiting room because the chairs are making her back hurt

## 2018-11-03 NOTE — ED Triage Notes (Signed)
Pt c/o h/a x 3 weeks , also black tarry stools x 1 day with diffuse abd pain

## 2018-11-03 NOTE — ED Notes (Signed)
Updated pt and visitor to plan of care, both seem to be hard of hearing

## 2018-11-03 NOTE — ED Notes (Signed)
Ambulatory to rest room with minimal assistance  °

## 2018-11-03 NOTE — ED Notes (Signed)
Pt c/o headache for three weeks, hx of headaches, also had two bowel movements today that were dark.

## 2018-11-03 NOTE — Discharge Instructions (Addendum)
You were evaluated in the Emergency Department and after careful evaluation, we did not find any emergent condition requiring admission or further testing in the hospital.  Your symptoms today seem to be due to a urinary tract infection.  Please take the antibiotic as prescribed and follow-up with your primary care provider.  Please return to the Emergency Department if you experience any worsening of your condition.  We encourage you to follow up with a primary care provider.  Thank you for allowing us to be a part of your care.

## 2018-11-05 LAB — URINE CULTURE: Culture: >=100000 — AB

## 2018-11-06 ENCOUNTER — Telehealth: Payer: Self-pay | Admitting: *Deleted

## 2018-11-06 NOTE — Telephone Encounter (Signed)
Post ED Visit - Positive Culture Follow-up  Culture report reviewed by antimicrobial stewardship pharmacist:  []  Enzo BiNathan Batchelder, Pharm.D. []  Celedonio MiyamotoJeremy Frens, Pharm.D., BCPS AQ-ID [x]  Garvin FilaMike Maccia, Pharm.D., BCPS []  Georgina PillionElizabeth Martin, [address]Pharm[address]D., BCPS []  SaranapMinh Pham, Vermont[place]arm.D., BCPS, AAHIVP []  Estella HuskMichelle Turner, Pharm.D., BCPS, AAHIVP []  Lysle Pearlachel Rumbarger, PharmD, BCPS []  Phillips Climeshuy Dang, PharmD, BCPS []  Agapito GamesAlison Masters, PharmD, BCPS []  Verlan FriendsErin Deja, PharmD  Positive urine culture Treated with Cephalexin, organism sensitive to the same and no further patient follow-up is required at this time.  Virl AxeRobertson, Vandy Fong [facility] 11/06/2018, 9:34 AM

## 2018-11-16 ENCOUNTER — Encounter (HOSPITAL_BASED_OUTPATIENT_CLINIC_OR_DEPARTMENT_OTHER): Payer: Self-pay | Admitting: Emergency Medicine

## 2018-11-16 ENCOUNTER — Emergency Department (HOSPITAL_BASED_OUTPATIENT_CLINIC_OR_DEPARTMENT_OTHER): Payer: Medicare HMO

## 2018-11-16 ENCOUNTER — Other Ambulatory Visit: Payer: Self-pay

## 2018-11-16 ENCOUNTER — Emergency Department (HOSPITAL_BASED_OUTPATIENT_CLINIC_OR_DEPARTMENT_OTHER)
Admission: EM | Admit: 2018-11-16 | Discharge: 2018-11-16 | Disposition: A | Discharge status: Home / Self Care | Payer: Medicare HMO | Attending: Emergency Medicine | Admitting: Emergency Medicine

## 2018-11-16 DIAGNOSIS — M79605 Pain in left leg: Secondary | ICD-10-CM | POA: Diagnosis not present

## 2018-11-16 DIAGNOSIS — M79604 Pain in right leg: Secondary | ICD-10-CM | POA: Diagnosis not present

## 2018-11-16 DIAGNOSIS — Z79899 Other long term (current) drug therapy: Secondary | ICD-10-CM | POA: Diagnosis not present

## 2018-11-16 DIAGNOSIS — Z8673 Personal history of transient ischemic attack (TIA), and cerebral infarction without residual deficits: Secondary | ICD-10-CM | POA: Insufficient documentation

## 2018-11-16 DIAGNOSIS — Z7982 Long term (current) use of aspirin: Secondary | ICD-10-CM | POA: Insufficient documentation

## 2018-11-16 MED ORDER — IBUPROFEN 400 MG PO TABS
400.0000 mg | ORAL_TABLET | Freq: Once | ORAL | Status: AC
  Administered 2018-11-16: 400 mg via ORAL
  Filled 2018-11-16: qty 1

## 2018-11-16 MED ORDER — IBUPROFEN 400 MG PO TABS: 400.0000 mg | ORAL_TABLET | Freq: Four times a day (QID) | ORAL | 0 refills | Status: DC | PRN

## 2018-11-16 NOTE — ED Triage Notes (Signed)
Bilateral leg pain for over a week from the knee down. Pt has been unable to walk. Family reports concern about pt falling.

## 2018-11-16 NOTE — ED Notes (Signed)
Patient transported to X-ray 

## 2018-11-16 NOTE — ED Provider Notes (Signed)
[facility] EMERGENCY DEPARTMENT Provider Note   CSN: [id number] Arrival date & time: 11/16/18  1124     History   Chief Complaint Chief Complaint  Patient presents with  . Leg Pain    HPI Shannon Wise is a 84 y.o. female with a hx of anxiety, migraines, prior CVA, DDD, and anxiety who presents to the ED with complaints of bilateral knee pain x 1 week. Patient states she is having pain to the lower back and bilateral lower extremities, but pain is most prominent in the knees. She states pain is fairly constant, worse with ambulation, no alleviating factors. Tylenol without relief at home. She states over past 3 days the knees have felt that they are buckling making her feel hesitant to ambulate out of fear of falling. She states she gets a sharp pain to the knee and it will buckle. She has not had any falls. No other specific alleviating/aggravating factors. She walks with a cane and lives at home with her daughter. Denies numbness, weakness, or coolness to the legs. Denies incontinence/saddle anesthesia. Denies prior hx of cancer. Denies swelling or recent injury/activity change. She has a hx of knee and back pain in the past.   HPI  Past Medical History:  Diagnosis Date  . Anxiety   . HOH (hard of hearing)   . Migraine   . Stroke [facility](HCC)     Patient Active Problem List   Diagnosis Date Noted  . History of kidney stones 07/11/2018  . Sleep apnea 11/03/2016  . Anxiety 11/03/2016  . TMJ (temporomandibular joint disorder) 10/11/2016  . PVC (premature ventricular contraction) 03/16/2016  . Palpitations 03/16/2016  . Sensorineural hearing loss (SNHL), bilateral 01/04/2016  . Facet arthropathy 12/29/2014  . DDD (degenerative disc disease), lumbar 12/29/2014  . Cervical spine fracture (HCC) 03/25/2014    Past Surgical History:  Procedure Laterality Date  . ABDOMINAL HYSTERECTOMY    . BLADDER SURGERY       OB History   None      Home Medications    Prior  to Admission medications   Medication Sig Start Date End Date Taking? Authorizing Provider  ALPRAZolam (XANAX PO) Take by mouth.    [provider]  aspirin EC 81 MG tablet Take by mouth. 02/14/17   [provider]  butalbital-acetaminophen-caffeine (FIORICET, ESGIC) 50-325-40 MG tablet TK 1 T Q 4-6 H PRF HEADACHE 06/30/18   [provider]  estradiol (ESTRACE VAGINAL) 0.1 MG/GM vaginal cream Place 1 Applicatorful vaginally at bedtime. 09/02/18   Azalia Bilisampos, Kevin, MD  fluticasone (FLONASE) 50 MCG/ACT nasal spray 1 spray by Each Nare route daily for 10 days. 11/07/17   [provider]  HYDROcodone-acetaminophen (NORCO/VICODIN) 5-325 MG tablet Take 1 tablet by mouth every 6 (six) hours as needed. 07/05/18   Raeford RazorKohut, Stephen, MD  loratadine (CLARITIN) 10 MG tablet Take by mouth. 11/07/17   [provider]  metoprolol succinate (TOPROL XL) 25 MG 24 hr tablet Take by mouth. 11/04/16   [provider]  naproxen (NAPROSYN) 500 MG tablet Take by mouth. 05/27/18   [provider]    Family History History reviewed. No pertinent family history.  Social History Social History   Tobacco Use  . Smoking status: Never Smoker  . Smokeless tobacco: Never Used  Substance Use Topics  . Alcohol use: No    Frequency: Never  . Drug use: No     Allergies   Codeine and Red dye   Review of  Systems Review of Systems  Constitutional: Negative for fever.  Musculoskeletal: Positive for arthralgias, back pain and myalgias.  Neurological: Negative for weakness and numbness.       Negative for incontinence./saddle anesthesia.   All other systems reviewed and are negative.    Physical Exam Updated Vital Signs BP (!) 133/93 (BP Location: Right Arm)   Pulse 78   Temp 98.1 F (36.7 C) (Oral)   Resp 18   Ht 5' 5.5" (1.664 m)   Wt 61.2 kg   SpO2 100%   BMI 22.12 kg/m   Physical Exam  Constitutional: She appears well-developed and well-nourished.   Non-toxic appearance. No distress.  HENT:  Head: Normocephalic and atraumatic.  Eyes: Conjunctivae are normal. Right eye exhibits no discharge. Left eye exhibits no discharge.  Neck: Neck supple.  Cardiovascular: Normal rate and regular rhythm.  Pulses:      Dorsalis pedis pulses are 2+ on the right side, and 2+ on the left side.       Posterior tibial pulses are 2+ on the right side, and 2+ on the left side.  Pulmonary/Chest: Effort normal and breath sounds normal. No respiratory distress. She has no wheezes. She has no rhonchi. She has no rales.  Respiration even and unlabored  Abdominal: Soft. She exhibits no distension. There is no tenderness.  Musculoskeletal:  No obvious deformity, appreciable swelling, erythema, ecchymosis, or open wounds.  Back: diffuse tenderness to lumbar area including midline and paraspinal region without point/focal vertebral tenderness. Lower extremities: Intact AROM to bilateral hips, knees, ankles, and all digits. Diffuse tenderness without point/focal tenderness to palpation.   Neurological: She is alert.  Clear speech. 5/5 strength that is symmetric to all bilateral lower extremity muscle groups. 5/5 symmetric grip strength. Sensation grossly intact to bilateral upper/lower extremities. Patellar DTRs are 2+ and symmetric. Patient able to transition from sitting to standing, attempted to take a step but R knee reportedly buckled with sharp pain to the knee therefore she sat back down.   Skin: Skin is warm and dry. Capillary refill takes less than 2 seconds. No rash noted.  Psychiatric: She has a normal mood and affect. Her behavior is normal.  Nursing note and vitals reviewed.    ED Treatments / Results  Labs (all labs ordered are listed, but only abnormal results are displayed) Labs Reviewed - No data to display  EKG None  Radiology Dg Lumbar Spine Complete  Result Date: 11/16/2018 CLINICAL DATA:  Back and left lower extremity pain. EXAM: LUMBAR  SPINE - COMPLETE 4+ VIEW COMPARISON:  CT scan 11/03/2018 FINDINGS: Thoracolumbar scoliosis with advanced degenerative lumbar spondylosis and multilevel disc disease and facet disease. No acute bony findings or destructive bony changes. No definite pars defects. Stable aortoiliac calcifications. The visualized bony pelvis is intact. IMPRESSION: Scoliosis and degenerative lumbar spondylosis with multilevel disc disease and facet disease but no definite acute bony findings. Electronically Signed   By: Rudie Meyer M.D.   On: 11/16/2018 16:55   Dg Knee Complete 4 Views Left  Result Date: 11/16/2018 CLINICAL DATA:  Left knee pain. EXAM: LEFT KNEE - COMPLETE 4+ VIEW COMPARISON:  None. FINDINGS: Mild tricompartmental degenerative changes for age. No acute fracture or osteochondral lesion. Suspect small joint effusion. Vascular calcifications noted. IMPRESSION: Mild degenerative changes but no acute bony findings. Small joint effusion. Electronically Signed   By: Rudie Meyer M.D.   On: 11/16/2018 16:58   Dg Knee Complete 4 Views Right  Result Date: 11/16/2018 CLINICAL DATA:  Right knee pain. EXAM: RIGHT KNEE - COMPLETE 4+ VIEW COMPARISON:  None. FINDINGS: Mild tricompartmental degenerative changes for age. Early joint space narrowing and spurring. No fracture, osteochondral lesion, erosions or chondrocalcinosis. No joint effusion. Minimal vascular calcifications. IMPRESSION: Mild degenerative changes but no acute bony findings. Electronically Signed   By: Rudie Meyer M.D.   On: 11/16/2018 16:58   Dg Hips Bilat W Or Wo Pelvis 2 Views  Result Date: 11/16/2018 CLINICAL DATA:  Bilateral hip pain. EXAM: DG HIP (WITH OR WITHOUT PELVIS) 2V BILAT COMPARISON:  None. FINDINGS: Both hips are normally located. Minimal degenerative changes for age. No plain film evidence of avascular necrosis. The pubic symphysis and SI joints are intact. No definite pubic rami fractures. IMPRESSION: No acute bony findings.  Electronically Signed   By: Rudie Meyer M.D.   On: 11/16/2018 16:57    Procedures Procedures (including critical care time)  Medications Ordered in ED Medications - No data to display   Initial Impression / Assessment and Plan / ED Course  I have reviewed the triage vital signs and the nursing notes.  Pertinent labs & imaging results that were available during my care of the patient were reviewed by me and considered in my medical decision making (see chart for details).   Patient presents to the ED with back and lower extremity pain x 1 week. Hx of back pain w/ DDD and prior knee issues. ROM intact. No overlying erythema/warmth to suggest infectious cause. Exam with diffuse tenderness, non focal. Sensation, strength, and reflexes are normal making significant spinal pathology less likely. Good distal pulses, warm to touch, does not seem ischemic. Plain films w/ degenerative changes. Discussed with supervising physician Dr. Juleen China- advises 400 mg ibuprofen (short course of NSAIDs at home) last renal function reassuring, with trial of ambulation with walker and likely discharge home. Patient amble to ambulate without difficulty with a walker. She appears safe for discharge with PCP follow up. Her daughter will obtain walker from another family member. I discussed results, treatment plan, need for follow-up, and return precautions with the patient and her daughter at bedside. Provided opportunity for questions, patient and her daughter confirmed understanding and are in agreement with plan.   Final Clinical Impressions(s) / ED Diagnoses   Final diagnoses:  Leg pain, bilateral    ED Discharge Orders         Ordered    ibuprofen (ADVIL,MOTRIN) 400 MG tablet  Every 6 hours PRN     11/16/18 1745           Cherly Anderson, PA-C 11/16/18 1746    Raeford Razor, MD 11/20/18 [phone]

## 2018-11-16 NOTE — Discharge Instructions (Addendum)
You were seen in the ER for back and lower extremity pain. Your xrays showed degenerative changes. Please use a walker. Please take motrin as prescribed.   We have prescribed you new medication(s) today. Discuss the medications prescribed today with your pharmacist as they can have adverse effects and interactions with your other medicines including over the counter and prescribed medications. Seek medical evaluation if you start to experience new or abnormal symptoms after taking one of these medicines, seek care immediately if you start to experience difficulty breathing, feeling of your throat closing, facial swelling, or rash as these could be indications of a more serious allergic reaction  Follow up with your primary care within 3 days. If you do not have primary care please call the circled number. Return to the ER for new or worsening symptoms or any other concerns.

## 2018-12-10 ENCOUNTER — Other Ambulatory Visit: Payer: Self-pay

## 2018-12-10 ENCOUNTER — Emergency Department (HOSPITAL_BASED_OUTPATIENT_CLINIC_OR_DEPARTMENT_OTHER)
Admission: EM | Admit: 2018-12-10 | Discharge: 2018-12-10 | Disposition: A | Discharge status: Home / Self Care | Payer: Medicare HMO | Attending: Emergency Medicine | Admitting: Emergency Medicine

## 2018-12-10 ENCOUNTER — Encounter (HOSPITAL_BASED_OUTPATIENT_CLINIC_OR_DEPARTMENT_OTHER): Payer: Self-pay

## 2018-12-10 DIAGNOSIS — Z8673 Personal history of transient ischemic attack (TIA), and cerebral infarction without residual deficits: Secondary | ICD-10-CM | POA: Insufficient documentation

## 2018-12-10 DIAGNOSIS — Z79899 Other long term (current) drug therapy: Secondary | ICD-10-CM | POA: Insufficient documentation

## 2018-12-10 DIAGNOSIS — M25561 Pain in right knee: Secondary | ICD-10-CM | POA: Diagnosis present

## 2018-12-10 DIAGNOSIS — F419 Anxiety disorder, unspecified: Secondary | ICD-10-CM | POA: Diagnosis not present

## 2018-12-10 DIAGNOSIS — M79604 Pain in right leg: Secondary | ICD-10-CM

## 2018-12-10 DIAGNOSIS — Z7982 Long term (current) use of aspirin: Secondary | ICD-10-CM | POA: Diagnosis not present

## 2018-12-10 DIAGNOSIS — M79605 Pain in left leg: Secondary | ICD-10-CM | POA: Diagnosis not present

## 2018-12-10 DIAGNOSIS — M5416 Radiculopathy, lumbar region: Secondary | ICD-10-CM

## 2018-12-10 MED ORDER — LIDOCAINE 5 % EX PTCH: 1.0000 | MEDICATED_PATCH | CUTANEOUS | 0 refills | Status: DC

## 2018-12-10 MED ORDER — PREDNISONE 20 MG PO TABS: 40.0000 mg | ORAL_TABLET | Freq: Every day | ORAL | 0 refills | Status: DC

## 2018-12-10 MED ORDER — DICLOFENAC SODIUM 1 % TD GEL: 2.0000 g | Freq: Four times a day (QID) | TRANSDERMAL | 1 refills | Status: DC

## 2018-12-10 NOTE — ED Provider Notes (Signed)
[facility] EMERGENCY DEPARTMENT Provider Note   CSN: [id number] Arrival date & time: 12/10/18  1419     History   Chief Complaint Chief Complaint  Patient presents with  . Leg Pain    HPI Shannon [facility] is a 85 y.o. female.  The history is provided by the patient.  Leg Pain   This is a new problem. Episode onset: 3 weeks ago. The problem occurs constantly. The problem has not changed since onset.The pain is present in the right knee, left knee, left lower leg and right lower leg. The quality of the pain is described as constant. The pain is at a severity of 8/10. The pain is severe. Pertinent negatives include no numbness, full range of motion, no stiffness and no tingling. Associated symptoms comments: No fever, abdominal pain, back pain, urinary complaints, bowel changes.  No recent medication changes.  No color change or tingling to the legs.  Pain is worse when attempting to walk.  Using a walker and able to ambulate but Tylenol is not working.. The symptoms are aggravated by activity. Treatments tried: tylenol. The treatment provided no relief. There has been no history of extremity trauma. Family history is significant for no rheumatoid arthritis and no gout. hx of DDD, scoliosis    Past Medical History:  Diagnosis Date  . Anxiety   . HOH (hard of hearing)   . Migraine   . Stroke [facility])     Patient Active Problem List   Diagnosis Date Noted  . History of kidney stones 07/11/2018  . Sleep apnea 11/03/2016  . Anxiety 11/03/2016  . TMJ (temporomandibular joint disorder) 10/11/2016  . PVC (premature ventricular contraction) 03/16/2016  . Palpitations 03/16/2016  . Sensorineural hearing loss (SNHL), bilateral 01/04/2016  . Facet arthropathy 12/29/2014  . DDD (degenerative disc disease), lumbar 12/29/2014  . Cervical spine fracture (HCC) 03/25/2014    Past Surgical History:  Procedure Laterality Date  . ABDOMINAL HYSTERECTOMY    . BLADDER SURGERY        OB History   No obstetric history on file.      Home Medications    Prior to Admission medications   Medication Sig Start Date End Date Taking? Authorizing Provider  ALPRAZolam (XANAX PO) Take by mouth.    [provider]  aspirin EC 81 MG tablet Take by mouth. 02/14/17   [provider]  butalbital-acetaminophen-caffeine (FIORICET, ESGIC) 50-325-40 MG tablet TK 1 T Q 4-6 H PRF HEADACHE 06/30/18   [provider]  diclofenac sodium (VOLTAREN) 1 % GEL Apply 2 g topically 4 (four) times daily. Apply to the area on your knees that hurt 12/10/18   Gwyneth Sprout, MD  estradiol (ESTRACE VAGINAL) 0.1 MG/GM vaginal cream Place 1 Applicatorful vaginally at bedtime. 09/02/18   Azalia Bilis, MD  fluticasone (FLONASE) 50 MCG/ACT nasal spray 1 spray by Each Nare route daily for 10 days. 11/07/17   [provider]  HYDROcodone-acetaminophen (NORCO/VICODIN) 5-325 MG tablet Take 1 tablet by mouth every 6 (six) hours as needed. 07/05/18   Raeford Razor, MD  ibuprofen (ADVIL,MOTRIN) 400 MG tablet Take 1 tablet (400 mg total) by mouth every 6 (six) hours as needed. 11/16/18   Petrucelli, Samantha R, PA-C  lidocaine (LIDODERM) 5 % Place 1 patch onto the skin daily. Remove & Discard patch within 12 hours or as directed by MD 12/10/18   Gwyneth Sprout, MD  loratadine (CLARITIN) 10 MG tablet Take by mouth. 11/07/17   [provider]  metoprolol succinate (TOPROL XL) 25 MG 24 hr tablet Take by mouth. 11/04/16   [provider]  naproxen (NAPROSYN) 500 MG tablet Take by mouth. 05/27/18   [provider]  predniSONE (DELTASONE) 20 MG tablet Take 2 tablets (40 mg total) by mouth daily. 12/10/18   Gwyneth Sprout, MD    Family History No family history on file.  Social History Social History   Tobacco Use  . Smoking status: Never Smoker  . Smokeless tobacco: Never Used  Substance Use Topics  . Alcohol use: No    Frequency: Never  . Drug  use: No     Allergies   Codeine and Red dye   Review of Systems Review of Systems  Musculoskeletal: Negative for stiffness.  Neurological: Negative for tingling and numbness.  All other systems reviewed and are negative.    Physical Exam Updated Vital Signs BP (!) 152/91 (BP Location: Right Arm)   Pulse 75   Temp 98.1 F (36.7 C) (Oral)   Resp 18   Ht 5\' 5"  (1.651 m)   Wt 58.7 kg   SpO2 99%   BMI 21.53 kg/m   Physical Exam Vitals signs and nursing note reviewed.  Constitutional:      General: She is not in acute distress.    Appearance: She is well-developed.  HENT:     Head: Normocephalic and atraumatic.     Mouth/Throat:     Mouth: Mucous membranes are moist.  Eyes:     Pupils: Pupils are equal, round, and reactive to light.  Cardiovascular:     Rate and Rhythm: Normal rate and regular rhythm.     Heart sounds: Normal heart sounds. No murmur. No friction rub.  Pulmonary:     Effort: Pulmonary effort is normal.     Breath sounds: Normal breath sounds. No wheezing or rales.  Abdominal:     General: Bowel sounds are normal. There is no distension.     Palpations: Abdomen is soft.     Tenderness: There is no abdominal tenderness. There is no right CVA tenderness, left CVA tenderness, guarding or rebound.  Musculoskeletal:        General: No tenderness.     Right knee: She exhibits bony tenderness. She exhibits normal range of motion, no swelling, no effusion and no erythema.     Left knee: She exhibits bony tenderness. She exhibits normal range of motion, no swelling, no effusion and no erythema.       Back:     Comments: No edema.  Patient has diffuse tenderness with palpation of bilateral knees, calves and ankles but no obvious sign of abnormality.  2+ DP and PT pulses bilateral.  Capillary refill is less than 2 seconds.  Skin:    General: Skin is warm and dry.     Findings: No rash.  Neurological:     Mental Status: She is alert and oriented to person,  place, and time.     Cranial Nerves: No cranial nerve deficit.     Sensory: Sensation is intact.     Motor: Motor function is intact.     Coordination: Coordination is intact.     Comments: 5 out of 5 strength in bilateral lower extremities.  Pain does shoot up her leg with lifting each leg separately.  Psychiatric:        Behavior: Behavior normal.      ED Treatments / Results  Labs (all labs ordered are listed, but only abnormal results are  displayed) Labs Reviewed - No data to display  EKG None  Radiology No results found.  Procedures Procedures (including critical care time)  Medications Ordered in ED Medications - No data to display   Initial Impression / Assessment and Plan / ED Course  I have reviewed the triage vital signs and the nursing notes.  Pertinent labs & imaging results that were available during my care of the patient were reviewed by me and considered in my medical decision making (see chart for details).     Pleasant elderly female returning today for ongoing pain in her bilateral lower extremities that starts at the knee and shoots down her legs.  She states it is worse when she attempts to walk so she has been using a walker.  She initially states she has no pain in her back however with palpation it is tender.  Patient was seen 2-1/2 weeks ago when her symptoms started and was told to take ibuprofen for the pain.  She states the prescription she got the tablets are too large and she cannot swallow them so she has been taking Tylenol.  She denies any urinary or abdominal symptoms.  She has no neurologic symptoms on exam and no evidence of vascular compromise.  Low suspicion for dissection, AAA, UTI or PAD.  Patient had plain films done the last time she was here that showed relatively normal knee imaging but scoliosis and multiple degenerative disc disease at multiple levels.  Suspect patient symptoms are most likely related to radiculopathy.  However no  neuro compromise at this time.  Patient does not wish to be on any type of narcotic medication but stated we would try prednisone to see if that helps as well as lidocaine patches for her knees and Voltaren gel.  She knows not to take ibuprofen in addition to prednisone due to increased risk of GI bleeding.  She will also continue to use Tylenol and follow-up with her doctor for MRI, possible physical therapy or neurosurgery referral.  Patient is with her family member in the room and they are agreeable to this plan.  Final Clinical Impressions(s) / ED Diagnoses   Final diagnoses:  Bilateral leg pain  Lumbar radiculopathy, acute    ED Discharge Orders         Ordered    predniSONE (DELTASONE) 20 MG tablet  Daily     12/10/18 1543    lidocaine (LIDODERM) 5 %  Every 24 hours     12/10/18 1543    diclofenac sodium (VOLTAREN) 1 % GEL  4 times daily     12/10/18 1543           Gwyneth SproutPlunkett, Cieanna Stormes, MD 12/10/18 1551

## 2018-12-10 NOTE — ED Triage Notes (Signed)
C/o bilat leg pain x 3 weeks-states she was seen here for same-NAD- to triage in w/c-states her daughter brought her to ED

## 2019-01-02 ENCOUNTER — Other Ambulatory Visit: Payer: Self-pay

## 2019-01-02 ENCOUNTER — Emergency Department (HOSPITAL_BASED_OUTPATIENT_CLINIC_OR_DEPARTMENT_OTHER)
Admission: EM | Admit: 2019-01-02 | Discharge: 2019-01-02 | Disposition: A | Discharge status: Home / Self Care | Payer: Medicare HMO | Attending: Emergency Medicine | Admitting: Emergency Medicine

## 2019-01-02 ENCOUNTER — Encounter (HOSPITAL_BASED_OUTPATIENT_CLINIC_OR_DEPARTMENT_OTHER): Payer: Self-pay | Admitting: Emergency Medicine

## 2019-01-02 ENCOUNTER — Emergency Department (HOSPITAL_BASED_OUTPATIENT_CLINIC_OR_DEPARTMENT_OTHER): Payer: Medicare HMO

## 2019-01-02 DIAGNOSIS — R079 Chest pain, unspecified: Secondary | ICD-10-CM | POA: Diagnosis not present

## 2019-01-02 DIAGNOSIS — Z7982 Long term (current) use of aspirin: Secondary | ICD-10-CM | POA: Diagnosis not present

## 2019-01-02 DIAGNOSIS — Z79899 Other long term (current) drug therapy: Secondary | ICD-10-CM | POA: Diagnosis not present

## 2019-01-02 LAB — BASIC METABOLIC PANEL
ANION GAP: 9 (ref 5–15)
BUN: 9 mg/dL (ref 8–23)
CO2: 26 mmol/L (ref 22–32)
Calcium: 9 mg/dL (ref 8.9–10.3)
Chloride: 104 mmol/L (ref 98–111)
Creatinine, Ser: 0.82 mg/dL (ref 0.44–1.00)
Glucose, Bld: 100 mg/dL — ABNORMAL HIGH (ref 70–99)
Potassium: 3.2 mmol/L — ABNORMAL LOW (ref 3.5–5.1)
Sodium: 139 mmol/L (ref 135–145)

## 2019-01-02 LAB — CBC
HEMATOCRIT: 45.4 % (ref 36.0–46.0)
HEMOGLOBIN: 14.2 g/dL (ref 12.0–15.0)
MCH: 29.3 pg (ref 26.0–34.0)
MCHC: 31.3 g/dL (ref 30.0–36.0)
MCV: 93.8 fL (ref 80.0–100.0)
NRBC: 0 % (ref 0.0–0.2)
Platelets: 377 10*3/uL (ref 150–400)
RBC: 4.84 MIL/uL (ref 3.87–5.11)
RDW: 12.9 % (ref 11.5–15.5)
WBC: 9.2 10*3/uL (ref 4.0–10.5)

## 2019-01-02 LAB — TROPONIN I
Troponin I: <0.03 ng/mL (ref <0.03)
Troponin I: <0.03 ng/mL (ref <0.03)

## 2019-01-02 MED ORDER — ASPIRIN 81 MG PO CHEW
324.0000 mg | CHEWABLE_TABLET | Freq: Once | ORAL | Status: AC
  Administered 2019-01-02: 324 mg via ORAL
  Filled 2019-01-02: qty 4

## 2019-01-02 MED ORDER — POTASSIUM CHLORIDE CRYS ER 20 MEQ PO TBCR
40.0000 meq | EXTENDED_RELEASE_TABLET | Freq: Once | ORAL | Status: AC
  Administered 2019-01-02: 40 meq via ORAL
  Filled 2019-01-02: qty 2

## 2019-01-02 MED ORDER — SODIUM CHLORIDE 0.9% FLUSH
3.0000 mL | Freq: Once | INTRAVENOUS | Status: AC
  Administered 2019-01-02: 3 mL via INTRAVENOUS
  Filled 2019-01-02: qty 3

## 2019-01-02 NOTE — ED Notes (Signed)
Patient transported to X-ray 

## 2019-01-02 NOTE — ED Notes (Addendum)
ED Provider at bedside discussing test results and the need for her to be admitted to the hospital.  Pt sts she does not want to be admitted but has agreed to stay a couple more hours so we can repeat the heart blood tests and make sure the levels don't go up.

## 2019-01-02 NOTE — ED Triage Notes (Signed)
Reports chest pain which began yesterday intermittently but then returned today.  Denies shortness of breath.

## 2019-01-02 NOTE — Discharge Instructions (Signed)
It was recommended you stay in the hospital for further evaluation of your heart.  While your blood tests are negative for heart attack, we cannot rule out any heart disease at all and it is recommended that you follow-up very closely with your primary care physician.  If at any point you change your mind or symptoms worsen or recur then you should return to the ER for evaluation.  If you develop recurrent, continued, or worsening chest pain, shortness of breath, fever, vomiting, abdominal or back pain, or any other new/concerning symptoms then return to the ER for evaluation.

## 2019-01-02 NOTE — ED Notes (Addendum)
ED Provider at bedside. Pt denies chest pain at this time

## 2019-01-02 NOTE — ED Provider Notes (Signed)
[facility] EMERGENCY DEPARTMENT Provider Note   CSN: [id number] Arrival date & time: 01/02/19  1457     History   Chief Complaint Chief Complaint  Patient presents with  . Chest Pain    HPI Shannon Wise is a 85 y.o. female.  HPI  85 year old female presents with chest pain.  The history is somewhat limited due to the patient's extreme hard of hearing.  The patient had an episode of chest pain yesterday that finally went away when she went to bed.  When she woke up she did not have the chest pain but it recurred around 11 AM.  It seemed to go away just prior to getting here.  It felt dull across her entire chest.  No radiation to her back or abdomen.  Some shortness of breath and some nausea without vomiting.  No obvious diaphoresis.  No recent leg swelling.  Past Medical History:  Diagnosis Date  . Anxiety   . HOH (hard of hearing)   . Migraine   . Stroke [facility](HCC)     Patient Active Problem List   Diagnosis Date Noted  . History of kidney stones 07/11/2018  . Sleep apnea 11/03/2016  . Anxiety 11/03/2016  . TMJ (temporomandibular joint disorder) 10/11/2016  . PVC (premature ventricular contraction) 03/16/2016  . Palpitations 03/16/2016  . Sensorineural hearing loss (SNHL), bilateral 01/04/2016  . Facet arthropathy 12/29/2014  . DDD (degenerative disc disease), lumbar 12/29/2014  . Cervical spine fracture (HCC) 03/25/2014    Past Surgical History:  Procedure Laterality Date  . ABDOMINAL HYSTERECTOMY    . BLADDER SURGERY       OB History   No obstetric history on file.      Home Medications    Prior to Admission medications   Medication Sig Start Date End Date Taking? Authorizing Provider  ALPRAZolam (XANAX PO) Take by mouth.    [provider]  aspirin EC 81 MG tablet Take by mouth. 02/14/17   [provider]  butalbital-acetaminophen-caffeine (FIORICET, ESGIC) 50-325-40 MG tablet TK 1 T Q 4-6 H PRF HEADACHE 06/30/18   [provider]  diclofenac sodium (VOLTAREN) 1 % GEL Apply 2 g topically 4 (four) times daily. Apply to the area on your knees that hurt 12/10/18   Gwyneth SproutPlunkett, Whitney, MD  estradiol (ESTRACE VAGINAL) 0.1 MG/GM vaginal cream Place 1 Applicatorful vaginally at bedtime. 09/02/18   Azalia Bilisampos, Kevin, MD  fluticasone (FLONASE) 50 MCG/ACT nasal spray 1 spray by Each Nare route daily for 10 days. 11/07/17   [provider]  HYDROcodone-acetaminophen (NORCO/VICODIN) 5-325 MG tablet Take 1 tablet by mouth every 6 (six) hours as needed. 07/05/18   Raeford RazorKohut, Stephen, MD  ibuprofen (ADVIL,MOTRIN) 400 MG tablet Take 1 tablet (400 mg total) by mouth every 6 (six) hours as needed. 11/16/18   Petrucelli, Samantha R, PA-C  lidocaine (LIDODERM) 5 % Place 1 patch onto the skin daily. Remove & Discard patch within 12 hours or as directed by MD 12/10/18   Gwyneth SproutPlunkett, Whitney, MD  loratadine (CLARITIN) 10 MG tablet Take by mouth. 11/07/17   [provider]  metoprolol succinate (TOPROL XL) 25 MG 24 hr tablet Take by mouth. 11/04/16   [provider]  naproxen (NAPROSYN) 500 MG tablet Take by mouth. 05/27/18   [provider]  predniSONE (DELTASONE) 20 MG tablet Take 2 tablets (40 mg total) by mouth daily. 12/10/18   Gwyneth SproutPlunkett, Whitney, MD    Family History History reviewed. No pertinent  family history.  Social History Social History   Tobacco Use  . Smoking status: Never Smoker  . Smokeless tobacco: Never Used  Substance Use Topics  . Alcohol use: No    Frequency: Never  . Drug use: No     Allergies   Codeine and Red dye   Review of Systems Review of Systems  Respiratory: Positive for cough and shortness of breath.   Cardiovascular: Positive for chest pain. Negative for leg swelling.  Gastrointestinal: Positive for nausea. Negative for abdominal pain and vomiting.  Musculoskeletal: Positive for back pain (chronic).  All other systems reviewed and are negative.    Physical  Exam Updated Vital Signs BP 131/79   Pulse 68   Temp 97.9 F (36.6 C) (Oral)   Resp (!) 22   Ht 5\' 5"  (1.651 m)   Wt 61.2 kg   SpO2 100%   BMI 22.47 kg/m   Physical Exam Vitals signs and nursing note reviewed.  Constitutional:      General: She is not in acute distress.    Appearance: She is well-developed. She is not ill-appearing or diaphoretic.  HENT:     Head: Normocephalic and atraumatic.     Right Ear: External ear normal.     Left Ear: External ear normal.     Nose: Nose normal.  Eyes:     General:        Right eye: No discharge.        Left eye: No discharge.  Cardiovascular:     Rate and Rhythm: Normal rate and regular rhythm.     Pulses:          Radial pulses are 2+ on the right side and 2+ on the left side.     Heart sounds: Normal heart sounds.  Pulmonary:     Effort: Pulmonary effort is normal.     Breath sounds: Normal breath sounds.  Abdominal:     Palpations: Abdomen is soft.     Tenderness: There is no abdominal tenderness.  Musculoskeletal:     Right lower leg: No edema.     Left lower leg: No edema.  Skin:    General: Skin is warm and dry.  Neurological:     Mental Status: She is alert.  Psychiatric:        Mood and Affect: Mood is not anxious.      ED Treatments / Results  Labs (all labs ordered are listed, but only abnormal results are displayed) Labs Reviewed  BASIC METABOLIC PANEL - Abnormal; Notable for the following components:      Result Value   Potassium 3.2 (*)    Glucose, Bld 100 (*)    All other components within normal limits  CBC  TROPONIN I  TROPONIN I    EKG EKG Interpretation  Date/Time:  Friday January 02 2019 15:08:15 EST Ventricular Rate:  71 PR Interval:    QRS Duration: 103 QT Interval:  374 QTC Calculation: 407 R Axis:   -39 Text Interpretation:  Sinus rhythm Left axis deviation Borderline T abnormalities, anterior leads Interpretation limited secondary to artifact however the anterior ST/T changes  appear a little more prominent compared to Sept 2019 Confirmed by Pricilla Loveless [id number]) on 01/02/2019 3:26:55 PM   EKG Interpretation  Date/Time:  Friday January 02 2019 18:01:51 EST Ventricular Rate:  71 PR Interval:    QRS Duration: 100 QT Interval:  414 QTC Calculation: 450 R Axis:   -27 Text Interpretation:  Sinus rhythm  Borderline left axis deviation Low voltage, precordial leads Borderline T abnormalities, anterior leads no significant change since earlier in the day Confirmed by Pricilla LovelessGoldston, Seleste Tallman [phone]([id number]) on 01/02/2019 6:24:47 PM        Radiology Dg Chest 2 View  Result Date: 01/02/2019 CLINICAL DATA:  Chest pain for the past 3-4 days. EXAM: CHEST - 2 VIEW COMPARISON:  11/03/2018. FINDINGS: Normal sized heart. Mildly tortuous and calcified thoracic aorta. The lungs remain hyperexpanded with mild diffuse peribronchial thickening and accentuation of the interstitial markings. Thoracic spine degenerative changes. IMPRESSION: No acute abnormality. Stable changes of COPD and chronic bronchitis. Electronically Signed   By: Beckie SaltsSteven  Reid M.D.   On: 01/02/2019 15:20    Procedures Procedures (including critical care time)  Medications Ordered in ED Medications  sodium chloride flush (NS) 0.9 % injection 3 mL (3 mLs Intravenous Given 01/02/19 1547)  aspirin chewable tablet 324 mg (324 mg Oral Given 01/02/19 1542)  potassium chloride SA (K-DUR,KLOR-CON) CR tablet 40 mEq (40 mEq Oral Given 01/02/19 1607)     Initial Impression / Assessment and Plan / ED Course  I have reviewed the triage vital signs and the nursing notes.  Pertinent labs & imaging results that were available during my care of the patient were reviewed by me and considered in my medical decision making (see chart for details).     Given age and prior vascular event such as TIA, I recommended admission to the [facility] for observation and ACS work-up.  She declines.  She has not had any chest pain since prior to arrival.  We  discussed that while the labs are helpful to rule out MI, she has some nonspecific T wave changes and there could be underlying coronary disease.  She seems understand this but still wants to be discharged home.  I think this is reasonable and she understands what I am concerned about and possible consequences such as death or heart attack.  Potassium was repleted here.  She was encouraged to follow very closely with PCP and to return at any time.  Final Clinical Impressions(s) / ED Diagnoses   Final diagnoses:  Nonspecific chest pain    ED Discharge Orders    None       Pricilla LovelessGoldston, Margarie Mcguirt, MD 01/02/19 Windell [id number]

## 2019-01-02 NOTE — ED Notes (Signed)
Pt ambulatory to bathroom without difficulty. Denies pain.

## 2019-01-31 ENCOUNTER — Encounter (HOSPITAL_BASED_OUTPATIENT_CLINIC_OR_DEPARTMENT_OTHER): Payer: Self-pay | Admitting: Emergency Medicine

## 2019-01-31 ENCOUNTER — Other Ambulatory Visit: Payer: Self-pay

## 2019-01-31 ENCOUNTER — Emergency Department (HOSPITAL_BASED_OUTPATIENT_CLINIC_OR_DEPARTMENT_OTHER)
Admission: EM | Admit: 2019-01-31 | Discharge: 2019-01-31 | Disposition: A | Discharge status: Home / Self Care | Payer: Medicare HMO | Attending: Emergency Medicine | Admitting: Emergency Medicine

## 2019-01-31 DIAGNOSIS — M7122 Synovial cyst of popliteal space [Baker], left knee: Secondary | ICD-10-CM | POA: Diagnosis not present

## 2019-01-31 DIAGNOSIS — M7121 Synovial cyst of popliteal space [Baker], right knee: Secondary | ICD-10-CM | POA: Insufficient documentation

## 2019-01-31 DIAGNOSIS — F419 Anxiety disorder, unspecified: Secondary | ICD-10-CM | POA: Insufficient documentation

## 2019-01-31 DIAGNOSIS — Z79899 Other long term (current) drug therapy: Secondary | ICD-10-CM | POA: Insufficient documentation

## 2019-01-31 DIAGNOSIS — Z7982 Long term (current) use of aspirin: Secondary | ICD-10-CM | POA: Diagnosis not present

## 2019-01-31 DIAGNOSIS — Z8673 Personal history of transient ischemic attack (TIA), and cerebral infarction without residual deficits: Secondary | ICD-10-CM | POA: Diagnosis not present

## 2019-01-31 DIAGNOSIS — R2243 Localized swelling, mass and lump, lower limb, bilateral: Secondary | ICD-10-CM | POA: Diagnosis present

## 2019-01-31 MED ORDER — TRAMADOL HCL 50 MG PO TABS: 50.0000 mg | ORAL_TABLET | Freq: Four times a day (QID) | ORAL | 0 refills | Status: DC | PRN

## 2019-01-31 MED ORDER — ACETAMINOPHEN 325 MG PO TABS
650.0000 mg | ORAL_TABLET | ORAL | Status: DC | PRN
  Filled 2019-01-31: qty 2

## 2019-01-31 NOTE — ED Notes (Signed)
Patient verbalizes understanding of discharge instructions. Opportunity for questioning and answers were provided. Armband removed by staff, pt discharged from ED.  

## 2019-01-31 NOTE — ED Triage Notes (Signed)
Patient reports that she has boils to back of both her legs

## 2019-01-31 NOTE — ED Provider Notes (Signed)
[facility] EMERGENCY DEPARTMENT Provider Note   CSN: [id number] Arrival date & time: 01/31/19  1309    History   Chief Complaint Chief Complaint  Patient presents with  . Abscess    HPI Shannon Wise is a 85 y.o. female.     HPI Patient has history of recurrent Baker's cyst.  Over the last few days she is developed swelling in the popliteal fossa of both lower extremities, left greater than right.  She states she is having some discomfort due to the swelling.  There is no redness or warmth.  She denies any known injury.  No fever or chills. Past Medical History:  Diagnosis Date  . Anxiety   . HOH (hard of hearing)   . Migraine   . Stroke [facility])     Patient Active Problem List   Diagnosis Date Noted  . History of kidney stones 07/11/2018  . Sleep apnea 11/03/2016  . Anxiety 11/03/2016  . TMJ (temporomandibular joint disorder) 10/11/2016  . PVC (premature ventricular contraction) 03/16/2016  . Palpitations 03/16/2016  . Sensorineural hearing loss (SNHL), bilateral 01/04/2016  . Facet arthropathy 12/29/2014  . DDD (degenerative disc disease), lumbar 12/29/2014  . Cervical spine fracture (HCC) 03/25/2014    Past Surgical History:  Procedure Laterality Date  . ABDOMINAL HYSTERECTOMY    . BLADDER SURGERY       OB History   No obstetric history on file.      Home Medications    Prior to Admission medications   Medication Sig Start Date End Date Taking? Authorizing Provider  ALPRAZolam (XANAX PO) Take by mouth.    [provider]  aspirin EC 81 MG tablet Take by mouth. 02/14/17   [provider]  butalbital-acetaminophen-caffeine (FIORICET, ESGIC) 50-325-40 MG tablet TK 1 T Q 4-6 H PRF HEADACHE 06/30/18   [provider]  diclofenac sodium (VOLTAREN) 1 % GEL Apply 2 g topically 4 (four) times daily. Apply to the area on your knees that hurt 12/10/18   Gwyneth Sprout, MD  estradiol (ESTRACE VAGINAL) 0.1 MG/GM vaginal cream  Place 1 Applicatorful vaginally at bedtime. 09/02/18   Azalia Bilis, MD  fluticasone (FLONASE) 50 MCG/ACT nasal spray 1 spray by Each Nare route daily for 10 days. 11/07/17   [provider]  HYDROcodone-acetaminophen (NORCO/VICODIN) 5-325 MG tablet Take 1 tablet by mouth every 6 (six) hours as needed. 07/05/18   Raeford Razor, MD  ibuprofen (ADVIL,MOTRIN) 400 MG tablet Take 1 tablet (400 mg total) by mouth every 6 (six) hours as needed. 11/16/18   Petrucelli, Samantha R, PA-C  lidocaine (LIDODERM) 5 % Place 1 patch onto the skin daily. Remove & Discard patch within 12 hours or as directed by MD 12/10/18   Gwyneth Sprout, MD  loratadine (CLARITIN) 10 MG tablet Take by mouth. 11/07/17   [provider]  metoprolol succinate (TOPROL XL) 25 MG 24 hr tablet Take by mouth. 11/04/16   [provider]  naproxen (NAPROSYN) 500 MG tablet Take by mouth. 05/27/18   [provider]  predniSONE (DELTASONE) 20 MG tablet Take 2 tablets (40 mg total) by mouth daily. 12/10/18   Gwyneth Sprout, MD  traMADol (ULTRAM) 50 MG tablet Take 1 tablet (50 mg total) by mouth every 6 (six) hours as needed for severe pain. 01/31/19   Loren Racer, MD    Family History History reviewed. No pertinent family history.  Social History Social History   Tobacco Use  . Smoking status: Never Smoker  .  Smokeless tobacco: Never Used  Substance Use Topics  . Alcohol use: No    Frequency: Never  . Drug use: No     Allergies   Codeine and Red dye   Review of Systems Review of Systems  Constitutional: Negative for chills and fever.  Musculoskeletal: Negative for arthralgias and myalgias.  Skin: Negative for rash.  Neurological: Negative for dizziness, weakness, light-headedness and numbness.     Physical Exam Updated Vital Signs BP (!) 153/96 (BP Location: Left Arm)   Pulse 92   Temp 97.7 F (36.5 C) (Oral)   Resp 20   Ht 5\' 5"  (1.651 m)   Wt 61.2 kg   SpO2 98%   BMI  22.45 kg/m   Physical Exam Vitals signs and nursing note reviewed.  Constitutional:      General: She is not in acute distress.    Appearance: Normal appearance. She is well-developed. She is not ill-appearing.  HENT:     Head: Normocephalic and atraumatic.  Eyes:     Pupils: Pupils are equal, round, and reactive to light.  Neck:     Musculoskeletal: Normal range of motion and neck supple.  Cardiovascular:     Rate and Rhythm: Normal rate and regular rhythm.  Pulmonary:     Effort: Pulmonary effort is normal.     Breath sounds: Normal breath sounds.  Abdominal:     Palpations: Abdomen is soft.  Musculoskeletal: Normal range of motion.        General: Swelling and tenderness present.     Comments: Patient with a right left greater than right popliteal fossa fullness.  No erythema, warmth or fluctuance.  Mild tenderness to palpation.  No calf swelling or tenderness.  Distal pulses intact.  Full range of motion of bilateral knees without obvious effusion.  No ligamentous laxity.  Skin:    General: Skin is warm and dry.     Capillary Refill: Capillary refill takes less than 2 seconds.     Findings: No erythema or rash.  Neurological:     General: No focal deficit present.     Mental Status: She is alert and oriented to person, place, and time.     Comments: 5/5 motor all extremities.  Sensation intact.  Ambulates without difficulty.  Patient is hard of hearing.  Psychiatric:        Mood and Affect: Mood normal.        Behavior: Behavior normal.      ED Treatments / Results  Labs (all labs ordered are listed, but only abnormal results are displayed) Labs Reviewed - No data to display  EKG None  Radiology No results found.  Procedures Procedures (including critical care time)  Medications Ordered in ED Medications  acetaminophen (TYLENOL) tablet 650 mg (has no administration in time range)     Initial Impression / Assessment and Plan / ED Course  I have reviewed  the triage vital signs and the nursing notes.  Pertinent labs & imaging results that were available during my care of the patient were reviewed by me and considered in my medical decision making (see chart for details).        Low suspicion for DVT.  Physical exam is consistent with prior history of Baker's cyst. Encouraged conservative measures.  If symptoms persist advised to follow-up with sports medicine.    Final Clinical Impressions(s) / ED Diagnoses   Final diagnoses:  Baker's cyst, left  Baker cyst, right    ED Discharge Orders  Ordered    traMADol (ULTRAM) 50 MG tablet  Every 6 hours PRN     01/31/19 1418           Loren Racer, MD 01/31/19 1419

## 2019-02-16 ENCOUNTER — Ambulatory Visit: Payer: Medicare HMO | Admitting: Family Medicine

## 2019-05-03 ENCOUNTER — Other Ambulatory Visit: Payer: Self-pay

## 2019-05-03 ENCOUNTER — Emergency Department (HOSPITAL_BASED_OUTPATIENT_CLINIC_OR_DEPARTMENT_OTHER)
Admission: EM | Admit: 2019-05-03 | Discharge: 2019-05-03 | Disposition: A | Discharge status: Home / Self Care | Payer: Medicare HMO | Attending: Emergency Medicine | Admitting: Emergency Medicine

## 2019-05-03 ENCOUNTER — Encounter (HOSPITAL_BASED_OUTPATIENT_CLINIC_OR_DEPARTMENT_OTHER): Payer: Self-pay | Admitting: Emergency Medicine

## 2019-05-03 ENCOUNTER — Emergency Department (HOSPITAL_BASED_OUTPATIENT_CLINIC_OR_DEPARTMENT_OTHER): Payer: Medicare HMO

## 2019-05-03 DIAGNOSIS — Z8673 Personal history of transient ischemic attack (TIA), and cerebral infarction without residual deficits: Secondary | ICD-10-CM | POA: Insufficient documentation

## 2019-05-03 DIAGNOSIS — R2243 Localized swelling, mass and lump, lower limb, bilateral: Secondary | ICD-10-CM | POA: Diagnosis not present

## 2019-05-03 DIAGNOSIS — M79606 Pain in leg, unspecified: Secondary | ICD-10-CM | POA: Diagnosis present

## 2019-05-03 DIAGNOSIS — Z79899 Other long term (current) drug therapy: Secondary | ICD-10-CM | POA: Insufficient documentation

## 2019-05-03 DIAGNOSIS — Z7982 Long term (current) use of aspirin: Secondary | ICD-10-CM | POA: Insufficient documentation

## 2019-05-03 DIAGNOSIS — M25561 Pain in right knee: Secondary | ICD-10-CM

## 2019-05-03 MED ORDER — TRAMADOL HCL 50 MG PO TABS: 50.0000 mg | ORAL_TABLET | Freq: Four times a day (QID) | ORAL | 0 refills | Status: DC | PRN

## 2019-05-03 MED ORDER — DICLOFENAC SODIUM 1 % TD GEL: 2.0000 g | Freq: Four times a day (QID) | TRANSDERMAL | 1 refills | Status: DC

## 2019-05-03 MED ORDER — TRAMADOL HCL 50 MG PO TABS
50.0000 mg | ORAL_TABLET | Freq: Once | ORAL | Status: AC
  Administered 2019-05-03: 15:00:00 50 mg via ORAL
  Filled 2019-05-03: qty 1

## 2019-05-03 NOTE — ED Triage Notes (Signed)
Bilateral pain to both legs from the knees down x 3 weeks. Denies injury.

## 2019-05-03 NOTE — ED Notes (Signed)
Patient transported to Ultrasound 

## 2019-05-03 NOTE — ED Provider Notes (Signed)
[facility] EMERGENCY DEPARTMENT Provider Note   CSN: [id number] Arrival date & time: 05/03/19  1410    History   Chief Complaint Chief Complaint  Patient presents with   Leg Pain    HPI Shannon Wise is a 85 y.o. female.     Patient is a 85 year old female who presents with bilateral knee pain.  She states that her knees ache from time to time but over the last 3 weeks they have been markedly worse.  She notices some intermittent swelling to her knees.  She also has some pain behind her left knee which she attributes to a known Baker's cyst.  She has had some swelling of her left leg as compared to the right which she says she does not typically have.  She denies any specific calf tenderness.  She denies any injuries to the leg.  She says that she has not really been up walking more than she normally does.  She has been using Tylenol without improvement in symptoms.     Past Medical History:  Diagnosis Date   Anxiety    HOH (hard of hearing)    Migraine    Stroke [facility])     Patient Active Problem List   Diagnosis Date Noted   History of kidney stones 07/11/2018   Sleep apnea 11/03/2016   Anxiety 11/03/2016   TMJ (temporomandibular joint disorder) 10/11/2016   PVC (premature ventricular contraction) 03/16/2016   Palpitations 03/16/2016   Sensorineural hearing loss (SNHL), bilateral 01/04/2016   Facet arthropathy 12/29/2014   DDD (degenerative disc disease), lumbar 12/29/2014   Cervical spine fracture (HCC) 03/25/2014    Past Surgical History:  Procedure Laterality Date   ABDOMINAL HYSTERECTOMY     BLADDER SURGERY       OB History   No obstetric history on file.      Home Medications    Prior to Admission medications   Medication Sig Start Date End Date Taking? Authorizing Provider  ALPRAZolam (XANAX PO) Take by mouth.    [provider]  aspirin EC 81 MG tablet Take by mouth. 02/14/17   [provider]    butalbital-acetaminophen-caffeine (FIORICET, ESGIC) 50-325-40 MG tablet TK 1 T Q 4-6 H PRF HEADACHE 06/30/18   [provider]  diclofenac sodium (VOLTAREN) 1 % GEL Apply 2 g topically 4 (four) times daily. Apply to the area on your knees that hurt 05/03/19   Rolan Bucco, MD  estradiol (ESTRACE VAGINAL) 0.1 MG/GM vaginal cream Place 1 Applicatorful vaginally at bedtime. 09/02/18   Azalia Bilis, MD  fluticasone (FLONASE) 50 MCG/ACT nasal spray 1 spray by Each Nare route daily for 10 days. 11/07/17   [provider]  HYDROcodone-acetaminophen (NORCO/VICODIN) 5-325 MG tablet Take 1 tablet by mouth every 6 (six) hours as needed. 07/05/18   Raeford Razor, MD  ibuprofen (ADVIL,MOTRIN) 400 MG tablet Take 1 tablet (400 mg total) by mouth every 6 (six) hours as needed. 11/16/18   Petrucelli, Samantha R, PA-C  lidocaine (LIDODERM) 5 % Place 1 patch onto the skin daily. Remove & Discard patch within 12 hours or as directed by MD 12/10/18   Gwyneth Sprout, MD  loratadine (CLARITIN) 10 MG tablet Take by mouth. 11/07/17   [provider]  metoprolol succinate (TOPROL XL) 25 MG 24 hr tablet Take by mouth. 11/04/16   [provider]  naproxen (NAPROSYN) 500 MG tablet Take by mouth. 05/27/18   [provider]  predniSONE (DELTASONE) 20 MG tablet Take  2 tablets (40 mg total) by mouth daily. 12/10/18   Gwyneth Sprout, MD  traMADol (ULTRAM) 50 MG tablet Take 1 tablet (50 mg total) by mouth every 6 (six) hours as needed for severe pain. 05/03/19   Rolan Bucco, MD    Family History No family history on file.  Social History Social History   Tobacco Use   Smoking status: Never Smoker   Smokeless tobacco: Never Used  Substance Use Topics   Alcohol use: No    Frequency: Never   Drug use: No     Allergies   Codeine and Red dye   Review of Systems Review of Systems  Constitutional: Negative for chills, diaphoresis, fatigue and fever.  HENT: Negative for  congestion, rhinorrhea and sneezing.   Eyes: Negative.   Respiratory: Negative for cough, chest tightness and shortness of breath.   Cardiovascular: Positive for leg swelling. Negative for chest pain.  Gastrointestinal: Negative for abdominal pain, blood in stool, diarrhea, nausea and vomiting.  Genitourinary: Negative for difficulty urinating, flank pain, frequency and hematuria.  Musculoskeletal: Positive for arthralgias. Negative for back pain.  Skin: Negative for rash.  Neurological: Negative for dizziness, speech difficulty, weakness, numbness and headaches.     Physical Exam Updated Vital Signs BP 125/79 (BP Location: Right Arm)    Pulse 86    Temp 98.3 F (36.8 C) (Oral)    Resp 18    Ht  (1.626 m)    Wt 59.9 kg    SpO2 98%    BMI 22.66 kg/m   Physical Exam Constitutional:      Appearance: She is well-developed.  HENT:     Head: Normocephalic and atraumatic.  Eyes:     Pupils: Pupils are equal, round, and reactive to light.  Neck:     Musculoskeletal: Normal range of motion and neck supple.  Cardiovascular:     Rate and Rhythm: Normal rate and regular rhythm.     Heart sounds: Normal heart sounds.  Pulmonary:     Effort: Pulmonary effort is normal. No respiratory distress.     Breath sounds: Normal breath sounds. No wheezing or rales.  Chest:     Chest wall: No tenderness.  Abdominal:     General: Bowel sounds are normal.     Palpations: Abdomen is soft.     Tenderness: There is no abdominal tenderness. There is no guarding or rebound.  Musculoskeletal: Normal range of motion.     Comments: Patient has tenderness on range of motion to the knees bilaterally.  There is no effusions or significant swelling.  There is some mild swelling to the lower legs bilaterally but more so on the left leg.  Pedal pulses are intact.  She has normal sensation and motor function distally.  No wounds are noted.  Lymphadenopathy:     Cervical: No cervical adenopathy.  Skin:     General: Skin is warm and dry.     Findings: No rash.  Neurological:     Mental Status: She is alert and oriented to person, place, and time.      ED Treatments / Results  Labs (all labs ordered are listed, but only abnormal results are displayed) Labs Reviewed - No data to display  EKG None  Radiology US Venous Img Lower Bilateral  Result Date: 05/03/2019 CLINICAL DATA:  Lower extremity pain and edema bilaterally EXAM: BILATERAL LOWER EXTREMITY VENOUS DUPLEX ULTRASOUND TECHNIQUE: Gray-scale sonography with graded compression, as well as color Doppler and duplex ultrasound  were performed to evaluate the lower extremity deep venous systems from the level of the common femoral vein and including the common femoral, femoral, profunda femoral, popliteal and calf veins including the posterior tibial, peroneal and gastrocnemius veins when visible. The superficial great saphenous vein was also interrogated. Spectral Doppler was utilized to evaluate flow at rest and with distal augmentation maneuvers in the common femoral, femoral and popliteal veins. COMPARISON:  None. FINDINGS: RIGHT LOWER EXTREMITY Common Femoral Vein: No evidence of thrombus. Normal compressibility, respiratory phasicity and response to augmentation. Saphenofemoral Junction: No evidence of thrombus. Normal compressibility and flow on color Doppler imaging. Profunda Femoral Vein: No evidence of thrombus. Normal compressibility and flow on color Doppler imaging. Femoral Vein: No evidence of thrombus. Normal compressibility, respiratory phasicity and response to augmentation. Popliteal Vein: No evidence of thrombus. Normal compressibility, respiratory phasicity and response to augmentation. Calf Veins: No evidence of thrombus. Normal compressibility and flow on color Doppler imaging. Superficial Great Saphenous Vein: No evidence of thrombus. Normal compressibility. Venous Reflux:  None. Other Findings:  None. LEFT LOWER EXTREMITY Common  Femoral Vein: No evidence of thrombus. Normal compressibility, respiratory phasicity and response to augmentation. Saphenofemoral Junction: No evidence of thrombus. Normal compressibility and flow on color Doppler imaging. Profunda Femoral Vein: No evidence of thrombus. Normal compressibility and flow on color Doppler imaging. Femoral Vein: No evidence of thrombus. Normal compressibility, respiratory phasicity and response to augmentation. Popliteal Vein: No evidence of thrombus. Normal compressibility, respiratory phasicity and response to augmentation. Calf Veins: No evidence of thrombus. Normal compressibility and flow on color Doppler imaging. Superficial Great Saphenous Vein: No evidence of thrombus. Normal compressibility. Venous Reflux:  None. Other Findings: There is a 4.4 x 1.3 x 3.2 cm mildly complex cystic appearing area in the medial left popliteal fossa region. IMPRESSION: No evidence of deep venous thrombosis in either lower extremity. Probable hemorrhagic or possibly infected Baker's cyst in the medial popliteal fossa region left measuring 4.4 x 1 3 x 3.2 cm. Electronically Signed   By: Bretta Bang III M.D.   On: 05/03/2019 16:41    Procedures Procedures (including critical care time)  Medications Ordered in ED Medications  traMADol (ULTRAM) tablet 50 mg (50 mg Oral Given 05/03/19 1523)     Initial Impression / Assessment and Plan / ED Course  I have reviewed the triage vital signs and the nursing notes.  Pertinent labs & imaging results that were available during my care of the patient were reviewed by me and considered in my medical decision making (see chart for details).        Patient presents with pain to both of her knees.  She is actually been seen in the emergency department previously for similar symptoms.  She does have some increased swelling in her left leg as compared to the right.  She has a known Baker's cyst behind the knee.  Ultrasound was performed and  shows no evidence of DVT.  There was a questionable hemorrhagic versus infected Baker's cyst although her cyst is nontender on exam.  There is no warmth or erythema.  She actually says it smaller than it was in the past.  I do not feel any further treatment is indicated at this point with antibiotic therapy.  She was discharged home in good condition.  There is no joint effusion or suggestions of infection.  I feel this is likely osteoarthritis.  She was given prescriptions for Voltaren gel and a short course of tramadol.  She was encouraged  to follow-up with her primary care provider or Dr. Pearletha ForgeHudnall if her symptoms are not improving.  Return precautions were given.  Final Clinical Impressions(s) / ED Diagnoses   Final diagnoses:  Acute pain of both knees    ED Discharge Orders         Ordered    traMADol (ULTRAM) 50 MG tablet  Every 6 hours PRN     05/03/19 1652    diclofenac sodium (VOLTAREN) 1 % GEL  4 times daily     05/03/19 1652           Rolan BuccoBelfi, Mairin Lindsley, MD 05/03/19 1654

## 2019-06-03 ENCOUNTER — Encounter (HOSPITAL_BASED_OUTPATIENT_CLINIC_OR_DEPARTMENT_OTHER): Payer: Self-pay

## 2019-06-03 ENCOUNTER — Other Ambulatory Visit: Payer: Self-pay

## 2019-06-03 ENCOUNTER — Emergency Department (HOSPITAL_BASED_OUTPATIENT_CLINIC_OR_DEPARTMENT_OTHER)
Admission: EM | Admit: 2019-06-03 | Discharge: 2019-06-03 | Disposition: A | Discharge status: Home / Self Care | Payer: Medicare HMO | Attending: Emergency Medicine | Admitting: Emergency Medicine

## 2019-06-03 DIAGNOSIS — Z79899 Other long term (current) drug therapy: Secondary | ICD-10-CM | POA: Diagnosis not present

## 2019-06-03 DIAGNOSIS — R300 Dysuria: Secondary | ICD-10-CM | POA: Diagnosis present

## 2019-06-03 DIAGNOSIS — Z8673 Personal history of transient ischemic attack (TIA), and cerebral infarction without residual deficits: Secondary | ICD-10-CM | POA: Insufficient documentation

## 2019-06-03 DIAGNOSIS — R42 Dizziness and giddiness: Secondary | ICD-10-CM

## 2019-06-03 DIAGNOSIS — N390 Urinary tract infection, site not specified: Secondary | ICD-10-CM | POA: Diagnosis not present

## 2019-06-03 LAB — URINALYSIS, ROUTINE W REFLEX MICROSCOPIC
Bilirubin Urine: NEGATIVE
Glucose, UA: NEGATIVE mg/dL
Ketones, ur: NEGATIVE mg/dL
Nitrite: NEGATIVE
Protein, ur: NEGATIVE mg/dL
Specific Gravity, Urine: 1.01 (ref 1.005–1.030)
pH: 7 (ref 5.0–8.0)

## 2019-06-03 LAB — URINALYSIS, MICROSCOPIC (REFLEX): WBC, UA: >50 WBC/hpf (ref 0–5)

## 2019-06-03 MED ORDER — MECLIZINE HCL 25 MG PO TABS: 25.0000 mg | ORAL_TABLET | Freq: Three times a day (TID) | ORAL | 0 refills | Status: DC | PRN

## 2019-06-03 MED ORDER — MECLIZINE HCL 25 MG PO TABS
25.0000 mg | ORAL_TABLET | Freq: Once | ORAL | Status: AC
  Administered 2019-06-03: 01:00:00 25 mg via ORAL
  Filled 2019-06-03: qty 1

## 2019-06-03 MED ORDER — PHENAZOPYRIDINE HCL 200 MG PO TABS: 200.0000 mg | ORAL_TABLET | Freq: Three times a day (TID) | ORAL | 0 refills | Status: DC | PRN

## 2019-06-03 MED ORDER — CEPHALEXIN 250 MG PO CAPS
500.0000 mg | ORAL_CAPSULE | Freq: Once | ORAL | Status: AC
  Administered 2019-06-03: 01:00:00 500 mg via ORAL
  Filled 2019-06-03: qty 2

## 2019-06-03 MED ORDER — CEPHALEXIN 500 MG PO CAPS: 500.0000 mg | ORAL_CAPSULE | Freq: Two times a day (BID) | ORAL | 0 refills | Status: DC

## 2019-06-03 MED ORDER — PHENAZOPYRIDINE HCL 100 MG PO TABS
200.0000 mg | ORAL_TABLET | Freq: Once | ORAL | Status: AC
  Administered 2019-06-03: 01:00:00 200 mg via ORAL
  Filled 2019-06-03: qty 2

## 2019-06-03 NOTE — ED Provider Notes (Signed)
[place] DEPT [facility] Provider Note: Shannon Spurling, MD, FACEP  CSN: [id number] MRN: [id number] ARRIVAL: 06/03/19 at [place]: [place]  06/03/19 12:15 AM Shannon Wise is a 85 y.o. female complains of severe burning with urination since about 3 PM yesterday afternoon.  She has also had some low back pain.  Symptoms are consistent with previous urinary tract infections.  She denies fever or chills.  She has felt somewhat off balance yesterday when ambulating.  She had a urinary tract infection in November of last year, culture grew out pansensitive E. Coli.    Past Medical History:  Diagnosis Date  . Anxiety   . HOH (hard of hearing)   . Migraine   . Stroke [facility])     Past Surgical History:  Procedure Laterality Date  . ABDOMINAL HYSTERECTOMY    . BLADDER SURGERY      No family history on file.  Social History   Tobacco Use  . Smoking status: Never Smoker  . Smokeless tobacco: Never Used  Substance Use Topics  . Alcohol use: No    Frequency: Never  . Drug use: No    Prior to Admission medications   Medication Sig Start Date End Date Taking? Authorizing Provider  ALPRAZolam (XANAX PO) Take by mouth.    [provider]  aspirin EC 81 MG tablet Take by mouth. 02/14/17   [provider]  butalbital-acetaminophen-caffeine (FIORICET, ESGIC) 50-325-40 MG tablet TK 1 T Q 4-6 H PRF HEADACHE 06/30/18   [provider]  diclofenac sodium (VOLTAREN) 1 % GEL Apply 2 g topically 4 (four) times daily. Apply to the area on your knees that hurt 05/03/19   Malvin Johns, MD  estradiol (ESTRACE VAGINAL) 0.1 MG/GM vaginal cream Place 1 Applicatorful vaginally at bedtime. 09/02/18   Jola Schmidt, MD  fluticasone (FLONASE) 50 MCG/ACT nasal spray 1 spray by Each Nare route daily for 10 days. 11/07/17   [provider]  HYDROcodone-acetaminophen (NORCO/VICODIN) 5-325 MG tablet Take 1 tablet by  mouth every 6 (six) hours as needed. 07/05/18   Virgel Manifold, MD  ibuprofen (ADVIL,MOTRIN) 400 MG tablet Take 1 tablet (400 mg total) by mouth every 6 (six) hours as needed. 11/16/18   Petrucelli, Samantha R, PA-C  lidocaine (LIDODERM) 5 % Place 1 patch onto the skin daily. Remove & Discard patch within 12 hours or as directed by MD 12/10/18   Blanchie Dessert, MD  loratadine (CLARITIN) 10 MG tablet Take by mouth. 11/07/17   [provider]  metoprolol succinate (TOPROL XL) 25 MG 24 hr tablet Take by mouth. 11/04/16   [provider]  naproxen (NAPROSYN) 500 MG tablet Take by mouth. 05/27/18   [provider]  predniSONE (DELTASONE) 20 MG tablet Take 2 tablets (40 mg total) by mouth daily. 12/10/18   Blanchie Dessert, MD  traMADol (ULTRAM) 50 MG tablet Take 1 tablet (50 mg total) by mouth every 6 (six) hours as needed for severe pain. 05/03/19   Malvin Johns, MD    Allergies Codeine, Red dye, and Nitrofurantoin   REVIEW OF SYSTEMS  Negative except as noted here or in the History of Present Illness.   PHYSICAL EXAMINATION  Initial Vital Signs Blood pressure (!) 150/86, pulse 76, temperature 98 F (36.7 C), temperature source Oral, resp. rate 18, weight 54.4 kg, SpO2 99 %.  Examination General: Well-developed, well-nourished female in no acute distress; appearance consistent with age of  record HENT: normocephalic; atraumatic Eyes: Normal appearance; no nystagmus Neck: supple Heart: regular rate and rhythm Lungs: clear to auscultation bilaterally Abdomen: soft; nondistended; nontender; bowel sounds present Extremities: No deformity; full range of motion; pulses normal Neurologic: Awake, alert and oriented; mildly hard of hearing; motor function intact in all extremities and symmetric; no facial droop; normal coordination, speech and gait; negative Romberg; normal finger-to-nose Skin: Warm and dry Psychiatric: Normal mood and affect   RESULTS  Summary of  this visit's results, reviewed by myself:   EKG Interpretation  Date/Time:    Ventricular Rate:    PR Interval:    QRS Duration:   QT Interval:    QTC Calculation:   R Axis:     Text Interpretation:        Laboratory Studies: Results for orders placed or performed during the hospital encounter of 06/03/19 (from the past 24 hour(s))  Urinalysis, Routine w reflex microscopic     Status: Abnormal   Collection Time: 06/03/19 12:15 AM  Result Value Ref Range   Color, Urine STRAW (A) YELLOW   APPearance CLOUDY (A) CLEAR   Specific Gravity, Urine 1.010 1.005 - 1.030   pH 7.0 5.0 - 8.0   Glucose, UA NEGATIVE NEGATIVE mg/dL   Hgb urine dipstick SMALL (A) NEGATIVE   Bilirubin Urine NEGATIVE NEGATIVE   Ketones, ur NEGATIVE NEGATIVE mg/dL   Protein, ur NEGATIVE NEGATIVE mg/dL   Nitrite NEGATIVE NEGATIVE   Leukocytes,Ua MODERATE (A) NEGATIVE  Urinalysis, Microscopic (reflex)     Status: Abnormal   Collection Time: 06/03/19 12:15 AM  Result Value Ref Range   RBC / HPF 0-5 0 - 5 RBC/hpf   WBC, UA >50 0 - 5 WBC/hpf   Bacteria, UA MANY (A) NONE SEEN   Squamous Epithelial / LPF 0-5 0 - 5   WBC Clumps PRESENT    Imaging Studies: No results found.  ED COURSE and MDM  Nursing notes and initial vitals signs, including pulse oximetry, reviewed.  Vitals:   06/03/19 0010 06/03/19 0011  BP: (!) 150/86   Pulse: 76   Resp: 18   Temp: 98 F (36.7 C)   TempSrc: Oral   SpO2: 99%   Weight:  54.4 kg   Will treat for urinary tract infection.  Dizziness may be related to urinary tract infection.  Neurologic examination is normal (except for hearing deficit).  Will treat with meclizine symptomatically.  PROCEDURES    ED DIAGNOSES     ICD-10-CM   1. Lower urinary tract infectious disease  N39.0   2. Dizziness  R42        Shannon Wise, Shannon RuizJohn, MD 06/03/19 [phone]

## 2019-06-03 NOTE — ED Triage Notes (Signed)
As pt was ambulating to the restroom pt states that she has had some dizziness that started today.

## 2019-06-03 NOTE — ED Triage Notes (Signed)
Pt reports suprapubic "burning" with urination. Pt also states she has periodic back pain. Symptoms x 1 day.

## 2019-06-04 LAB — URINE CULTURE: Culture: NO GROWTH

## 2019-06-12 ENCOUNTER — Emergency Department (HOSPITAL_BASED_OUTPATIENT_CLINIC_OR_DEPARTMENT_OTHER)
Admission: EM | Admit: 2019-06-12 | Discharge: 2019-06-12 | Disposition: A | Discharge status: Home / Self Care | Payer: Medicare HMO | Attending: Emergency Medicine | Admitting: Emergency Medicine

## 2019-06-12 ENCOUNTER — Encounter (HOSPITAL_BASED_OUTPATIENT_CLINIC_OR_DEPARTMENT_OTHER): Payer: Self-pay | Admitting: Emergency Medicine

## 2019-06-12 ENCOUNTER — Other Ambulatory Visit: Payer: Self-pay

## 2019-06-12 DIAGNOSIS — N3000 Acute cystitis without hematuria: Secondary | ICD-10-CM

## 2019-06-12 DIAGNOSIS — Z79899 Other long term (current) drug therapy: Secondary | ICD-10-CM | POA: Insufficient documentation

## 2019-06-12 DIAGNOSIS — N3091 Cystitis, unspecified with hematuria: Secondary | ICD-10-CM | POA: Diagnosis not present

## 2019-06-12 DIAGNOSIS — R300 Dysuria: Secondary | ICD-10-CM | POA: Diagnosis present

## 2019-06-12 LAB — CBC WITH DIFFERENTIAL/PLATELET
Abs Immature Granulocytes: 0.02 10*3/uL (ref 0.00–0.07)
Basophils Absolute: 0.1 10*3/uL (ref 0.0–0.1)
Basophils Relative: 1 %
Eosinophils Absolute: 0.2 10*3/uL (ref 0.0–0.5)
Eosinophils Relative: 2 %
HCT: 45.4 % (ref 36.0–46.0)
Hemoglobin: 14.2 g/dL (ref 12.0–15.0)
Immature Granulocytes: 0 %
Lymphocytes Relative: 26 %
Lymphs Abs: 2.4 10*3/uL (ref 0.7–4.0)
MCH: 29 pg (ref 26.0–34.0)
MCHC: 31.3 g/dL (ref 30.0–36.0)
MCV: 92.8 fL (ref 80.0–100.0)
Monocytes Absolute: 0.6 10*3/uL (ref 0.1–1.0)
Monocytes Relative: 6 %
Neutro Abs: 5.8 10*3/uL (ref 1.7–7.7)
Neutrophils Relative %: 65 %
Platelets: 336 10*3/uL (ref 150–400)
RBC: 4.89 MIL/uL (ref 3.87–5.11)
RDW: 13.1 % (ref 11.5–15.5)
WBC: 9 10*3/uL (ref 4.0–10.5)
nRBC: 0 % (ref 0.0–0.2)

## 2019-06-12 LAB — URINALYSIS, ROUTINE W REFLEX MICROSCOPIC
Bilirubin Urine: NEGATIVE
Glucose, UA: NEGATIVE mg/dL
Ketones, ur: NEGATIVE mg/dL
Nitrite: NEGATIVE
Protein, ur: NEGATIVE mg/dL
Specific Gravity, Urine: 1.015 (ref 1.005–1.030)
pH: 6.5 (ref 5.0–8.0)

## 2019-06-12 LAB — URINALYSIS, MICROSCOPIC (REFLEX): WBC, UA: >50 WBC/hpf (ref 0–5)

## 2019-06-12 LAB — BASIC METABOLIC PANEL
Anion gap: 9 (ref 5–15)
BUN: 7 mg/dL — ABNORMAL LOW (ref 8–23)
CO2: 26 mmol/L (ref 22–32)
Calcium: 9.2 mg/dL (ref 8.9–10.3)
Chloride: 104 mmol/L (ref 98–111)
Creatinine, Ser: 0.67 mg/dL (ref 0.44–1.00)
GFR calc Af Amer: >60 mL/min (ref >60)
GFR calc non Af Amer: >60 mL/min (ref >60)
Glucose, Bld: 96 mg/dL (ref 70–99)
Potassium: 3.6 mmol/L (ref 3.5–5.1)
Sodium: 139 mmol/L (ref 135–145)

## 2019-06-12 MED ORDER — LEVOFLOXACIN 750 MG PO TABS: 750.0000 mg | ORAL_TABLET | Freq: Every day | ORAL | 0 refills | Status: DC

## 2019-06-12 MED ORDER — LEVOFLOXACIN 750 MG PO TABS
750.0000 mg | ORAL_TABLET | Freq: Once | ORAL | Status: AC
  Administered 2019-06-12: 02:00:00 750 mg via ORAL
  Filled 2019-06-12: qty 1

## 2019-06-12 NOTE — Discharge Instructions (Addendum)
Begin taking Levaquin as prescribed this evening.  Return to the emergency department for severe pain, high fevers, or other new and concerning symptoms.

## 2019-06-12 NOTE — ED Provider Notes (Signed)
[facility] EMERGENCY DEPARTMENT Provider Note   CSN: [id number] Arrival date & time: 06/12/19  0005     History   Chief Complaint Chief Complaint  Patient presents with  . Dysuria    HPI Shannon Wise is a 85 y.o. female.     Patient is an 85 year old female with past medical history of prior CVA, migraines, and hard of hearing.  She presents today for evaluation of urinary burning.  She states that she has had burning with urination for the past week.  She was seen here approximately a week ago and treated with Keflex, however this has not helped.  She denies any fevers or chills.  She does report some suprapubic abdominal discomfort.  The history is provided by the patient.  Dysuria Pain quality:  Burning Pain severity:  Moderate Onset quality:  Gradual Duration:  1 week Timing:  Constant Progression:  Worsening Chronicity:  New Relieved by:  Nothing Worsened by:  Nothing Ineffective treatments:  Antibiotics (Keflex) Associated symptoms: abdominal pain   Associated symptoms: no fever and no flank pain     Past Medical History:  Diagnosis Date  . Anxiety   . HOH (hard of hearing)   . Migraine   . Stroke [facility](HCC)     Patient Active Problem List   Diagnosis Date Noted  . History of kidney stones 07/11/2018  . Sleep apnea 11/03/2016  . Anxiety 11/03/2016  . TMJ (temporomandibular joint disorder) 10/11/2016  . PVC (premature ventricular contraction) 03/16/2016  . Palpitations 03/16/2016  . Sensorineural hearing loss (SNHL), bilateral 01/04/2016  . Facet arthropathy 12/29/2014  . DDD (degenerative disc disease), lumbar 12/29/2014  . Cervical spine fracture (HCC) 03/25/2014    Past Surgical History:  Procedure Laterality Date  . ABDOMINAL HYSTERECTOMY    . BLADDER SURGERY       OB History   No obstetric history on file.      Home Medications    Prior to Admission medications   Medication Sig Start Date End Date Taking? Authorizing  Provider  ALPRAZolam (XANAX PO) Take by mouth.    [provider]  butalbital-acetaminophen-caffeine (FIORICET, ESGIC) 50-325-40 MG tablet TK 1 T Q 4-6 H PRF HEADACHE 06/30/18   [provider]  cephALEXin (KEFLEX) 500 MG capsule Take 1 capsule (500 mg total) by mouth 2 (two) times daily. 06/03/19   Molpus, John, MD  diclofenac sodium (VOLTAREN) 1 % GEL Apply 2 g topically 4 (four) times daily. Apply to the area on your knees that hurt 05/03/19   Rolan BuccoBelfi, Melanie, MD  estradiol (ESTRACE VAGINAL) 0.1 MG/GM vaginal cream Place 1 Applicatorful vaginally at bedtime. 09/02/18   Azalia Bilisampos, Kevin, MD  lidocaine (LIDODERM) 5 % Place 1 patch onto the skin daily. Remove & Discard patch within 12 hours or as directed by MD 12/10/18   Gwyneth SproutPlunkett, Whitney, MD  loratadine (CLARITIN) 10 MG tablet Take by mouth. 11/07/17   [provider]  meclizine (ANTIVERT) 25 MG tablet Take 1 tablet (25 mg total) by mouth 3 (three) times daily as needed for dizziness. 06/03/19   Molpus, John, MD  metoprolol succinate (TOPROL XL) 25 MG 24 hr tablet Take by mouth. 11/04/16   [provider]  phenazopyridine (PYRIDIUM) 200 MG tablet Take 1 tablet (200 mg total) by mouth 3 (three) times daily as needed for pain. 06/03/19   Molpus, John, MD  traMADol (ULTRAM) 50 MG tablet Take 1 tablet (50 mg total) by mouth every 6 (six) hours as needed  for severe pain. 05/03/19   Malvin Johns, MD  fluticasone (FLONASE) 50 MCG/ACT nasal spray 1 spray by Each Nare route daily for 10 days. 11/07/17 06/03/19  [provider]    Family History History reviewed. No pertinent family history.  Social History Social History   Tobacco Use  . Smoking status: Never Smoker  . Smokeless tobacco: Never Used  Substance Use Topics  . Alcohol use: No    Frequency: Never  . Drug use: No     Allergies   Codeine, Red dye, and Nitrofurantoin   Review of Systems Review of Systems  Constitutional: Negative for fever.   Gastrointestinal: Positive for abdominal pain.  Genitourinary: Positive for dysuria. Negative for flank pain.  All other systems reviewed and are negative.    Physical Exam Updated Vital Signs BP (!) 149/84 (BP Location: Right Arm)   Pulse 73   Temp 97.6 F (36.4 C) (Oral)   Resp 18   Ht 5\' 6"  (1.676 m)   Wt 54.4 kg   SpO2 100%   BMI 19.36 kg/m   Physical Exam Vitals signs and nursing note reviewed.  Constitutional:      General: She is not in acute distress.    Appearance: She is well-developed. She is not diaphoretic.  HENT:     Head: Normocephalic and atraumatic.  Neck:     Musculoskeletal: Normal range of motion and neck supple.  Cardiovascular:     Rate and Rhythm: Normal rate and regular rhythm.     Heart sounds: No murmur. No friction rub. No gallop.   Pulmonary:     Effort: Pulmonary effort is normal. No respiratory distress.     Breath sounds: Normal breath sounds. No wheezing.  Abdominal:     General: Bowel sounds are normal. There is no distension.     Palpations: Abdomen is soft.     Tenderness: There is abdominal tenderness. There is no guarding or rebound.     Comments: There is mild suprapubic tenderness.  Musculoskeletal: Normal range of motion.  Skin:    General: Skin is warm and dry.  Neurological:     Mental Status: She is alert and oriented to person, place, and time.      ED Treatments / Results  Labs (all labs ordered are listed, but only abnormal results are displayed) Labs Reviewed - No data to display  EKG None  Radiology No results found.  Procedures Procedures (including critical care time)  Medications Ordered in ED Medications - No data to display   Initial Impression / Assessment and Plan / ED Course  I have reviewed the triage vital signs and the nursing notes.  Pertinent labs & imaging results that were available during my care of the patient were reviewed by me and considered in my medical decision making (see  chart for details).  Patient presenting with complaints of urinary burning.  She was seen here approximately 1 week ago with similar symptoms.  She was treated with Keflex, however symptoms have not improved.  She also reports feeling dizzy.  Urinalysis shows persistent evidence of a urinary tract infection.  Patient will be switched to Levaquin and see if this helps.  She is also complaining of dizziness which I suspect is related to infection.  Her laboratory studies are essentially normal and her physical and neurologic exams are otherwise nonfocal.  At this point, I feel as though patient is appropriate for discharge with a change in antibiotics.  Final Clinical Impressions(s) /  ED Diagnoses   Final diagnoses:  None    ED Discharge Orders    None       Geoffery Lyonselo, Eliane Hammersmith, MD 06/12/19 [phone]

## 2019-06-12 NOTE — ED Notes (Signed)
Pt walked to car and d/c instructions reviewed with her family. Rx x 1 given for levaquin

## 2019-06-12 NOTE — ED Triage Notes (Signed)
Patient complains of burning with urination; states seen here 4 days ago and treated for a UTI and states she doesn't feel better.

## 2019-06-14 ENCOUNTER — Other Ambulatory Visit: Payer: Self-pay

## 2019-06-14 ENCOUNTER — Encounter (HOSPITAL_BASED_OUTPATIENT_CLINIC_OR_DEPARTMENT_OTHER): Payer: Self-pay | Admitting: Emergency Medicine

## 2019-06-14 ENCOUNTER — Emergency Department (HOSPITAL_BASED_OUTPATIENT_CLINIC_OR_DEPARTMENT_OTHER)
Admission: EM | Admit: 2019-06-14 | Discharge: 2019-06-14 | Disposition: A | Discharge status: Home / Self Care | Payer: Medicare HMO | Attending: Emergency Medicine | Admitting: Emergency Medicine

## 2019-06-14 DIAGNOSIS — Z8673 Personal history of transient ischemic attack (TIA), and cerebral infarction without residual deficits: Secondary | ICD-10-CM | POA: Insufficient documentation

## 2019-06-14 DIAGNOSIS — R112 Nausea with vomiting, unspecified: Secondary | ICD-10-CM | POA: Diagnosis present

## 2019-06-14 DIAGNOSIS — Z881 Allergy status to other antibiotic agents status: Secondary | ICD-10-CM | POA: Diagnosis not present

## 2019-06-14 DIAGNOSIS — Z9102 Food additives allergy status: Secondary | ICD-10-CM | POA: Insufficient documentation

## 2019-06-14 DIAGNOSIS — Z885 Allergy status to narcotic agent status: Secondary | ICD-10-CM | POA: Diagnosis not present

## 2019-06-14 DIAGNOSIS — R300 Dysuria: Secondary | ICD-10-CM | POA: Diagnosis not present

## 2019-06-14 DIAGNOSIS — Z79899 Other long term (current) drug therapy: Secondary | ICD-10-CM | POA: Diagnosis not present

## 2019-06-14 LAB — URINE CULTURE: Culture: 10000 — AB

## 2019-06-14 MED ORDER — NITROFURANTOIN MONOHYD MACRO 100 MG PO CAPS: 100.0000 mg | ORAL_CAPSULE | Freq: Two times a day (BID) | ORAL | 0 refills | Status: DC

## 2019-06-14 NOTE — ED Triage Notes (Signed)
Pt here with UTI and prescribed Levofoxacin and cannot take due to it giving her nausea and stomach discomfort.

## 2019-06-15 ENCOUNTER — Telehealth: Payer: Self-pay | Admitting: Emergency Medicine

## 2019-06-15 NOTE — Telephone Encounter (Signed)
Post ED Visit - Positive Culture Follow-up  Culture report reviewed by antimicrobial stewardship pharmacist: [place] Team []  Elenor Quinones, Pharm.D. []  Heide Guile, Pharm.D., BCPS AQ-ID []  Parks Neptune, Pharm.D., BCPS []  Alycia Rossetti, Pharm.D., BCPS []  [place], [place].D., BCPS, AAHIVP []  Legrand Como, Pharm.D., BCPS, AAHIVP []  Salome Arnt, PharmD, BCPS []  Johnnette Gourd, PharmD, BCPS []  Hughes Better, PharmD, BCPS []  Leeroy Cha, PharmD []  Laqueta Linden, PharmD, BCPS []  Albertina Parr, PharmD Gorden Harms PharmD  [place] Team []  Leodis Sias, PharmD []  Lindell Spar, PharmD []  Royetta Asal, PharmD []  Graylin Shiver, Rph []  Rema Fendt) Glennon Mac, PharmD []  Arlyn Dunning, PharmD []  Netta Cedars, PharmD []  Dia Sitter, PharmD []  Leone Haven, PharmD []  Gretta Arab, PharmD []  Theodis Shove, PharmD []  Peggyann Juba, PharmD []  Reuel Boom, PharmD   Positive urine culture Treated with levofloxacin, organism sensitive to the same and no further patient follow-up is required at this time.  Hazle Nordmann 06/15/2019, 8:28 AM

## 2019-06-20 ENCOUNTER — Emergency Department (HOSPITAL_BASED_OUTPATIENT_CLINIC_OR_DEPARTMENT_OTHER)
Admission: EM | Admit: 2019-06-20 | Discharge: 2019-06-20 | Disposition: A | Discharge status: Home / Self Care | Payer: Medicare HMO | Attending: Emergency Medicine | Admitting: Emergency Medicine

## 2019-06-20 ENCOUNTER — Other Ambulatory Visit: Payer: Self-pay

## 2019-06-20 ENCOUNTER — Emergency Department (HOSPITAL_BASED_OUTPATIENT_CLINIC_OR_DEPARTMENT_OTHER): Payer: Medicare HMO

## 2019-06-20 ENCOUNTER — Encounter (HOSPITAL_BASED_OUTPATIENT_CLINIC_OR_DEPARTMENT_OTHER): Payer: Self-pay | Admitting: *Deleted

## 2019-06-20 DIAGNOSIS — R102 Pelvic and perineal pain: Secondary | ICD-10-CM | POA: Diagnosis not present

## 2019-06-20 DIAGNOSIS — Z8673 Personal history of transient ischemic attack (TIA), and cerebral infarction without residual deficits: Secondary | ICD-10-CM | POA: Insufficient documentation

## 2019-06-20 DIAGNOSIS — R300 Dysuria: Secondary | ICD-10-CM | POA: Insufficient documentation

## 2019-06-20 HISTORY — DX: Urinary tract infection, site not specified: N39.0

## 2019-06-20 LAB — COMPREHENSIVE METABOLIC PANEL
ALT: 9 U/L (ref 0–44)
AST: 15 U/L (ref 15–41)
Albumin: 3.9 g/dL (ref 3.5–5.0)
Alkaline Phosphatase: 100 U/L (ref 38–126)
Anion gap: 10 (ref 5–15)
BUN: 9 mg/dL (ref 8–23)
CO2: 24 mmol/L (ref 22–32)
Calcium: 8.6 mg/dL — ABNORMAL LOW (ref 8.9–10.3)
Chloride: 102 mmol/L (ref 98–111)
Creatinine, Ser: 0.68 mg/dL (ref 0.44–1.00)
GFR calc Af Amer: >60 mL/min (ref >60)
GFR calc non Af Amer: >60 mL/min (ref >60)
Glucose, Bld: 94 mg/dL (ref 70–99)
Potassium: 3.6 mmol/L (ref 3.5–5.1)
Sodium: 136 mmol/L (ref 135–145)
Total Bilirubin: 0.2 mg/dL — ABNORMAL LOW (ref 0.3–1.2)
Total Protein: 6.6 g/dL (ref 6.5–8.1)

## 2019-06-20 LAB — URINALYSIS, ROUTINE W REFLEX MICROSCOPIC
Bilirubin Urine: NEGATIVE
Glucose, UA: NEGATIVE mg/dL
Hgb urine dipstick: NEGATIVE
Ketones, ur: NEGATIVE mg/dL
Leukocytes,Ua: NEGATIVE
Nitrite: NEGATIVE
Protein, ur: NEGATIVE mg/dL
Specific Gravity, Urine: <1.005 — ABNORMAL LOW (ref 1.005–1.030)
pH: 7 (ref 5.0–8.0)

## 2019-06-20 LAB — CBC WITH DIFFERENTIAL/PLATELET
Abs Immature Granulocytes: 0.03 10*3/uL (ref 0.00–0.07)
Basophils Absolute: 0.1 10*3/uL (ref 0.0–0.1)
Basophils Relative: 1 %
Eosinophils Absolute: 0.2 10*3/uL (ref 0.0–0.5)
Eosinophils Relative: 2 %
HCT: 44.3 % (ref 36.0–46.0)
Hemoglobin: 14 g/dL (ref 12.0–15.0)
Immature Granulocytes: 0 %
Lymphocytes Relative: 27 %
Lymphs Abs: 1.9 10*3/uL (ref 0.7–4.0)
MCH: 29.4 pg (ref 26.0–34.0)
MCHC: 31.6 g/dL (ref 30.0–36.0)
MCV: 92.9 fL (ref 80.0–100.0)
Monocytes Absolute: 0.7 10*3/uL (ref 0.1–1.0)
Monocytes Relative: 10 %
Neutro Abs: 4.1 10*3/uL (ref 1.7–7.7)
Neutrophils Relative %: 60 %
Platelets: 401 10*3/uL — ABNORMAL HIGH (ref 150–400)
RBC: 4.77 MIL/uL (ref 3.87–5.11)
RDW: 13.3 % (ref 11.5–15.5)
WBC: 7.1 10*3/uL (ref 4.0–10.5)
nRBC: 0 % (ref 0.0–0.2)

## 2019-06-20 LAB — WET PREP, GENITAL
Clue Cells Wet Prep HPF POC: NONE SEEN
Sperm: NONE SEEN
Trich, Wet Prep: NONE SEEN
WBC, Wet Prep HPF POC: NONE SEEN
Yeast Wet Prep HPF POC: NONE SEEN

## 2019-06-20 LAB — LIPASE, BLOOD: Lipase: 28 U/L (ref 11–51)

## 2019-06-20 MED ORDER — IOHEXOL 300 MG/ML  SOLN
100.0000 mL | Freq: Once | INTRAMUSCULAR | Status: AC | PRN
  Administered 2019-06-20: 100 mL via INTRAVENOUS

## 2019-06-20 NOTE — ED Notes (Signed)
ED Provider at bedside. 

## 2019-06-20 NOTE — ED Triage Notes (Addendum)
Pt seen here multiple times for dysuria since 6/24. She has been prescribed antibiotics but doesn't feel any better. C/o low abdominal pain, vaginal burning, nausea, chills, body aches, "feel very bad". Denies fever, vomiting, and vaginal discharge. States "my legs hurt so bad from the knees down I can't stand it"

## 2019-06-20 NOTE — ED Notes (Signed)
Patient transported to CT 

## 2019-06-20 NOTE — ED Provider Notes (Signed)
Emergency Department Provider Note   I have reviewed the triage vital signs and the nursing notes.   HISTORY  Chief Complaint Abdominal Pain (vaginal burning)   HPI Shannon Wise is a 85 y.o. female with PMH of prior CVA returns to the emergency department with continued lower abdominal pain, burning urination, and vaginal irritation.  She was seen on 6/24 and 7/3 with similar symptoms.  She was started on antibiotics during the first visit for UTI and then antibiotics were changed on the 7/3 visit.  Urine culture from 7/3 grew enterococcus was pansensitive.  Patient states that "those antibiotics were generic and were not any good."  He tells me that she never felt better after taking antibiotics.  She continues to have lower abdominal discomfort even without urination.  She has not noticed any vaginal discharge but states that she can see down there so is unsure.  No vomiting or diarrhea.  No fevers or chills.  No upper abdominal or chest pain.  Past Medical History:  Diagnosis Date  . Anxiety   . HOH (hard of hearing)   . Migraine   . Stroke (HCC)   . UTI (urinary tract infection)     Patient Active Problem List   Diagnosis Date Noted  . History of kidney stones 07/11/2018  . Sleep apnea 11/03/2016  . Anxiety 11/03/2016  . TMJ (temporomandibular joint disorder) 10/11/2016  . PVC (premature ventricular contraction) 03/16/2016  . Palpitations 03/16/2016  . Sensorineural hearing loss (SNHL), bilateral 01/04/2016  . Facet arthropathy 12/29/2014  . DDD (degenerative disc disease), lumbar 12/29/2014  . Cervical spine fracture (HCC) 03/25/2014    Past Surgical History:  Procedure Laterality Date  . ABDOMINAL HYSTERECTOMY    . BLADDER SURGERY      Allergies Codeine, Levaquin [levofloxacin], and Red dye  No family history on file.  Social History Social History   Tobacco Use  . Smoking status: Never Smoker  . Smokeless tobacco: Never Used  Substance Use Topics   . Alcohol use: No    Frequency: Never  . Drug use: No    Review of Systems  Constitutional: No fever/chills Eyes: No visual changes. ENT: No sore throat. Cardiovascular: Denies chest pain. Respiratory: Denies shortness of breath. Gastrointestinal: Positive lower abdominal pain.  No nausea, no vomiting.  No diarrhea.  No constipation. Genitourinary: Positive for dysuria. Musculoskeletal: Negative for back pain. Skin: Negative for rash. Neurological: Negative for headaches, focal weakness or numbness.  10-point ROS otherwise negative.  ____________________________________________   PHYSICAL EXAM:  VITAL SIGNS: ED Triage Vitals  Enc Vitals Group     BP 06/20/19 1352 137/82     Pulse Rate 06/20/19 1352 77     Resp 06/20/19 1352 (!) 22     Temp 06/20/19 1352 98.3 F (36.8 C)     Temp Source 06/20/19 1352 Oral     SpO2 06/20/19 1352 98 %     Pain Score 06/20/19 1355 8   Constitutional: Alert and oriented. Well appearing and in no acute distress. Hard of hearing.  Eyes: Conjunctivae are normal.  Head: Atraumatic. Nose: No congestion/rhinnorhea. Mouth/Throat: Mucous membranes are moist.  Neck: No stridor.   Cardiovascular: Normal rate, regular rhythm. Good peripheral circulation. Grossly normal heart sounds.   Respiratory: Normal respiratory effort.  No retractions. Lungs CTAB. Gastrointestinal: Soft with mild lower abdominal tenderness. No rebound or guarding. No distention.  Genitourinary: Exam performed after obtaining verbal consent and with chaperone. Normal external genitalia. No vaginal bleeding or  significant discharge. No skin breakdown or mucosal surface irritation.  Musculoskeletal: No lower extremity tenderness nor edema. No gross deformities of extremities. Neurologic:  Normal speech and language.  Skin:  Skin is warm, dry and intact. No rash noted.  ____________________________________________   LABS (all labs ordered are listed, but only abnormal results  are displayed)  Labs Reviewed  COMPREHENSIVE METABOLIC PANEL - Abnormal; Notable for the following components:      Result Value   Calcium 8.6 (*)    Total Bilirubin 0.2 (*)    All other components within normal limits  CBC WITH DIFFERENTIAL/PLATELET - Abnormal; Notable for the following components:   Platelets 401 (*)    All other components within normal limits  URINALYSIS, ROUTINE W REFLEX MICROSCOPIC - Abnormal; Notable for the following components:   Specific Gravity, Urine <1.005 (*)    All other components within normal limits  WET PREP, GENITAL  URINE CULTURE  LIPASE, BLOOD  GC/CHLAMYDIA PROBE AMP ([place]) NOT AT [facility]   ____________________________________________  RADIOLOGY  CT abdomen/pelvis pending.  ____________________________________________   PROCEDURES  Procedure(s) performed:   Procedures  None ____________________________________________   INITIAL IMPRESSION / ASSESSMENT AND PLAN / ED COURSE  Pertinent labs & imaging results that were available during my care of the patient were reviewed by me and considered in my medical decision making (see chart for details).   Patient presents to the emergency department for evaluation of lower abdominal pain.  Difficult to make out with history whether not this is mainly dysuria versus more constant pain versus vaginal itching/irritation.  Plan to extend her prior ED work-up to include CT abdomen pelvis given the patient's age and tenderness on exam along with pelvic exam. Patient is not sexually active.   CT, labs, and UA/Wet Prep pending. Care transferred to Dr. Stark Jock pending results.  ____________________________________________  FINAL CLINICAL IMPRESSION(S) / ED DIAGNOSES  Final diagnoses:  Dysuria  Pelvic pain     MEDICATIONS GIVEN DURING THIS VISIT:  Medications  iohexol (OMNIPAQUE) 300 MG/ML solution 100 mL (100 mLs Intravenous Contrast Given 06/20/19 1435)     Note:  This document was  prepared using Dragon voice recognition software and may include unintentional dictation errors.  Nanda Quinton, MD Emergency Medicine    Evelia Waskey, Wonda Old[facility], MD 06/20/19 [phone]

## 2019-06-20 NOTE — ED Provider Notes (Signed)
Care assumed from Dr. Dayna Barker at shift change.  Patient presents here with recurrent lower abdominal discomfort and vaginal burning and dysuria.  Patient has been seen here on multiple occasions recently with similar complaints.  Antibiotics do not seem to be improving her symptoms, however urinalysis today is clear.  Her laboratory studies are unremarkable and CT scan of the abdomen and pelvis is negative.  At this point I am uncertain as to the exact etiology of the patient's symptoms, however nothing appears emergent.  Patient appears appropriate for discharge with follow-up with her primary doctor.   Veryl Speak, MD 06/20/19 [phone]

## 2019-06-20 NOTE — Discharge Instructions (Addendum)
Drink plenty of fluids and get plenty of rest.  Follow-up with your primary doctor later this week if symptoms do not improve.

## 2019-06-22 LAB — URINE CULTURE: Culture: 60000 — AB

## 2019-06-23 LAB — GC/CHLAMYDIA PROBE AMP (~~LOC~~) NOT AT ARMC
Chlamydia: NEGATIVE
Neisseria Gonorrhea: NEGATIVE

## 2019-06-26 NOTE — ED Provider Notes (Signed)
[facility] EMERGENCY DEPARTMENT Provider Note   CSN: [id number] Arrival date & time: 06/14/19  1228     History   Chief Complaint Chief Complaint  Patient presents with  . Medication Problem    HPI Shannon Wise is a 85 y.o. female.     HPI Patient is an 85 year old female who presents to the emergency department with complaints of nausea and vomiting secondary to recent antibiotics for UTI.  Patient has a history of chronic urinary tract infections.  She does not remember the name of the antibiotic but reports that she began having some nausea and vomiting after taking the antibiotic.  She is requesting a new antibiotic.  She has a pansensitive enterococcus however this only grew out 10,000 units.  This was on 06/12/2019.  She has multiple medication allergies.  She denies abdominal pain.  No flank pain.  No fevers or chills.   Past Medical History:  Diagnosis Date  . Anxiety   . HOH (hard of hearing)   . Migraine   . Stroke (HCC)   . UTI (urinary tract infection)     Patient Active Problem List   Diagnosis Date Noted  . History of kidney stones 07/11/2018  . Sleep apnea 11/03/2016  . Anxiety 11/03/2016  . TMJ (temporomandibular joint disorder) 10/11/2016  . PVC (premature ventricular contraction) 03/16/2016  . Palpitations 03/16/2016  . Sensorineural hearing loss (SNHL), bilateral 01/04/2016  . Facet arthropathy 12/29/2014  . DDD (degenerative disc disease), lumbar 12/29/2014  . Cervical spine fracture (HCC) 03/25/2014    Past Surgical History:  Procedure Laterality Date  . ABDOMINAL HYSTERECTOMY    . BLADDER SURGERY       OB History   No obstetric history on file.      Home Medications    Prior to Admission medications   Medication Sig Start Date End Date Taking? Authorizing Provider  ALPRAZolam (XANAX PO) Take by mouth.   Yes [provider]  metoprolol succinate (TOPROL XL) 25 MG 24 hr tablet Take by mouth. 11/04/16  Yes  [provider]  phenazopyridine (PYRIDIUM) 200 MG tablet Take 1 tablet (200 mg total) by mouth 3 (three) times daily as needed for pain. 06/03/19  Yes Molpus, John, MD  nitrofurantoin, macrocrystal-monohydrate, (MACROBID) 100 MG capsule Take 1 capsule (100 mg total) by mouth 2 (two) times daily. 06/14/19   Azalia Bilisampos, Joanne Brander, MD  fluticasone (FLONASE) 50 MCG/ACT nasal spray 1 spray by Each Nare route daily for 10 days. 11/07/17 06/03/19  [provider]  loratadine (CLARITIN) 10 MG tablet Take by mouth. 11/07/17 06/14/19  [provider]    Family History History reviewed. No pertinent family history.  Social History Social History   Tobacco Use  . Smoking status: Never Smoker  . Smokeless tobacco: Never Used  Substance Use Topics  . Alcohol use: No    Frequency: Never  . Drug use: No     Allergies   Codeine, Levaquin [levofloxacin], and Red dye   Review of Systems Review of Systems  All other systems reviewed and are negative.    Physical Exam Updated Vital Signs BP (!) 143/94 (BP Location: Right Arm)   Pulse 94   Temp 98.4 F (36.9 C) (Oral)   Resp 16   SpO2 99%   Physical Exam Vitals signs and nursing note reviewed.  Constitutional:      General: She is not in acute distress.    Appearance: She is well-developed.  HENT:  Head: Normocephalic and atraumatic.  Neck:     Musculoskeletal: Normal range of motion.  Cardiovascular:     Rate and Rhythm: Normal rate and regular rhythm.     Heart sounds: Normal heart sounds.  Pulmonary:     Effort: Pulmonary effort is normal.     Breath sounds: Normal breath sounds.  Abdominal:     General: There is no distension.     Palpations: Abdomen is soft.     Tenderness: There is no abdominal tenderness.  Musculoskeletal: Normal range of motion.  Skin:    General: Skin is warm and dry.  Neurological:     Mental Status: She is alert and oriented to person, place, and time.  Psychiatric:         Judgment: Judgment normal.      ED Treatments / Results  Labs (all labs ordered are listed, but only abnormal results are displayed) Labs Reviewed - No data to display  EKG None  Radiology No results found.  Procedures Procedures (including critical care time)  Medications Ordered in ED Medications - No data to display   Initial Impression / Assessment and Plan / ED Course  I have reviewed the triage vital signs and the nursing notes.  Pertinent labs & imaging results that were available during my care of the patient were reviewed by me and considered in my medical decision making (see chart for details).        Patient will be switched to [place].  Exam here in the emergency department is reassuring.  No indication for additional significant work-up at this time.  Discharged home in good condition.  Primary care follow-up.  She understands return to the ER for new or worsening symptoms  Final Clinical Impressions(s) / ED Diagnoses   Final diagnoses:  Dysuria    ED Discharge Orders         Ordered    nitrofurantoin, macrocrystal-monohydrate, (MACROBID) 100 MG capsule  2 times daily     06/14/19 1317           Jola Schmidt, MD 06/26/19 [phone]

## 2019-07-03 ENCOUNTER — Other Ambulatory Visit: Payer: Self-pay

## 2019-07-03 ENCOUNTER — Encounter (HOSPITAL_BASED_OUTPATIENT_CLINIC_OR_DEPARTMENT_OTHER): Payer: Self-pay | Admitting: *Deleted

## 2019-07-03 ENCOUNTER — Emergency Department (HOSPITAL_BASED_OUTPATIENT_CLINIC_OR_DEPARTMENT_OTHER)
Admission: EM | Admit: 2019-07-03 | Discharge: 2019-07-03 | Disposition: A | Discharge status: Home / Self Care | Payer: Medicare HMO | Attending: Emergency Medicine | Admitting: Emergency Medicine

## 2019-07-03 DIAGNOSIS — N390 Urinary tract infection, site not specified: Secondary | ICD-10-CM | POA: Insufficient documentation

## 2019-07-03 DIAGNOSIS — N898 Other specified noninflammatory disorders of vagina: Secondary | ICD-10-CM | POA: Insufficient documentation

## 2019-07-03 DIAGNOSIS — R300 Dysuria: Secondary | ICD-10-CM | POA: Diagnosis present

## 2019-07-03 DIAGNOSIS — Z8673 Personal history of transient ischemic attack (TIA), and cerebral infarction without residual deficits: Secondary | ICD-10-CM | POA: Insufficient documentation

## 2019-07-03 LAB — URINALYSIS, MICROSCOPIC (REFLEX): WBC, UA: >50 WBC/hpf (ref 0–5)

## 2019-07-03 LAB — URINALYSIS, ROUTINE W REFLEX MICROSCOPIC
Glucose, UA: 100 mg/dL — AB
Ketones, ur: 15 mg/dL — AB
Nitrite: POSITIVE — AB
Protein, ur: NEGATIVE mg/dL
Specific Gravity, Urine: 1.02 (ref 1.005–1.030)
pH: 6 (ref 5.0–8.0)

## 2019-07-03 MED ORDER — CEPHALEXIN 500 MG PO CAPS: 500.0000 mg | ORAL_CAPSULE | Freq: Two times a day (BID) | ORAL | 0 refills | Status: AC

## 2019-07-03 NOTE — ED Notes (Signed)
Pt reports vaginal itching, pain, states has been taking prescription for UTI

## 2019-07-03 NOTE — Discharge Instructions (Signed)
You were seen in the ED today for burning when you pee Your symptoms are due to a urinary tract infection Please take antibiotics as prescribed for the next week In regards to your vaginal itching - please follow up with your OBGYN. You can also follow up at the [facility] (address and phone number attached). You may need estrogen cream for your itching

## 2019-07-03 NOTE — ED Triage Notes (Signed)
Vaginal itching and burning.

## 2019-07-03 NOTE — ED Provider Notes (Signed)
[facility] EMERG[facility] DEPARTMENT Provider Note   CSN: [id number] Arrival date & time: 07/03/19  1201    History   Chief Complaint Chief Complaint  Patient presents with  . Vaginal Itching and Burning    HPI Shannon Wise is a 85 y.o. female who presents to the ED today complaining of dysuria and vaginal itching x a couple of days. Pt reports she has had these symptoms on and off for sometime now and wants to know what is wrong. She states last time she was seen she was given "2 small green pills and a white pill and it made my symptoms go away." She states she has followed up with her PCP but he didn't do anything for her. Denies fever, chills, abdominal pain, nausea, vomiting, diarrhea, constipation, vaginal discharge, pelvic pain.   Per chart review:  06/24 - Seen in the ED and evaluated; diagnosed with UTI and prescribed Keflex 07/03 - Seen in the ED and evaluated; similar symptoms and no improvement with keflex; abx changed to Between 07/05 - Seen in the ED and evaluated; nausea and vomiting secondary to Levaquin; changed to Prent[place]  07/11 - Seen in the ED and evaluated; no change in symptoms despite 3 different abx; full work up at that time including labs, pelvic exam, U/A, and CT A/P; U/A without infection and CT A/P negative       Past Medical History:  Diagnosis Date  . Anxiety   . HOH (hard of hearing)   . Migraine   . Stroke ([place])   . UTI (urinary tract infection)     Patient Active Problem List   Diagnosis Date Noted  . History of kidney stones 07/11/2018  . Sleep apnea 11/03/2016  . Anxiety 11/03/2016  . TMJ (temporomandibular joint disorder) 10/11/2016  . PVC (premature ventricular contraction) 03/16/2016  . Palpitations 03/16/2016  . Sensorineural hearing loss (SNHL), bilateral 01/04/2016  . Facet arthropathy 12/29/2014  . DDD (degenerative disc disease), lumbar 12/29/2014  . Cervical spine fracture ([place]) 03/25/2014    Past Surgical  History:  Procedure Laterality Date  . ABDOMINAL HYSTERECTOMY    . BLADDER SURGERY       OB History   No obstetric history on file.      Home Medications    Prior to Admission medications   Medication Sig Start Date End Date Taking? Authorizing Provider  ALPRAZolam (XANAX PO) Take by mouth.    [provider]  cephALEXin (KEFLEX) 500 MG capsule Take 1 capsule (500 mg total) by mouth 2 (two) times daily for 7 days. 07/03/19 07/10/19  Alroy Bailiff, Jaquavius Hudler, PA-C  metoprolol succinate (TOPROL XL) 25 MG 24 hr tablet Take by mouth. 11/04/16   [provider]  nitrofurantoin, macrocrystal-monohydrate, (MACROBID) 100 MG capsule Take 1 capsule (100 mg total) by mouth 2 (two) times daily. 06/14/19   Jola Schmidt, MD  phenazopyridine (PYRIDIUM) 200 MG tablet Take 1 tablet (200 mg total) by mouth 3 (three) times daily as needed for pain. 06/03/19   Molpus, John, MD  fluticasone (FLONASE) 50 MCG/ACT nasal spray 1 spray by Each Nare route daily for 10 days. 11/07/17 06/03/19  [provider]  loratadine (CLARITIN) 10 MG tablet Take by mouth. 11/07/17 06/14/19  [provider]    Family History No family history on file.  Social History Social History   Tobacco Use  . Smoking status: Never Smoker  . Smokeless tobacco: Never Used  Substance Use Topics  . Alcohol use: No  Frequency: Never  . Drug use: No     Allergies   Codeine, Levaquin [levofloxacin], and Red dye   Review of Systems Review of Systems  Constitutional: Negative for chills and fever.  Gastrointestinal: Negative for abdominal pain, constipation, diarrhea, nausea and vomiting.  Genitourinary: Positive for dysuria.       + vaginal itching     Physical Exam Updated Vital Signs BP 127/87   Pulse 95   Temp 98.1 F (36.7 C) (Oral)   Resp 20   Ht 5\' 5"  (1.651 m)   Wt 54.4 kg   SpO2 97%   BMI 19.96 kg/m   Physical Exam Vitals signs and nursing note reviewed.  Constitutional:       Appearance: She is not ill-appearing.  HENT:     Head: Normocephalic and atraumatic.  Eyes:     Conjunctiva/sclera: Conjunctivae normal.  Neck:     Musculoskeletal: Neck supple.  Cardiovascular:     Rate and Rhythm: Normal rate and regular rhythm.  Pulmonary:     Effort: Pulmonary effort is normal.     Breath sounds: Normal breath sounds.  Abdominal:     General: Abdomen is flat.     Palpations: Abdomen is soft.     Tenderness: There is no abdominal tenderness. There is no right CVA tenderness, left CVA tenderness, guarding or rebound.  Genitourinary:    Comments: Chaperone present. Atrophic skin changes noted to vulvovaginal area. No obvious discharge appreciated.  Skin:    General: Skin is warm and dry.  Neurological:     Mental Status: She is alert.      ED Treatments / Results  Labs (all labs ordered are listed, but only abnormal results are displayed) Labs Reviewed  URINALYSIS, ROUTINE W REFLEX MICROSCOPIC - Abnormal; Notable for the following components:      Result Value   APPearance CLOUDY (*)    Glucose, UA 100 (*)    Hgb urine dipstick TRACE (*)    Bilirubin Urine MODERATE (*)    Ketones, ur 15 (*)    Nitrite POSITIVE (*)    Leukocytes,Ua SMALL (*)    All other components within normal limits  URINALYSIS, MICROSCOPIC (REFLEX) - Abnormal; Notable for the following components:   Bacteria, UA MANY (*)    All other components within normal limits  URINE CULTURE    EKG None  Radiology No results found.  Procedures Procedures (including critical care time)  Medications Ordered in ED Medications - No data to display   Initial Impression / Assessment and Plan / ED Course  I have reviewed the triage vital signs and the nursing notes.  Pertinent labs & imaging results that were available during my care of the patient were reviewed by me and considered in my medical decision making (see chart for details).    85 year old female who presents to the ED  with recurrent complaints of dysuria and vaginal burning. Has hx of chronic UTIs. Most recently treated 3 weeks ago for same. Full workup done in the ED 2 weeks ago without acute findings including negative wet prep and CT A/P. Pt is not sexually active. Denies pelvic pain or vaginal discharge. Do not feel she needs repeat pelvic exam today. Area visualized with chaperone present; atrophic skin changes noted. Pt may need to follow up with OBGYN for estrogen cream. She reports she has an OBGYN and will follow up with them. U/A collected today given concerns for recurrent UTI. Most recently grew 60,000  Strep viridans 07/11. 10,000 enterococcus faecalis 07/03.   U/A with leuks and nitrites. Sent for culture. Will treat with keflex today. No definitive pelvic exam performed although area inspected with chaperone present. No vaginal discharge visualized. Pt does have atrophic changes today; will give referral to OBGYN for questionable estrogen therapy. Pt reports she used to be on this in the past but thought she didn't need it anymore so she stopped using it. Pt advised to follow up with PCP as well. She is in agreement with plan at this time and stable for discharge home.       Final Clinical Impressions(s) / ED Diagnoses   Final diagnoses:  Urinary tract infection without hematuria, site unspecified    ED Discharge Orders         Ordered    cephALEXin (KEFLEX) 500 MG capsule  2 times daily     07/03/19 1418           Tanda RockersVenter, Alver Leete, PA-C 07/03/19 2209    Tilden Fossaees, Elizabeth, MD 07/04/19 1400

## 2019-07-05 LAB — URINE CULTURE: Culture: >=100000 — AB

## 2019-07-06 ENCOUNTER — Telehealth: Payer: Self-pay | Admitting: Emergency Medicine

## 2019-07-06 NOTE — Telephone Encounter (Signed)
Post ED Visit - Positive Culture Follow-up  Culture report reviewed by antimicrobial stewardship pharmacist: [facility] []  [address], Pharm.D. []  Heide Guile, Pharm.D., BCPS AQ-ID []  Parks Neptune, Pharm.D., BCPS []  Alycia Rossetti, Pharm.D., BCPS []  [place], [place].D., BCPS, AAHIVP []  Legrand Como, Pharm.D., BCPS, AAHIVP []  Salome Arnt, PharmD, BCPS []  Johnnette Gourd, PharmD, BCPS []  Hughes Better, PharmD, BCPS []  Leeroy Cha, PharmD []  Laqueta Linden, PharmD, BCPS []  Albertina Parr, PharmD Elicia Lamp PharmD  [facility] Team []  Leodis Sias, PharmD []  Lindell Spar, PharmD []  Royetta Asal, PharmD []  Graylin Shiver, Rph []  Rema Fendt) Glennon Mac, PharmD []  Arlyn Dunning, PharmD []  Netta Cedars, PharmD []  Dia Sitter, PharmD []  Leone Haven, PharmD []  Gretta Arab, PharmD []  Theodis Shove, PharmD []  Peggyann Juba, PharmD []  Reuel Boom, PharmD   Positive urine culture Treated with cephalexin, organism sensitive to the same and no further patient follow-up is required at this time.  Hazle Nordmann 07/06/2019, 10:14 AM

## 2019-07-11 ENCOUNTER — Encounter (HOSPITAL_BASED_OUTPATIENT_CLINIC_OR_DEPARTMENT_OTHER): Payer: Self-pay | Admitting: Emergency Medicine

## 2019-07-11 ENCOUNTER — Other Ambulatory Visit: Payer: Self-pay

## 2019-07-11 ENCOUNTER — Emergency Department (HOSPITAL_BASED_OUTPATIENT_CLINIC_OR_DEPARTMENT_OTHER)
Admission: EM | Admit: 2019-07-11 | Discharge: 2019-07-11 | Disposition: A | Discharge status: Home / Self Care | Payer: Medicare HMO | Attending: Emergency Medicine | Admitting: Emergency Medicine

## 2019-07-11 DIAGNOSIS — R309 Painful micturition, unspecified: Secondary | ICD-10-CM | POA: Diagnosis not present

## 2019-07-11 DIAGNOSIS — R300 Dysuria: Secondary | ICD-10-CM | POA: Diagnosis not present

## 2019-07-11 DIAGNOSIS — Z8673 Personal history of transient ischemic attack (TIA), and cerebral infarction without residual deficits: Secondary | ICD-10-CM | POA: Diagnosis not present

## 2019-07-11 LAB — URINALYSIS, ROUTINE W REFLEX MICROSCOPIC
Bilirubin Urine: NEGATIVE
Glucose, UA: NEGATIVE mg/dL
Hgb urine dipstick: NEGATIVE
Ketones, ur: NEGATIVE mg/dL
Leukocytes,Ua: NEGATIVE
Nitrite: NEGATIVE
Protein, ur: NEGATIVE mg/dL
Specific Gravity, Urine: 1.02 (ref 1.005–1.030)
pH: 6 (ref 5.0–8.0)

## 2019-07-11 MED ORDER — OXYBUTYNIN CHLORIDE 5 MG PO TABS: 5.0000 mg | ORAL_TABLET | Freq: Two times a day (BID) | ORAL | 0 refills | Status: AC

## 2019-07-11 NOTE — ED Notes (Signed)
Ambulated to BR, gait steady 

## 2019-07-11 NOTE — ED Notes (Signed)
ED Provider at bedside. 

## 2019-07-11 NOTE — ED Notes (Signed)
Unable to urinate at this time, given po fluids

## 2019-07-11 NOTE — ED Provider Notes (Signed)
[facility] EMERGENCY DEPARTMENT Provider Note   CSN: [id number] Arrival date & time: 07/11/19  1129    History   Chief Complaint Chief Complaint  Patient presents with  . Dysuria    HPI Shannon Wise is a 85 y.o. female.     HPI   85yo female with history of CVA, UTI, anxiety, hard of hearing, presents with concern for pain with urination.  Has had multiple other presentations for similar, at times with diagnosis of UTI, other times without signs of infection, with 4 visits in July for same and most recent UTI diagnosis on 7/24 treated with keflex.  Reports burning with urination for 2-3 days. Has had back pain longer than that. No nausea/vomiting. No diarrhea.  No fevers.  No numbness/weakness. No falls or trauma.  No new soaps.  Hx limited by hearing, obtained writing questions for pt to answer   Past Medical History:  Diagnosis Date  . Anxiety   . HOH (hard of hearing)   . Migraine   . Stroke ([facility])   . UTI (urinary tract infection)     Patient Active Problem List   Diagnosis Date Noted  . History of kidney stones 07/11/2018  . Sleep apnea 11/03/2016  . Anxiety 11/03/2016  . TMJ (temporomandibular joint disorder) 10/11/2016  . PVC (premature ventricular contraction) 03/16/2016  . Palpitations 03/16/2016  . Sensorineural hearing loss (SNHL), bilateral 01/04/2016  . Facet arthropathy 12/29/2014  . DDD (degenerative disc disease), lumbar 12/29/2014  . Cervical spine fracture ([facility]) 03/25/2014    Past Surgical History:  Procedure Laterality Date  . ABDOMINAL HYSTERECTOMY    . BLADDER SURGERY       OB History   No obstetric history on file.      Home Medications    Prior to Admission medications   Medication Sig Start Date End Date Taking? Authorizing Provider  oxybutynin (DITROPAN) 5 MG tablet Take 1 tablet (5 mg total) by mouth 2 (two) times daily for 2 days. 07/11/19 07/13/19  Alvira MondaySchlossman, Panhia Karl, MD  fluticasone (FLONASE) 50 MCG/ACT nasal spray  1 spray by Each Nare route daily for 10 days. 11/07/17 06/03/19  [provider]  loratadine (CLARITIN) 10 MG tablet Take by mouth. 11/07/17 06/14/19  [provider]    Family History No family history on file.  Social History Social History   Tobacco Use  . Smoking status: Never Smoker  . Smokeless tobacco: Never Used  Substance Use Topics  . Alcohol use: No    Frequency: Never  . Drug use: No     Allergies   Codeine, Levaquin [levofloxacin], and Red dye   Review of Systems Review of Systems  Constitutional: Negative for fever.  Respiratory: Negative for shortness of breath.   Cardiovascular: Negative for chest pain.  Gastrointestinal: Negative for abdominal pain, nausea and vomiting.  Genitourinary: Positive for dysuria.  Musculoskeletal: Positive for back pain.  Skin: Negative for rash.  Neurological: Negative for weakness and numbness.     Physical Exam Updated Vital Signs BP 132/69   Pulse 87   Temp 98.4 F (36.9 C) (Oral)   Resp 16   SpO2 97%   Physical Exam Vitals signs and nursing note reviewed.  Constitutional:      General: She is not in acute distress.    Appearance: She is well-developed. She is not diaphoretic.  HENT:     Head: Normocephalic and atraumatic.  Eyes:     Conjunctiva/sclera: Conjunctivae normal.  Neck:  Musculoskeletal: Normal range of motion.  Cardiovascular:     Rate and Rhythm: Normal rate and regular rhythm.  Pulmonary:     Effort: Pulmonary effort is normal. No respiratory distress.  Abdominal:     General: There is no distension.     Palpations: Abdomen is soft.     Tenderness: There is no abdominal tenderness. There is no guarding.  Musculoskeletal:        General: No tenderness.  Skin:    General: Skin is warm and dry.     Findings: No erythema or rash.  Neurological:     Mental Status: She is alert and oriented to person, place, and time.      ED Treatments / Results  Labs (all labs  ordered are listed, but only abnormal results are displayed) Labs Reviewed  URINE CULTURE  URINALYSIS, ROUTINE W REFLEX MICROSCOPIC    EKG None  Radiology No results found.  Procedures Procedures (including critical care time)  Medications Ordered in ED Medications - No data to display   Initial Impression / Assessment and Plan / ED Course  I have reviewed the triage vital signs and the nursing notes.  Pertinent labs & imaging results that were available during my care of the patient were reviewed by me and considered in my medical decision making (see chart for details).        85yo female with history of CVA, UTI, anxiety, hard of hearing, presents with concern for pain with urination.  Multiple ED presentations for same.  Well appearing, afebrile, does not report urinary retention, no weakness, no sign of back pain emergency or intraabdominal emergency.  No signs of vaginitis.  UA today without infection. Given recurrence of symptoms consider other bladder spasm, given oxybutynin rx, sent urine cx. Patient discharged in stable condition with understanding of reasons to return.   Final Clinical Impressions(s) / ED Diagnoses   Final diagnoses:  Dysuria    ED Discharge Orders         Ordered    oxybutynin (DITROPAN) 5 MG tablet  2 times daily     07/11/19 1341           Gareth Morgan, MD 07/12/19 2234

## 2019-07-11 NOTE — ED Notes (Addendum)
Patient has been wanting to go home stating that her daughter has to work.  Encouraged her if I can do in and out cath to get a urine.  She finally agreed after a long talk.  In and out cath done with Kristin's assistance.  Patient tolerated it well.

## 2019-07-11 NOTE — ED Triage Notes (Signed)
Dysuria x 2 days , lower back pain

## 2019-07-12 LAB — URINE CULTURE: Culture: NO GROWTH

## 2019-07-26 ENCOUNTER — Emergency Department (HOSPITAL_BASED_OUTPATIENT_CLINIC_OR_DEPARTMENT_OTHER)
Admission: EM | Admit: 2019-07-26 | Discharge: 2019-07-26 | Disposition: A | Discharge status: Home / Self Care | Payer: Medicare HMO | Attending: Emergency Medicine | Admitting: Emergency Medicine

## 2019-07-26 ENCOUNTER — Encounter (HOSPITAL_BASED_OUTPATIENT_CLINIC_OR_DEPARTMENT_OTHER): Payer: Self-pay | Admitting: Emergency Medicine

## 2019-07-26 ENCOUNTER — Emergency Department (HOSPITAL_BASED_OUTPATIENT_CLINIC_OR_DEPARTMENT_OTHER): Payer: Medicare HMO

## 2019-07-26 DIAGNOSIS — R0789 Other chest pain: Secondary | ICD-10-CM | POA: Diagnosis present

## 2019-07-26 DIAGNOSIS — Z8673 Personal history of transient ischemic attack (TIA), and cerebral infarction without residual deficits: Secondary | ICD-10-CM | POA: Insufficient documentation

## 2019-07-26 DIAGNOSIS — R0989 Other specified symptoms and signs involving the circulatory and respiratory systems: Secondary | ICD-10-CM | POA: Diagnosis not present

## 2019-07-26 DIAGNOSIS — Z79899 Other long term (current) drug therapy: Secondary | ICD-10-CM | POA: Diagnosis not present

## 2019-07-26 DIAGNOSIS — R1110 Vomiting, unspecified: Secondary | ICD-10-CM | POA: Insufficient documentation

## 2019-07-26 DIAGNOSIS — R079 Chest pain, unspecified: Secondary | ICD-10-CM

## 2019-07-26 DIAGNOSIS — T17308A Unspecified foreign body in larynx causing other injury, initial encounter: Secondary | ICD-10-CM

## 2019-07-26 LAB — COMPREHENSIVE METABOLIC PANEL
ALT: 12 U/L (ref 0–44)
AST: 16 U/L (ref 15–41)
Albumin: 4.3 g/dL (ref 3.5–5.0)
Alkaline Phosphatase: 98 U/L (ref 38–126)
Anion gap: 10 (ref 5–15)
BUN: 7 mg/dL — ABNORMAL LOW (ref 8–23)
CO2: 25 mmol/L (ref 22–32)
Calcium: 9.6 mg/dL (ref 8.9–10.3)
Chloride: 102 mmol/L (ref 98–111)
Creatinine, Ser: 0.69 mg/dL (ref 0.44–1.00)
GFR calc Af Amer: >60 mL/min (ref >60)
GFR calc non Af Amer: >60 mL/min (ref >60)
Glucose, Bld: 124 mg/dL — ABNORMAL HIGH (ref 70–99)
Potassium: 3.3 mmol/L — ABNORMAL LOW (ref 3.5–5.1)
Sodium: 137 mmol/L (ref 135–145)
Total Bilirubin: 0.5 mg/dL (ref 0.3–1.2)
Total Protein: 7.3 g/dL (ref 6.5–8.1)

## 2019-07-26 LAB — CBC WITH DIFFERENTIAL/PLATELET
Abs Immature Granulocytes: 0.01 10*3/uL (ref 0.00–0.07)
Basophils Absolute: 0.1 10*3/uL (ref 0.0–0.1)
Basophils Relative: 2 %
Eosinophils Absolute: 0.2 10*3/uL (ref 0.0–0.5)
Eosinophils Relative: 3 %
HCT: 45.9 % (ref 36.0–46.0)
Hemoglobin: 14.7 g/dL (ref 12.0–15.0)
Immature Granulocytes: 0 %
Lymphocytes Relative: 23 %
Lymphs Abs: 1.6 10*3/uL (ref 0.7–4.0)
MCH: 29.2 pg (ref 26.0–34.0)
MCHC: 32 g/dL (ref 30.0–36.0)
MCV: 91.1 fL (ref 80.0–100.0)
Monocytes Absolute: 0.5 10*3/uL (ref 0.1–1.0)
Monocytes Relative: 7 %
Neutro Abs: 4.5 10*3/uL (ref 1.7–7.7)
Neutrophils Relative %: 65 %
Platelets: 413 10*3/uL — ABNORMAL HIGH (ref 150–400)
RBC: 5.04 MIL/uL (ref 3.87–5.11)
RDW: 13.2 % (ref 11.5–15.5)
WBC: 6.9 10*3/uL (ref 4.0–10.5)
nRBC: 0 % (ref 0.0–0.2)

## 2019-07-26 LAB — TROPONIN I (HIGH SENSITIVITY)
Troponin I (High Sensitivity): 5 ng/L (ref <18)
Troponin I (High Sensitivity): 5 ng/L (ref <18)

## 2019-07-26 LAB — LIPASE, BLOOD: Lipase: 35 U/L (ref 11–51)

## 2019-07-26 NOTE — ED Triage Notes (Signed)
Pt c/o feeling like "something is stuck and cant swallow it down" since 12pm today. Pt points to chest. Denies eating today, states she doesn't feel like eating. Pt reports vomiting 4 times. Denies chest pain. Pt reports mid-sternal pressure

## 2019-07-26 NOTE — ED Provider Notes (Signed)
[facility] EMERGENCY DEPARTMENT Provider Note   CSN: [id number] Arrival date & time: 07/26/19  1621     History   Chief Complaint Chief Complaint  Patient presents with   Chest Pain    HPI Shannon LenisFlorence L Mainer is a 85 y.o. female.     The history is provided by the patient and medical records. No language interpreter was used.  Chest Pain Pain location:  Substernal area Pain quality: aching   Pain radiates to:  Neck Pain severity:  Severe Onset quality:  Sudden Duration:  5 hours Timing:  Constant Progression:  Unchanged Chronicity:  Recurrent Context: eating   Relieved by:  Nothing Worsened by:  Nothing Ineffective treatments:  None tried Associated symptoms: dysphagia and vomiting   Associated symptoms: no abdominal pain, no altered mental status, no back pain, no cough, no diaphoresis, no fatigue, no fever, no headache, no lower extremity edema, no nausea, no near-syncope, no palpitations, no shortness of breath and no syncope     Past Medical History:  Diagnosis Date   Anxiety    HOH (hard of hearing)    Migraine    Stroke [facility](HCC)    UTI (urinary tract infection)     Patient Active Problem List   Diagnosis Date Noted   History of kidney stones 07/11/2018   Sleep apnea 11/03/2016   Anxiety 11/03/2016   TMJ (temporomandibular joint disorder) 10/11/2016   PVC (premature ventricular contraction) 03/16/2016   Palpitations 03/16/2016   Sensorineural hearing loss (SNHL), bilateral 01/04/2016   Facet arthropathy 12/29/2014   DDD (degenerative disc disease), lumbar 12/29/2014   Cervical spine fracture (HCC) 03/25/2014    Past Surgical History:  Procedure Laterality Date   ABDOMINAL HYSTERECTOMY     BLADDER SURGERY       OB History   No obstetric history on file.      Home Medications    Prior to Admission medications   Medication Sig Start Date End Date Taking? Authorizing Provider  fluticasone (FLONASE) 50 MCG/ACT nasal  spray 1 spray by Each Nare route daily for 10 days. 11/07/17 06/03/19  [provider]  loratadine (CLARITIN) 10 MG tablet Take by mouth. 11/07/17 06/14/19  [provider]    Family History History reviewed. No pertinent family history.  Social History Social History   Tobacco Use   Smoking status: Never Smoker   Smokeless tobacco: Never Used  Substance Use Topics   Alcohol use: No    Frequency: Never   Drug use: No     Allergies   Codeine, Levaquin [levofloxacin], and Red dye   Review of Systems Review of Systems  Constitutional: Negative for chills, diaphoresis, fatigue and fever.  HENT: Positive for trouble swallowing. Negative for congestion and sore throat.   Respiratory: Positive for choking. Negative for cough, chest tightness, shortness of breath and wheezing.   Cardiovascular: Positive for chest pain. Negative for palpitations, leg swelling, syncope and near-syncope.  Gastrointestinal: Positive for vomiting. Negative for abdominal pain, constipation, diarrhea and nausea.  Genitourinary: Negative for flank pain.  Musculoskeletal: Negative for back pain, neck pain and neck stiffness.  Skin: Negative for rash and wound.  Neurological: Negative for headaches.  All other systems reviewed and are negative.    Physical Exam Updated Vital Signs BP (!) 141/96 (BP Location: Right Arm)    Pulse 95    Temp 98.9 F (37.2 C) (Oral)    Resp 18    Ht 5\' 4"  (1.626 m)    Wt  61.2 kg    SpO2 100%    BMI 23.17 kg/m   Physical Exam Vitals signs and nursing note reviewed.  Constitutional:      General: She is not in acute distress.    Appearance: She is well-developed. She is not ill-appearing, toxic-appearing or diaphoretic.  HENT:     Head: Normocephalic and atraumatic.     Right Ear: External ear normal.     Left Ear: External ear normal.     Nose: Nose normal.     Mouth/Throat:     Pharynx: No oropharyngeal exudate.  Eyes:     Conjunctiva/sclera:  Conjunctivae normal.     Pupils: Pupils are equal, round, and reactive to light.  Neck:     Musculoskeletal: Normal range of motion and neck supple.  Cardiovascular:     Rate and Rhythm: Normal rate and regular rhythm.     Heart sounds: Normal heart sounds. No murmur.  Pulmonary:     Effort: No respiratory distress.     Breath sounds: No stridor. No decreased breath sounds, wheezing, rhonchi or rales.  Abdominal:     General: There is no distension.     Tenderness: There is no abdominal tenderness. There is no rebound.  Musculoskeletal:     Right lower leg: She exhibits no tenderness. No edema.     Left lower leg: She exhibits no tenderness. No edema.  Skin:    General: Skin is warm.     Capillary Refill: Capillary refill takes less than 2 seconds.     Findings: No erythema or rash.  Neurological:     General: No focal deficit present.     Mental Status: She is alert.     Motor: No abnormal muscle tone.     Coordination: Coordination normal.     Deep Tendon Reflexes: Reflexes are normal and symmetric.  Psychiatric:        Mood and Affect: Mood normal.      ED Treatments / Results  Labs (all labs ordered are listed, but only abnormal results are displayed) Labs Reviewed  COMPREHENSIVE METABOLIC PANEL - Abnormal; Notable for the following components:      Result Value   Potassium 3.3 (*)    Glucose, Bld 124 (*)    BUN 7 (*)    All other components within normal limits  CBC WITH DIFFERENTIAL/PLATELET - Abnormal; Notable for the following components:   Platelets 413 (*)    All other components within normal limits  LIPASE, BLOOD  TROPONIN I (HIGH SENSITIVITY)  TROPONIN I (HIGH SENSITIVITY)    EKG EKG Interpretation  Date/Time:  Sunday July 26 2019 16:40:33 EDT Ventricular Rate:  90 PR Interval:    QRS Duration: 95 QT Interval:  372 QTC Calculation: 456 R Axis:   -78 Text Interpretation:  Sinus rhythm Left anterior fascicular block Borderline T wave  abnormalities When compared to prior, no significant changes seen.  No STEMI Confirmed by Theda Belfastegeler, Chris ([id number]) on 07/26/2019 4:46:41 PM   Radiology Dg Chest 2 View  Result Date: 07/26/2019 CLINICAL DATA:  Chest pain EXAM: CHEST - 2 VIEW COMPARISON:  January 02, 2019 FINDINGS: Heart size is stable. Aortic calcifications are noted. There is no focal infiltrate. No pneumothorax. No definite radiographic evidence for pneumomediastinum. There is no displaced fracture. There is scarring and atelectasis at the lung bases. The lungs are hyperexpanded which can be seen in patients with COPD. There is a levoscoliosis centered at the lower thoracic spine. There are degenerative  changes throughout the visualized thoracic spine. IMPRESSION: No active cardiopulmonary disease. Electronically Signed   By: Katherine Mantlehristopher  Green M.D.   On: 07/26/2019 18:17    Procedures Procedures (including critical care time)  Medications Ordered in ED Medications - No data to display   Initial Impression / Assessment and Plan / ED Course  I have reviewed the triage vital signs and the nursing notes.  Pertinent labs & imaging results that were available during my care of the patient were reviewed by me and considered in my medical decision making (see chart for details).        Shannon Wise is a 85 y.o. female with past medical history significant for anxiety, prior stroke, hard of hearing, and migraines who presents with chest pain and sensation of food stuck in her esophagus.  She reports that she has had "food stuck" 3 or 4 times in her life most recently several years ago.  She reports it always passed on its own without difficulty.  She is never had to come to the emergency department to get evaluated for this.  She reports that she had not eaten anything since last night when she ate a personal pan pizza when she tried to eat at noon today with a bacon and tomato biscuit.  She reports that she felt that something  was stuck in her esophagus and when she tried to eat the biscuit she was unable to do so.  She reports that she was unable to tolerate her own saliva for several hours but is now starting to tolerate her saliva.  She does not think she will able to swallow anything at this time but denies difficulty breathing.  She denies the pressure in her chest as related to her swallowing.  It is not exertional or pleuritic.  She denies diaphoresis.  She reports she cannot vomit as she feels that everything is stuck.  She denies constant nausea.  She reports no recent trauma.  She denies any urinary symptoms or GI symptoms.  No other cough, congestion, fevers, or chills.  On exam, lungs are clear.  Chest is nontender.  Abdomen is nontender.  Neck is nontender.  Oropharyngeal exam is unremarkable.  No stridor on exam.  Exam otherwise reassuring.  Clinically I suspect patient may have a food impaction or food bolus causing her symptoms.  Unclear if this was from last night's pizza or on her first bite getting stuck with a biscuit.  Patient is a somewhat difficult historian to get the timeline and what happened in order.  Patient is insistent that something is stuck in her throat at this time.  Patient will have screening lab work and chest x-ray to look for other etiologies of chest discomfort given her age and other history.  I do not see a history of aortic aneurysm or dissection which would be pushing on her esophagus alert chest x-ray will help look for widened mediastinum.  If work-up is otherwise reassuring, will attempt glucagon and a Coca-Cola swallow.  If we are unsuccessful, anticipate touching base with gastroenterology as she may need transfer for management.  8:22 PM Troponin was negative twice.  Electrolytes showed mild hypokalemia but was otherwise reassuring.  Chest x-ray reassuring.  After otherwise reassuring work-up, decision made to try Coca-Cola.  Patient was able to drink Coca-Cola without  difficulty and nothing got stuck.  I suspect she passed the food bolus.  Daughter arrived and reports that patient has had multiple esophageal dilations in the  past.  I suspect the patient may have some scar tissue or need redilation.  Patient will call and follow-up with her gastroenterologist for further management and was instructed use a liquid diet until and so she does not choke again.  Patient agrees with plan of care and is feeling better.  Patient discharged in good condition with resolution of her choking and discomfort.  Final Clinical Impressions(s) / ED Diagnoses   Final diagnoses:  Choking, initial encounter  Chest pain, unspecified type    ED Discharge Orders    None      Clinical Impression: 1. Choking, initial encounter   2. Chest pain, unspecified type     Disposition: Discharge  Condition: Good  I have discussed the results, Dx and Tx plan with the pt(& family if present). He/she/they expressed understanding and agree(s) with the plan. Discharge instructions discussed at great length. Strict return precautions discussed and pt &/or family have verbalized understanding of the instructions. No further questions at time of discharge.    New Prescriptions   No medications on file    Follow Up: Ronald Lobo, MD [address] [place] [address] [phone]     [place] [address] [id number] Y[facility] [place] [phone]    Your Gastroenterologist        Brayden Betters, Gwenyth Allegra, MD 07/26/19 [phone]

## 2019-07-26 NOTE — ED Notes (Signed)
Provided discharge instructions. Pt and family very hoh. Pt verbalizes understanding of plan of care, reports "choking" sensation is improved.

## 2019-07-26 NOTE — ED Notes (Signed)
ED Provider at bedside. 

## 2019-07-26 NOTE — Discharge Instructions (Signed)
Based on our history and exam, we suspect he may have choked and something was stuck in your esophagus.  After ruling out other problems and treating you with Coca-Cola, you were able to get fluids down.  Thus, we do not suspect you still have a food impaction or a food bolus at this time.  After discussing with your daughter that you have had esophageal dilations in the past, we feel you need to see her GI doctor in the next week or 2 for reevaluation.  Please drink a liquid diet until you see your GI team.  If any symptoms change or worsen, please return to the nearest emergency department.

## 2019-08-02 ENCOUNTER — Emergency Department (HOSPITAL_BASED_OUTPATIENT_CLINIC_OR_DEPARTMENT_OTHER)
Admission: EM | Admit: 2019-08-02 | Discharge: 2019-08-02 | Disposition: A | Discharge status: Home / Self Care | Payer: Medicare HMO | Attending: Emergency Medicine | Admitting: Emergency Medicine

## 2019-08-02 ENCOUNTER — Emergency Department (HOSPITAL_BASED_OUTPATIENT_CLINIC_OR_DEPARTMENT_OTHER): Payer: Medicare HMO

## 2019-08-02 ENCOUNTER — Encounter (HOSPITAL_BASED_OUTPATIENT_CLINIC_OR_DEPARTMENT_OTHER): Payer: Self-pay | Admitting: Emergency Medicine

## 2019-08-02 ENCOUNTER — Other Ambulatory Visit: Payer: Self-pay

## 2019-08-02 DIAGNOSIS — Z8673 Personal history of transient ischemic attack (TIA), and cerebral infarction without residual deficits: Secondary | ICD-10-CM | POA: Insufficient documentation

## 2019-08-02 DIAGNOSIS — R109 Unspecified abdominal pain: Secondary | ICD-10-CM

## 2019-08-02 LAB — CBC WITH DIFFERENTIAL/PLATELET
Abs Immature Granulocytes: 0.07 10*3/uL (ref 0.00–0.07)
Basophils Absolute: 0.1 10*3/uL (ref 0.0–0.1)
Basophils Relative: 1 %
Eosinophils Absolute: 0.3 10*3/uL (ref 0.0–0.5)
Eosinophils Relative: 3 %
HCT: 46.5 % — ABNORMAL HIGH (ref 36.0–46.0)
Hemoglobin: 14.4 g/dL (ref 12.0–15.0)
Immature Granulocytes: 1 %
Lymphocytes Relative: 23 %
Lymphs Abs: 1.8 10*3/uL (ref 0.7–4.0)
MCH: 29.2 pg (ref 26.0–34.0)
MCHC: 31 g/dL (ref 30.0–36.0)
MCV: 94.3 fL (ref 80.0–100.0)
Monocytes Absolute: 0.6 10*3/uL (ref 0.1–1.0)
Monocytes Relative: 8 %
Neutro Abs: 5 10*3/uL (ref 1.7–7.7)
Neutrophils Relative %: 64 %
Platelets: 263 10*3/uL (ref 150–400)
RBC: 4.93 MIL/uL (ref 3.87–5.11)
RDW: 13.5 % (ref 11.5–15.5)
WBC: 7.8 10*3/uL (ref 4.0–10.5)
nRBC: 0 % (ref 0.0–0.2)

## 2019-08-02 LAB — URINALYSIS, ROUTINE W REFLEX MICROSCOPIC
Bilirubin Urine: NEGATIVE
Glucose, UA: NEGATIVE mg/dL
Hgb urine dipstick: NEGATIVE
Ketones, ur: NEGATIVE mg/dL
Leukocytes,Ua: NEGATIVE
Nitrite: NEGATIVE
Protein, ur: NEGATIVE mg/dL
Specific Gravity, Urine: 1.015 (ref 1.005–1.030)
pH: 6 (ref 5.0–8.0)

## 2019-08-02 LAB — COMPREHENSIVE METABOLIC PANEL
ALT: 12 U/L (ref 0–44)
AST: 17 U/L (ref 15–41)
Albumin: 4.2 g/dL (ref 3.5–5.0)
Alkaline Phosphatase: 83 U/L (ref 38–126)
Anion gap: 11 (ref 5–15)
BUN: 9 mg/dL (ref 8–23)
CO2: 23 mmol/L (ref 22–32)
Calcium: 9.3 mg/dL (ref 8.9–10.3)
Chloride: 102 mmol/L (ref 98–111)
Creatinine, Ser: 0.79 mg/dL (ref 0.44–1.00)
GFR calc Af Amer: >60 mL/min (ref >60)
GFR calc non Af Amer: >60 mL/min (ref >60)
Glucose, Bld: 102 mg/dL — ABNORMAL HIGH (ref 70–99)
Potassium: 3.9 mmol/L (ref 3.5–5.1)
Sodium: 136 mmol/L (ref 135–145)
Total Bilirubin: 0.6 mg/dL (ref 0.3–1.2)
Total Protein: 7 g/dL (ref 6.5–8.1)

## 2019-08-02 MED ORDER — ACETAMINOPHEN 325 MG PO TABS
650.0000 mg | ORAL_TABLET | Freq: Once | ORAL | Status: AC
  Administered 2019-08-02: 650 mg via ORAL
  Filled 2019-08-02: qty 2

## 2019-08-02 MED ORDER — IOHEXOL 300 MG/ML  SOLN
100.0000 mL | Freq: Once | INTRAMUSCULAR | Status: AC | PRN
  Administered 2019-08-02: 15:00:00 100 mL via INTRAVENOUS

## 2019-08-02 NOTE — ED Triage Notes (Addendum)
Low abd pain since this morning. Denies N/V/D. Pt is extremely  Hard of hearing.

## 2019-08-02 NOTE — ED Notes (Signed)
Patient transported to CT 

## 2019-08-02 NOTE — Discharge Instructions (Addendum)
Take Tylenol as needed for pain. Return to the ED if you start to have worsening symptoms, develop a fever, pain with urination, blood in your stool.

## 2019-08-02 NOTE — ED Provider Notes (Signed)
[facility] EMERGENCY DEPARTMENT Provider Note   CSN: [id number] Arrival date & time: 08/02/19  1237     History   Chief Complaint Chief Complaint  Patient presents with   Abdominal Pain    HPI Shannon Wise is a 85 y.o. female who is hard of hearing, history of UTI, migraines, stroke presents to ED for lower abdominal pain since this morning.  States that she woke up with a pressure sensation through her lower abdomen.  Reports associated dysuria.  States that this feels similar to her prior UTIs.  Had a normal bowel movement today.  Denies any nausea, vomiting, hematuria, hematochezia or melena, fever, injuries or falls.  States that she resides at home with her daughter and is ambulatory without assistance.     HPI  Past Medical History:  Diagnosis Date   Anxiety    HOH (hard of hearing)    Migraine    Stroke [facility](HCC)    UTI (urinary tract infection)     Patient Active Problem List   Diagnosis Date Noted   History of kidney stones 07/11/2018   Sleep apnea 11/03/2016   Anxiety 11/03/2016   TMJ (temporomandibular joint disorder) 10/11/2016   PVC (premature ventricular contraction) 03/16/2016   Palpitations 03/16/2016   Sensorineural hearing loss (SNHL), bilateral 01/04/2016   Facet arthropathy 12/29/2014   DDD (degenerative disc disease), lumbar 12/29/2014   Cervical spine fracture (HCC) 03/25/2014    Past Surgical History:  Procedure Laterality Date   ABDOMINAL HYSTERECTOMY     BLADDER SURGERY       OB History   No obstetric history on file.      Home Medications    Prior to Admission medications   Medication Sig Start Date End Date Taking? Authorizing Provider  fluticasone (FLONASE) 50 MCG/ACT nasal spray 1 spray by Each Nare route daily for 10 days. 11/07/17 06/03/19  [provider]  loratadine (CLARITIN) 10 MG tablet Take by mouth. 11/07/17 06/14/19  [provider]    Family History No family history  on file.  Social History Social History   Tobacco Use   Smoking status: Never Smoker   Smokeless tobacco: Never Used  Substance Use Topics   Alcohol use: No    Frequency: Never   Drug use: No     Allergies   Codeine, Levaquin [levofloxacin], and Red dye   Review of Systems Review of Systems  Constitutional: Negative for appetite change, chills and fever.  HENT: Negative for ear pain, rhinorrhea, sneezing and sore throat.   Eyes: Negative for photophobia and visual disturbance.  Respiratory: Negative for cough, chest tightness, shortness of breath and wheezing.   Cardiovascular: Negative for chest pain and palpitations.  Gastrointestinal: Positive for abdominal pain. Negative for blood in stool, constipation, diarrhea, nausea and vomiting.  Genitourinary: Positive for dysuria. Negative for hematuria and urgency.  Musculoskeletal: Negative for myalgias.  Skin: Negative for rash.  Neurological: Negative for dizziness, weakness and light-headedness.     Physical Exam Updated Vital Signs BP 127/74 (BP Location: Right Arm)    Pulse 73    Temp 98.4 F (36.9 C) (Oral)    Resp 16    SpO2 100%   Physical Exam Vitals signs and nursing note reviewed.  Constitutional:      General: She is not in acute distress.    Appearance: She is well-developed.  HENT:     Head: Normocephalic and atraumatic.     Nose: Nose normal.  Eyes:  General: No scleral icterus.       Left eye: No discharge.     Conjunctiva/sclera: Conjunctivae normal.  Neck:     Musculoskeletal: Normal range of motion and neck supple.  Cardiovascular:     Rate and Rhythm: Normal rate and regular rhythm.     Heart sounds: Normal heart sounds. No murmur. No friction rub. No gallop.   Pulmonary:     Effort: Pulmonary effort is normal. No respiratory distress.     Breath sounds: Normal breath sounds.  Abdominal:     General: Bowel sounds are normal. There is no distension.     Palpations: Abdomen is soft.       Tenderness: There is abdominal tenderness in the suprapubic area. There is no guarding.  Musculoskeletal: Normal range of motion.  Skin:    General: Skin is warm and dry.     Findings: No rash.  Neurological:     General: No focal deficit present.     Mental Status: She is alert and oriented to person, place, and time.     Motor: No weakness or abnormal muscle tone.     Coordination: Coordination normal.      ED Treatments / Results  Labs (all labs ordered are listed, but only abnormal results are displayed) Labs Reviewed  COMPREHENSIVE METABOLIC PANEL - Abnormal; Notable for the following components:      Result Value   Glucose, Bld 102 (*)    All other components within normal limits  CBC WITH DIFFERENTIAL/PLATELET - Abnormal; Notable for the following components:   HCT 46.5 (*)    All other components within normal limits  URINE CULTURE  URINALYSIS, ROUTINE W REFLEX MICROSCOPIC    EKG None  Radiology Ct Abdomen Pelvis W Contrast  Result Date: 08/02/2019 CLINICAL DATA:  Abdominal pain. EXAM: CT ABDOMEN AND PELVIS WITH CONTRAST TECHNIQUE: Multidetector CT imaging of the abdomen and pelvis was performed using the standard protocol following bolus administration of intravenous contrast. CONTRAST:  131mL OMNIPAQUE IOHEXOL 300 MG/ML  SOLN COMPARISON:  06/20/2019 FINDINGS: Lower chest: No acute abnormality. Hepatobiliary: No focal liver abnormality is seen. No gallstones, gallbladder wall thickening, or biliary dilatation. Pancreas: Unremarkable. No pancreatic ductal dilatation or surrounding inflammatory changes. Spleen: Normal in size without focal abnormality. Adrenals/Urinary Tract: Adrenal glands are unremarkable. Kidneys are normal, without renal calculi, focal mass, or hydronephrosis. Stable small right renal cysts. Bladder is unremarkable. Stomach/Bowel: Bowel shows no evidence of obstruction, ileus, lesion or inflammation. No free air identified. Vascular/Lymphatic: No  significant vascular findings are present. No enlarged abdominal or pelvic lymph nodes. Reproductive: Status post hysterectomy. No adnexal masses. Other: No abdominal wall hernia or abnormality. No abdominopelvic ascites. Musculoskeletal: Stable degenerative disc disease of the lumbar spine with associated scoliosis. IMPRESSION: No acute findings in the abdomen or pelvis. Electronically Signed   By: Aletta Edouard M.D.   On: 08/02/2019 15:11    Procedures Procedures (including critical care time)  Medications Ordered in ED Medications  acetaminophen (TYLENOL) tablet 650 mg (650 mg Oral Given 08/02/19 1419)  iohexol (OMNIPAQUE) 300 MG/ML solution 100 mL (100 mLs Intravenous Contrast Given 08/02/19 1451)     Initial Impression / Assessment and Plan / ED Course  I have reviewed the triage vital signs and the nursing notes.  Pertinent labs & imaging results that were available during my care of the patient were reviewed by me and considered in my medical decision making (see chart for details).  Shannon LenisFlorence L Klinge was evaluated in Emergency Department on 08/02/19  for the symptoms described in the history of present illness. He/she was evaluated in the context of the global COVID-19 pandemic, which necessitated consideration that the patient might be at risk for infection with the SARS-CoV-2 virus that causes COVID-19. Institutional protocols and algorithms that pertain to the evaluation of patients at risk for COVID-19 are in a state of rapid change based on information released by regulatory bodies including the CDC and federal and state organizations. These policies and algorithms were followed during the patient's care in the ED.  85 year old female presents to ED for lower abdominal pain and dysuria.  Denies any changes to bowel movements, fever, back pain, vomiting.  On exam she is overall well-appearing.  Abdomen is tender in the suprapubic area without rebound or guarding.  Her vital  signs within normal limits.  Work-up including CMP, CBC, urinalysis unremarkable.  CT of the abdomen pelvis with no acute findings.  Patient was given Tylenol with improvement in her pain.  Unclear of exact cause of her pain but she is resting comfortably now so I doubt any emergent cause such as appendicitis, colitis or obstruction.  Will have her continue Tylenol as needed and return for worsening symptoms. Patient discussed with and seen by my attending, Dr. Rhunette CroftNanavati.  Patient is hemodynamically stable, in NAD, and able to ambulate in the ED. Evaluation does not show pathology that would require ongoing emergent intervention or inpatient treatment. I explained the diagnosis to the patient. Pain has been managed and has no complaints prior to discharge. Patient is comfortable with above plan and is stable for discharge at this time. All questions were answered prior to disposition. Strict return precautions for returning to the ED were discussed. Encouraged follow up with PCP.   An After Visit Summary was printed and given to the patient.   Portions of this note were generated with Scientist, clinical (histocompatibility and immunogenetics)Dragon dictation software. Dictation errors may occur despite best attempts at proofreading.   Final Clinical Impressions(s) / ED Diagnoses   Final diagnoses:  Abdominal wall pain    ED Discharge Orders    None       Dietrich PatesKhatri, Demonica Farrey, PA-C 08/02/19 1536    Derwood KaplanNanavati, Ankit, MD 08/03/19 1517

## 2019-08-04 LAB — URINE CULTURE: Culture: >=100000 — AB

## 2019-08-05 ENCOUNTER — Telehealth: Payer: Self-pay | Admitting: Emergency Medicine

## 2019-08-05 NOTE — Telephone Encounter (Signed)
Post ED Visit - Positive Culture Follow-up: Successful Patient Follow-Up  Culture assessed and recommendations reviewed by:  []  Elenor Quinones, Pharm.D. []  Heide Guile, Pharm.D., BCPS AQ-ID []  Parks Neptune, Pharm.D., BCPS []  Alycia Rossetti, Pharm.D., BCPS []  [place], Pharm.D., BCPS, AAHIVP []  Legrand Como, Pharm.D., BCPS, AAHIVP []  Salome Arnt, PharmD, BCPS []  Johnnette Gourd, PharmD, BCPS []  Hughes Better, PharmD, BCPS []  Leeroy Cha, PharmD Nicoletta Dress PharmD  Positive urine culture  [x]  Patient discharged without antimicrobial prescription and treatment is now indicated []  Organism is resistant to prescribed ED discharge antimicrobial []  Patient with positive blood cultures  Changes discussed with ED provider: Martinique Robinson PA New antibiotic prescription symptom if + start amoxicillin 500mg  three times daily x 5 days  Attempting to contact patient     Hazle Nordmann 08/05/2019, 11:46 AM

## 2019-08-08 ENCOUNTER — Encounter (HOSPITAL_BASED_OUTPATIENT_CLINIC_OR_DEPARTMENT_OTHER): Payer: Self-pay | Admitting: Emergency Medicine

## 2019-08-08 ENCOUNTER — Emergency Department (HOSPITAL_BASED_OUTPATIENT_CLINIC_OR_DEPARTMENT_OTHER)
Admission: EM | Admit: 2019-08-08 | Discharge: 2019-08-08 | Disposition: A | Discharge status: Home / Self Care | Payer: Medicare HMO | Attending: Emergency Medicine | Admitting: Emergency Medicine

## 2019-08-08 ENCOUNTER — Other Ambulatory Visit: Payer: Self-pay

## 2019-08-08 DIAGNOSIS — R1030 Lower abdominal pain, unspecified: Secondary | ICD-10-CM | POA: Diagnosis present

## 2019-08-08 DIAGNOSIS — N3000 Acute cystitis without hematuria: Secondary | ICD-10-CM | POA: Diagnosis not present

## 2019-08-08 DIAGNOSIS — Z8673 Personal history of transient ischemic attack (TIA), and cerebral infarction without residual deficits: Secondary | ICD-10-CM | POA: Insufficient documentation

## 2019-08-08 MED ORDER — AMOXICILLIN 500 MG PO TABS: 500.0000 mg | ORAL_TABLET | Freq: Three times a day (TID) | ORAL | 0 refills | Status: DC

## 2019-08-08 NOTE — Discharge Instructions (Signed)
Your urine culture from 6 days ago came back positive for bacteria and we treated you with amoxicillin because it look like that would cover the infection.  However you did give a new urine culture today so if for some reason there is a new bacteria present someone will call and change her antibiotic.

## 2019-08-08 NOTE — ED Provider Notes (Signed)
[facility] EMERGENCY DEPARTMENT Provider Note   CSN: [id number] Arrival date & time: 08/08/19  1405     History   Chief Complaint Chief Complaint  Patient presents with  . Abdominal Pain    HPI Shannon Wise is a 85 y.o. female.     Patient is an 85 year old female with a history of stroke, recurrent UTI, migraines and anxiety who is presenting today with persistent suprapubic discomfort and dysuria.  Patient states the symptoms have not gone away and worsened over the last 2 days.  She is also intermittently having pain in her back but states it is not constant.  She was seen 6 days ago for similar symptoms and at that time had a normal CT, blood work and UA.  However urine culture has grown back enterococcus sensitive to ampicillin.  Patient denies any diarrhea, vomiting and has been able to eat and drink normally.  She denies any difficulty getting around but states she would just like this pain to go away.  The history is provided by the patient.  Abdominal Pain Pain location:  Suprapubic Pain quality: aching and cramping   Pain radiates to:  Does not radiate Pain severity:  Moderate Onset quality:  Gradual Duration:  1 week Timing:  Constant Progression:  Worsening Chronicity:  Recurrent Relieved by:  None tried Worsened by:  Urination Ineffective treatments:  None tried Associated symptoms: dysuria   Associated symptoms: no anorexia, no cough, no diarrhea, no fatigue, no fever, no hematuria, no nausea and no vomiting   Risk factors: being elderly     Past Medical History:  Diagnosis Date  . Anxiety   . HOH (hard of hearing)   . Migraine   . Stroke (HCC)   . UTI (urinary tract infection)     Patient Active Problem List   Diagnosis Date Noted  . History of kidney stones 07/11/2018  . Sleep apnea 11/03/2016  . Anxiety 11/03/2016  . TMJ (temporomandibular joint disorder) 10/11/2016  . PVC (premature ventricular contraction) 03/16/2016  .  Palpitations 03/16/2016  . Sensorineural hearing loss (SNHL), bilateral 01/04/2016  . Facet arthropathy 12/29/2014  . DDD (degenerative disc disease), lumbar 12/29/2014  . Cervical spine fracture (HCC) 03/25/2014    Past Surgical History:  Procedure Laterality Date  . ABDOMINAL HYSTERECTOMY    . BLADDER SURGERY       OB History   No obstetric history on file.      Home Medications    Prior to Admission medications   Medication Sig Start Date End Date Taking? Authorizing Provider  fluticasone (FLONASE) 50 MCG/ACT nasal spray 1 spray by Each Nare route daily for 10 days. 11/07/17 06/03/19  [provider]  loratadine (CLARITIN) 10 MG tablet Take by mouth. 11/07/17 06/14/19  [provider]    Family History No family history on file.  Social History Social History   Tobacco Use  . Smoking status: Never Smoker  . Smokeless tobacco: Never Used  Substance Use Topics  . Alcohol use: No    Frequency: Never  . Drug use: No     Allergies   Codeine, Levaquin [levofloxacin], and Red dye   Review of Systems Review of Systems  Constitutional: Negative for fatigue and fever.  Respiratory: Negative for cough.   Gastrointestinal: Positive for abdominal pain. Negative for anorexia, diarrhea, nausea and vomiting.  Genitourinary: Positive for dysuria. Negative for hematuria.  All other systems reviewed and are negative.    Physical Exam Updated Vital  Signs BP 125/80 (BP Location: Left Arm)   Pulse 76   Temp 98.1 F (36.7 C) (Oral)   Resp 18   SpO2 100%   Physical Exam Vitals signs and nursing note reviewed.  Constitutional:      General: She is not in acute distress.    Appearance: She is well-developed and normal weight.  HENT:     Head: Normocephalic and atraumatic.  Eyes:     Pupils: Pupils are equal, round, and reactive to light.  Cardiovascular:     Rate and Rhythm: Normal rate and regular rhythm.     Heart sounds: Normal heart sounds. No  murmur. No friction rub.  Pulmonary:     Effort: Pulmonary effort is normal.     Breath sounds: Normal breath sounds. No wheezing or rales.  Abdominal:     General: Bowel sounds are normal. There is no distension.     Palpations: Abdomen is soft.     Tenderness: There is abdominal tenderness in the suprapubic area. There is no right CVA tenderness, left CVA tenderness, guarding or rebound.     Hernia: No hernia is present.  Musculoskeletal: Normal range of motion.        General: No tenderness.     Comments: No edema  Skin:    General: Skin is warm and dry.     Findings: No rash.  Neurological:     General: No focal deficit present.     Mental Status: She is alert and oriented to person, place, and time.     Cranial Nerves: No cranial nerve deficit.  Psychiatric:        Mood and Affect: Mood normal.        Behavior: Behavior normal.      ED Treatments / Results  Labs (all labs ordered are listed, but only abnormal results are displayed) Labs Reviewed  URINE CULTURE    EKG None  Radiology No results found.  Procedures Procedures (including critical care time)  Medications Ordered in ED Medications - No data to display   Initial Impression / Assessment and Plan / ED Course  I have reviewed the triage vital signs and the nursing notes.  Pertinent labs & imaging results that were available during my care of the patient were reviewed by me and considered in my medical decision making (see chart for details).       Elderly female returning today with persistent suprapubic discomfort and dysuria.  Patient had a full work-up with CT 6 days ago for the same complaint.  She states is just not getting better and she is not sure why they did not give her anything when she was discharged during her last visit.  They did do a urine culture which has grown back enterococcus which is pansensitive.  Patient has no symptoms concerning for pyelonephritis at this time as she has had  no vomiting, fever but does complain of intermittent back pain.  She is well-appearing on exam is able to ambulate without difficulty and has normal vital signs.  Patient's labs 6 days ago were within normal limits.  Do not feel that patient needs any further work-up at this time.  However repeat urine culture was sent and patient was started on amoxicillin.  Patient was encouraged to return if her symptoms worsen and also encouraged to follow-up with her doctor in the next week for reevaluation.  Final Clinical Impressions(s) / ED Diagnoses   Final diagnoses:  Acute cystitis without hematuria  ED Discharge Orders         Ordered    amoxicillin (AMOXIL) 500 MG tablet  3 times daily     08/08/19 1713           Gwyneth Sprout, MD 08/08/19 1713

## 2019-08-08 NOTE — ED Triage Notes (Addendum)
Low abd pain. Denies N/V/D. Pt is extremely hard of hearing. Pt seen multiple times for same.

## 2019-08-11 LAB — URINE CULTURE: Culture: 80000 — AB

## 2019-08-12 ENCOUNTER — Telehealth: Payer: Self-pay | Admitting: Emergency Medicine

## 2019-08-12 NOTE — Telephone Encounter (Signed)
Post ED Visit - Positive Culture Follow-up  Culture report reviewed by antimicrobial stewardship pharmacist: [place] Team []  Elenor Quinones, Pharm.D. []  Heide Guile, Pharm.D., BCPS AQ-ID []  Parks Neptune, Pharm.D., BCPS []  Alycia Rossetti, Pharm.D., BCPS []  Christie, Pharm.D., BCPS, AAHIVP []  Legrand Como, Pharm.D., BCPS, AAHIVP []  Salome Arnt, PharmD, BCPS []  Johnnette Gourd, PharmD, BCPS []  Hughes Better, PharmD, BCPS []  Leeroy Cha, PharmD []  Laqueta Linden, PharmD, BCPS []  Albertina Parr, PharmD  [place] Team []  Leodis Sias, PharmD []  Lindell Spar, PharmD []  Royetta Asal, PharmD []  Graylin Shiver, Rph []  Rema Fendt) Glennon Mac, PharmD []  Arlyn Dunning, PharmD []  Netta Cedars, PharmD []  Dia Sitter, PharmD []  Leone Haven, PharmD []  Gretta Arab, PharmD []  Theodis Shove, PharmD []  Peggyann Juba, PharmD []  Reuel Boom, PharmD   Positive urine culture Treated with amoxicillin, organism sensitive to the same and no further patient follow-up is required at this time.  Hazle Nordmann 08/12/2019, 11:21 AM

## 2019-08-29 ENCOUNTER — Encounter (HOSPITAL_BASED_OUTPATIENT_CLINIC_OR_DEPARTMENT_OTHER): Payer: Self-pay | Admitting: Emergency Medicine

## 2019-08-29 ENCOUNTER — Emergency Department (HOSPITAL_BASED_OUTPATIENT_CLINIC_OR_DEPARTMENT_OTHER): Payer: Medicare HMO

## 2019-08-29 ENCOUNTER — Emergency Department (HOSPITAL_BASED_OUTPATIENT_CLINIC_OR_DEPARTMENT_OTHER)
Admission: EM | Admit: 2019-08-29 | Discharge: 2019-08-29 | Disposition: A | Discharge status: Home / Self Care | Payer: Medicare HMO | Attending: Emergency Medicine | Admitting: Emergency Medicine

## 2019-08-29 ENCOUNTER — Other Ambulatory Visit: Payer: Self-pay

## 2019-08-29 DIAGNOSIS — Z881 Allergy status to other antibiotic agents status: Secondary | ICD-10-CM | POA: Diagnosis not present

## 2019-08-29 DIAGNOSIS — Z8673 Personal history of transient ischemic attack (TIA), and cerebral infarction without residual deficits: Secondary | ICD-10-CM | POA: Insufficient documentation

## 2019-08-29 DIAGNOSIS — Z885 Allergy status to narcotic agent status: Secondary | ICD-10-CM | POA: Diagnosis not present

## 2019-08-29 DIAGNOSIS — Z79899 Other long term (current) drug therapy: Secondary | ICD-10-CM | POA: Diagnosis not present

## 2019-08-29 DIAGNOSIS — G44209 Tension-type headache, unspecified, not intractable: Secondary | ICD-10-CM | POA: Diagnosis not present

## 2019-08-29 DIAGNOSIS — R51 Headache: Secondary | ICD-10-CM | POA: Diagnosis present

## 2019-08-29 LAB — CBC WITH DIFFERENTIAL/PLATELET
Abs Immature Granulocytes: 0.02 10*3/uL (ref 0.00–0.07)
Basophils Absolute: 0.1 10*3/uL (ref 0.0–0.1)
Basophils Relative: 1 %
Eosinophils Absolute: 0.1 10*3/uL (ref 0.0–0.5)
Eosinophils Relative: 1 %
HCT: 44.3 % (ref 36.0–46.0)
Hemoglobin: 14.1 g/dL (ref 12.0–15.0)
Immature Granulocytes: 0 %
Lymphocytes Relative: 25 %
Lymphs Abs: 1.8 10*3/uL (ref 0.7–4.0)
MCH: 29.6 pg (ref 26.0–34.0)
MCHC: 31.8 g/dL (ref 30.0–36.0)
MCV: 93.1 fL (ref 80.0–100.0)
Monocytes Absolute: 0.6 10*3/uL (ref 0.1–1.0)
Monocytes Relative: 8 %
Neutro Abs: 4.7 10*3/uL (ref 1.7–7.7)
Neutrophils Relative %: 65 %
Platelets: 374 10*3/uL (ref 150–400)
RBC: 4.76 MIL/uL (ref 3.87–5.11)
RDW: 13.1 % (ref 11.5–15.5)
WBC: 7.2 10*3/uL (ref 4.0–10.5)
nRBC: 0 % (ref 0.0–0.2)

## 2019-08-29 LAB — BASIC METABOLIC PANEL
Anion gap: 11 (ref 5–15)
BUN: 6 mg/dL — ABNORMAL LOW (ref 8–23)
CO2: 24 mmol/L (ref 22–32)
Calcium: 9 mg/dL (ref 8.9–10.3)
Chloride: 102 mmol/L (ref 98–111)
Creatinine, Ser: 0.6 mg/dL (ref 0.44–1.00)
GFR calc Af Amer: >60 mL/min (ref >60)
GFR calc non Af Amer: >60 mL/min (ref >60)
Glucose, Bld: 114 mg/dL — ABNORMAL HIGH (ref 70–99)
Potassium: 3.5 mmol/L (ref 3.5–5.1)
Sodium: 137 mmol/L (ref 135–145)

## 2019-08-29 LAB — TROPONIN I (HIGH SENSITIVITY): Troponin I (High Sensitivity): 5 ng/L (ref <18)

## 2019-08-29 LAB — SEDIMENTATION RATE: Sed Rate: 4 mm/hr (ref 0–22)

## 2019-08-29 MED ORDER — DIPHENHYDRAMINE HCL 50 MG/ML IJ SOLN
12.5000 mg | Freq: Once | INTRAMUSCULAR | Status: AC
  Administered 2019-08-29: 12.5 mg via INTRAVENOUS
  Filled 2019-08-29: qty 1

## 2019-08-29 MED ORDER — PROCHLORPERAZINE EDISYLATE 10 MG/2ML IJ SOLN
5.0000 mg | Freq: Once | INTRAMUSCULAR | Status: AC
  Administered 2019-08-29: 12:00:00 5 mg via INTRAVENOUS
  Filled 2019-08-29: qty 2

## 2019-08-29 MED ORDER — ACETAMINOPHEN 500 MG PO TABS
500.0000 mg | ORAL_TABLET | Freq: Once | ORAL | Status: AC
  Administered 2019-08-29: 12:00:00 500 mg via ORAL
  Filled 2019-08-29: qty 1

## 2019-08-29 NOTE — ED Provider Notes (Signed)
[facility] EMERGENCY DEPARTMENT Provider Note   CSN: [id number] Arrival date & time: 08/29/19  1051     History   Chief Complaint Chief Complaint  Patient presents with  . Headache    HPI Shannon Wise is a 85 y.o. female.  Presents emerge department with chief complaint headache.  Headache for the last 2 days, worse on her left side.  Dull, ache, moderate in severity.  Had been taking Tylenol with intermittent relief.  No fever, neck pain, neck stiffness.  No chest pain or difficulty breathing associated with this.  States she has had headaches and migraines previously, none recently though.     HPI  Past Medical History:  Diagnosis Date  . Anxiety   . HOH (hard of hearing)   . Migraine   . Stroke ([place])   . UTI (urinary tract infection)     Patient Active Problem List   Diagnosis Date Noted  . History of kidney stones 07/11/2018  . Sleep apnea 11/03/2016  . Anxiety 11/03/2016  . TMJ (temporomandibular joint disorder) 10/11/2016  . PVC (premature ventricular contraction) 03/16/2016  . Palpitations 03/16/2016  . Sensorineural hearing loss (SNHL), bilateral 01/04/2016  . Facet arthropathy 12/29/2014  . DDD (degenerative disc disease), lumbar 12/29/2014  . Cervical spine fracture ([place]) 03/25/2014    Past Surgical History:  Procedure Laterality Date  . ABDOMINAL HYSTERECTOMY    . BLADDER SURGERY       OB History   No obstetric history on file.      Home Medications    Prior to Admission medications   Medication Sig Start Date End Date Taking? Authorizing Provider  amoxicillin (AMOXIL) 500 MG tablet Take 1 tablet (500 mg total) by mouth 3 (three) times daily. 08/08/19   Blanchie Dessert, MD  fluticasone (FLONASE) 50 MCG/ACT nasal spray 1 spray by Each Nare route daily for 10 days. 11/07/17 06/03/19  [provider]  loratadine (CLARITIN) 10 MG tablet Take by mouth. 11/07/17 06/14/19  [provider]    Family History History  reviewed. No pertinent family history.  Social History Social History   Tobacco Use  . Smoking status: Never Smoker  . Smokeless tobacco: Never Used  Substance Use Topics  . Alcohol use: No    Frequency: Never  . Drug use: No     Allergies   Codeine, Levaquin [levofloxacin], and Red dye   Review of Systems Review of Systems  Constitutional: Negative for chills and fever.  HENT: Negative for ear pain and sore throat.   Eyes: Negative for pain and visual disturbance.  Respiratory: Negative for cough and shortness of breath.   Cardiovascular: Negative for chest pain and palpitations.  Gastrointestinal: Negative for abdominal pain and vomiting.  Genitourinary: Negative for dysuria and hematuria.  Musculoskeletal: Negative for arthralgias and back pain.  Skin: Negative for color change and rash.  Neurological: Positive for headaches. Negative for seizures and syncope.  All other systems reviewed and are negative.    Physical Exam Updated Vital Signs BP (!) 146/87 (BP Location: Right Arm)   Pulse 80   Temp 97.9 F (36.6 C) (Oral)   Resp 18   SpO2 99%   Physical Exam Vitals signs and nursing note reviewed.  Constitutional:      General: She is not in acute distress.    Appearance: She is well-developed.  HENT:     Head: Normocephalic and atraumatic.  Eyes:     Conjunctiva/sclera: Conjunctivae normal.  Neck:  Musculoskeletal: Neck supple.  Cardiovascular:     Rate and Rhythm: Normal rate and regular rhythm.     Heart sounds: No murmur.  Pulmonary:     Effort: Pulmonary effort is normal. No respiratory distress.     Breath sounds: Normal breath sounds.  Abdominal:     Palpations: Abdomen is soft.     Tenderness: There is no abdominal tenderness.  Skin:    General: Skin is warm and dry.  Neurological:     Mental Status: She is alert.     Comments: Alert and oriented x3, 5-5 strength in bilateral upper and lower extremities, sensation to light touch intact  in all 4 extremities, normal finger-nose-finger, normal gait      ED Treatments / Results  Labs (all labs ordered are listed, but only abnormal results are displayed) Labs Reviewed  BASIC METABOLIC PANEL - Abnormal; Notable for the following components:      Result Value   Glucose, Bld 114 (*)    BUN 6 (*)    All other components within normal limits  CBC WITH DIFFERENTIAL/PLATELET  SEDIMENTATION RATE  TROPONIN I (HIGH SENSITIVITY)    EKG None  Radiology Ct Head Wo Contrast  Result Date: 08/29/2019 CLINICAL DATA:  Headache, evaluate for stroke, SAH EXAM: CT HEAD WITHOUT CONTRAST TECHNIQUE: Contiguous axial images were obtained from the base of the skull through the vertex without intravenous contrast. COMPARISON:  11/03/2018 FINDINGS: Brain: No evidence of acute infarction, hemorrhage, hydrocephalus, extra-axial collection or mass lesion/mass effect. Periventricular white matter hypodensity. Unchanged lacunar infarction of the left basal ganglia. Vascular: No hyperdense vessel or unexpected calcification. Skull: Normal. Negative for fracture or focal lesion. Sinuses/Orbits: No acute finding. Other: Metallic debris about left aspect of C1 and the oropharynx. IMPRESSION: No acute intracranial pathology. Small-vessel white matter disease. Unchanged lacunar infarction of the left basal ganglia. Electronically Signed   By: Eddie Candle M.D.   On: 08/29/2019 12:45    Procedures Procedures (including critical care time)  Medications Ordered in ED Medications  prochlorperazine (COMPAZINE) injection 5 mg (5 mg Intravenous Given 08/29/19 1220)  diphenhydrAMINE (BENADRYL) injection 12.5 mg (12.5 mg Intravenous Given 08/29/19 1220)  acetaminophen (TYLENOL) tablet 500 mg (500 mg Oral Given 08/29/19 1220)     Initial Impression / Assessment and Plan / ED Course  I have reviewed the triage vital signs and the nursing notes.  Pertinent labs & imaging results that were available during my care  of the patient were reviewed by me and considered in my medical decision making (see chart for details).  Clinical Course as of Aug 28 1548  Sat Aug 29, 2019  1400 Symptoms improved, will dc home   [RD]    Clinical Course User Index [RD] Lucrezia Starch, MD       61 lady who presents to the ER with headache.  No accompanying symptoms, well-appearing on exam with no neurologic deficits.  No neck pain, neck stiffness, fevers.  CT head negative, labs within normal limits, ESR within normal limits.  Suspect tension type headache versus migraine.  Symptoms resolved after single headache cocktail.  Believe appropriate for outpatient management, reviewed return precautions with patient, will discharge home.    After the discussed management above, the patient was determined to be safe for discharge.  The patient was in agreement with this plan and all questions regarding their care were answered.  ED return precautions were discussed and the patient will return to the ED with any significant worsening  of condition.    Final Clinical Impressions(s) / ED Diagnoses   Final diagnoses:  Tension headache    ED Discharge Orders    None       Lucrezia Starch, MD 08/29/19 [phone]

## 2019-08-29 NOTE — ED Triage Notes (Signed)
Pt here with headache behind the eyes x 2 days. Denies other symptoms.

## 2019-08-29 NOTE — Discharge Instructions (Signed)
Recommend taking Tylenol for your headaches.  If you develop numbness, weakness, difficulty walking, difficulty speaking, vision changes or other new concerning symptom recommend return to ER for reassessment.  Otherwise recommend following up with your primary doctor for recheck next week.

## 2019-09-02 ENCOUNTER — Emergency Department (HOSPITAL_BASED_OUTPATIENT_CLINIC_OR_DEPARTMENT_OTHER)
Admission: EM | Admit: 2019-09-02 | Discharge: 2019-09-02 | Disposition: A | Discharge status: Home / Self Care | Payer: Medicare HMO | Attending: Emergency Medicine | Admitting: Emergency Medicine

## 2019-09-02 ENCOUNTER — Encounter (HOSPITAL_BASED_OUTPATIENT_CLINIC_OR_DEPARTMENT_OTHER): Payer: Self-pay | Admitting: Emergency Medicine

## 2019-09-02 DIAGNOSIS — R300 Dysuria: Secondary | ICD-10-CM | POA: Diagnosis present

## 2019-09-02 DIAGNOSIS — N390 Urinary tract infection, site not specified: Secondary | ICD-10-CM

## 2019-09-02 LAB — URINALYSIS, ROUTINE W REFLEX MICROSCOPIC
Bilirubin Urine: NEGATIVE
Glucose, UA: NEGATIVE mg/dL
Ketones, ur: NEGATIVE mg/dL
Nitrite: NEGATIVE
Protein, ur: NEGATIVE mg/dL
Specific Gravity, Urine: <1.005 — ABNORMAL LOW (ref 1.005–1.030)
pH: 7 (ref 5.0–8.0)

## 2019-09-02 LAB — URINALYSIS, MICROSCOPIC (REFLEX): WBC, UA: >50 WBC/hpf (ref 0–5)

## 2019-09-02 MED ORDER — NITROFURANTOIN MONOHYD MACRO 100 MG PO CAPS
100.0000 mg | ORAL_CAPSULE | Freq: Once | ORAL | Status: AC
  Administered 2019-09-02: 22:00:00 100 mg via ORAL
  Filled 2019-09-02: qty 1

## 2019-09-02 MED ORDER — PHENAZOPYRIDINE HCL 200 MG PO TABS: 200.0000 mg | ORAL_TABLET | Freq: Three times a day (TID) | ORAL | 0 refills | Status: DC | PRN

## 2019-09-02 MED ORDER — NITROFURANTOIN MONOHYD MACRO 100 MG PO CAPS: 100.0000 mg | ORAL_CAPSULE | Freq: Two times a day (BID) | ORAL | 0 refills | Status: DC

## 2019-09-02 NOTE — ED Triage Notes (Signed)
Pt states she is having burning with urination and it hurts when she sits down  Pt states sxs started today around 3pm

## 2019-09-02 NOTE — ED Provider Notes (Signed)
[facility] EMERGENCY DEPARTMENT Provider Note   CSN: [id number] Arrival date & time: 09/02/19  2043     History   Chief Complaint Chief Complaint  Patient presents with  . Dysuria    HPI Shannon Wise is a 85 y.o. female.     HPI Patient with frequent UTIs.  States she did developed burning urination around 3 PM today.  No fever or chills.  No flank pain.  No nausea or vomiting. Past Medical History:  Diagnosis Date  . Anxiety   . HOH (hard of hearing)   . Migraine   . Stroke (HCC)   . UTI (urinary tract infection)     Patient Active Problem List   Diagnosis Date Noted  . History of kidney stones 07/11/2018  . Sleep apnea 11/03/2016  . Anxiety 11/03/2016  . TMJ (temporomandibular joint disorder) 10/11/2016  . PVC (premature ventricular contraction) 03/16/2016  . Palpitations 03/16/2016  . Sensorineural hearing loss (SNHL), bilateral 01/04/2016  . Facet arthropathy 12/29/2014  . DDD (degenerative disc disease), lumbar 12/29/2014  . Cervical spine fracture (HCC) 03/25/2014    Past Surgical History:  Procedure Laterality Date  . ABDOMINAL HYSTERECTOMY    . BLADDER SURGERY       OB History   No obstetric history on file.      Home Medications    Prior to Admission medications   Medication Sig Start Date End Date Taking? Authorizing Provider  amoxicillin (AMOXIL) 500 MG tablet Take 1 tablet (500 mg total) by mouth 3 (three) times daily. 08/08/19   Gwyneth Sprout, MD  nitrofurantoin, macrocrystal-monohydrate, (MACROBID) 100 MG capsule Take 1 capsule (100 mg total) by mouth 2 (two) times daily. 09/03/19   Loren Racer, MD  phenazopyridine (PYRIDIUM) 200 MG tablet Take 1 tablet (200 mg total) by mouth 3 (three) times daily as needed for pain. 09/02/19   Loren Racer, MD  fluticasone (FLONASE) 50 MCG/ACT nasal spray 1 spray by Each Nare route daily for 10 days. 11/07/17 06/03/19  [provider]  loratadine (CLARITIN) 10 MG tablet  Take by mouth. 11/07/17 06/14/19  [provider]    Family History History reviewed. No pertinent family history.  Social History Social History   Tobacco Use  . Smoking status: Never Smoker  . Smokeless tobacco: Never Used  Substance Use Topics  . Alcohol use: No    Frequency: Never  . Drug use: No     Allergies   Codeine, Levaquin [levofloxacin], and Red dye   Review of Systems Review of Systems  Constitutional: Negative for chills and fever.  Gastrointestinal: Negative for abdominal pain and nausea.  Genitourinary: Positive for dysuria. Negative for difficulty urinating, flank pain, frequency and hematuria.  Musculoskeletal: Negative for back pain.  Neurological: Negative for weakness and numbness.  All other systems reviewed and are negative.    Physical Exam Updated Vital Signs BP (!) 128/94 (BP Location: Left Arm)   Pulse 87   Temp 97.9 F (36.6 C) (Oral)   Resp 16   Ht 5\' 4"  (1.626 m)   Wt 61.2 kg   SpO2 99%   BMI 23.17 kg/m   Physical Exam Vitals signs and nursing note reviewed.  Constitutional:      General: She is not in acute distress.    Appearance: Normal appearance. She is well-developed. She is not ill-appearing.  HENT:     Head: Normocephalic and atraumatic.  Eyes:     Pupils: Pupils are equal, round, and reactive to  light.  Neck:     Musculoskeletal: Normal range of motion and neck supple.  Cardiovascular:     Rate and Rhythm: Normal rate and regular rhythm.  Pulmonary:     Effort: Pulmonary effort is normal.     Breath sounds: Normal breath sounds.  Abdominal:     General: Bowel sounds are normal. There is no distension.     Palpations: Abdomen is soft. There is no mass.     Tenderness: There is no abdominal tenderness. There is no right CVA tenderness, left CVA tenderness, guarding or rebound.     Hernia: No hernia is present.  Musculoskeletal: Normal range of motion.        General: No tenderness.  Skin:    General:  Skin is warm and dry.     Capillary Refill: Capillary refill takes less than 2 seconds.     Findings: No erythema or rash.  Neurological:     General: No focal deficit present.     Mental Status: She is alert and oriented to person, place, and time.     Comments: Ambulating without difficulty.  Moving all extremities without deficit.  Baseline hard of hearing.  Psychiatric:        Behavior: Behavior normal.      ED Treatments / Results  Labs (all labs ordered are listed, but only abnormal results are displayed) Labs Reviewed  URINALYSIS, ROUTINE W REFLEX MICROSCOPIC - Abnormal; Notable for the following components:      Result Value   APPearance CLOUDY (*)    Specific Gravity, Urine <1.005 (*)    Hgb urine dipstick MODERATE (*)    Leukocytes,Ua LARGE (*)    All other components within normal limits  URINALYSIS, MICROSCOPIC (REFLEX) - Abnormal; Notable for the following components:   Bacteria, UA FEW (*)    All other components within normal limits  URINE CULTURE    EKG None  Radiology No results found.  Procedures Procedures (including critical care time)  Medications Ordered in ED Medications  nitrofurantoin (macrocrystal-monohydrate) (MACROBID) capsule 100 mg (100 mg Oral Given 09/02/19 2150)     Initial Impression / Assessment and Plan / ED Course  I have reviewed the triage vital signs and the nursing notes.  Pertinent labs & imaging results that were available during my care of the patient were reviewed by me and considered in my medical decision making (see chart for details).       Patient with evidence of UTI.  Urine sent for culture.  Appears to have grown out Enterococcus faecalis and E. coli in the past.  Both of been sensitive to Macrobid.  Given first dose in the emergency department.  Return precautions given.  Final Clinical Impressions(s) / ED Diagnoses   Final diagnoses:  Lower urinary tract infectious disease    ED Discharge Orders          Ordered    nitrofurantoin, macrocrystal-monohydrate, (MACROBID) 100 MG capsule  2 times daily     09/02/19 2149    phenazopyridine (PYRIDIUM) 200 MG tablet  3 times daily PRN     09/02/19 2149           Julianne Rice, MD 09/02/19 2203

## 2019-09-05 LAB — URINE CULTURE: Culture: 20000 — AB

## 2019-09-06 ENCOUNTER — Telehealth: Payer: Self-pay | Admitting: Emergency Medicine

## 2019-09-06 NOTE — Telephone Encounter (Signed)
Post ED Visit - Positive Culture Follow-up  Culture report reviewed by antimicrobial stewardship pharmacist: [place] Team []  Elenor Quinones, Pharm.D. []  Heide Guile, Pharm.D., BCPS AQ-ID []  Parks Neptune, Pharm.D., BCPS []  Alycia Rossetti, Pharm.D., BCPS []  [place], Pharm.D., BCPS, AAHIVP []  Legrand Como, Pharm.D., BCPS, AAHIVP []  Salome Arnt, PharmD, BCPS []  Johnnette Gourd, PharmD, BCPS []  Hughes Better, PharmD, BCPS [x]  Cheron Schaumann, PharmD []  Laqueta Linden, PharmD, BCPS []  Albertina Parr, PharmD  [place] Team []  Leodis Sias, PharmD []  Lindell Spar, PharmD []  Royetta Asal, PharmD []  Graylin Shiver, Rph []  Rema Fendt) Glennon Mac, PharmD []  Arlyn Dunning, PharmD []  Netta Cedars, PharmD []  Dia Sitter, PharmD []  Leone Haven, PharmD []  Gretta Arab, PharmD []  Theodis Shove, PharmD []  Peggyann Juba, PharmD []  Reuel Boom, PharmD   Positive urine culture Treated with Nitrofurantoin, organism sensitive to the same and no further patient follow-up is required at this time.  Shannon Wise 09/06/2019, 2:44 PM

## 2020-05-07 ENCOUNTER — Emergency Department (HOSPITAL_BASED_OUTPATIENT_CLINIC_OR_DEPARTMENT_OTHER)
Admission: EM | Admit: 2020-05-07 | Discharge: 2020-05-07 | Disposition: A | Discharge status: Home / Self Care | Payer: Medicare HMO | Attending: Emergency Medicine | Admitting: Emergency Medicine

## 2020-05-07 ENCOUNTER — Emergency Department (HOSPITAL_BASED_OUTPATIENT_CLINIC_OR_DEPARTMENT_OTHER): Payer: Medicare HMO

## 2020-05-07 ENCOUNTER — Other Ambulatory Visit: Payer: Self-pay

## 2020-05-07 ENCOUNTER — Encounter (HOSPITAL_BASED_OUTPATIENT_CLINIC_OR_DEPARTMENT_OTHER): Payer: Self-pay

## 2020-05-07 DIAGNOSIS — Z79899 Other long term (current) drug therapy: Secondary | ICD-10-CM | POA: Diagnosis not present

## 2020-05-07 DIAGNOSIS — M170 Bilateral primary osteoarthritis of knee: Secondary | ICD-10-CM

## 2020-05-07 DIAGNOSIS — M25562 Pain in left knee: Secondary | ICD-10-CM | POA: Insufficient documentation

## 2020-05-07 DIAGNOSIS — Z885 Allergy status to narcotic agent status: Secondary | ICD-10-CM | POA: Diagnosis not present

## 2020-05-07 DIAGNOSIS — Z8673 Personal history of transient ischemic attack (TIA), and cerebral infarction without residual deficits: Secondary | ICD-10-CM | POA: Insufficient documentation

## 2020-05-07 DIAGNOSIS — Z881 Allergy status to other antibiotic agents status: Secondary | ICD-10-CM | POA: Insufficient documentation

## 2020-05-07 DIAGNOSIS — M25561 Pain in right knee: Secondary | ICD-10-CM | POA: Diagnosis present

## 2020-05-07 MED ORDER — TRAMADOL HCL 50 MG PO TABS: 50.0000 mg | ORAL_TABLET | Freq: Four times a day (QID) | ORAL | 0 refills | Status: DC | PRN

## 2020-05-07 MED ORDER — ACETAMINOPHEN 500 MG PO TABS
1000.0000 mg | ORAL_TABLET | Freq: Once | ORAL | Status: AC
  Administered 2020-05-07: 1000 mg via ORAL
  Filled 2020-05-07: qty 2

## 2020-05-07 MED ORDER — ACETAMINOPHEN 500 MG PO TABS: 1000.0000 mg | ORAL_TABLET | Freq: Four times a day (QID) | ORAL | 0 refills | Status: DC | PRN

## 2020-05-07 MED ORDER — TRAMADOL HCL 50 MG PO TABS
50.0000 mg | ORAL_TABLET | Freq: Once | ORAL | Status: AC
  Administered 2020-05-07: 50 mg via ORAL
  Filled 2020-05-07: qty 1

## 2020-05-07 NOTE — ED Triage Notes (Signed)
Pt arrives with c/o bilateral leg pain X3 weeks denies injury, ambulatory at triage.

## 2020-05-07 NOTE — ED Provider Notes (Signed)
[facility] EMERGENCY DEPARTMENT Provider Note   CSN: [id number] Arrival date & time: 05/07/20  1230     History Chief Complaint  Patient presents with  . Leg Pain    Shannon Wise is a 86 y.o. female.  HPI Patient reports months of bilateral knee pain. She reports both knees ache. The ache just around the knees and a little bit above and below. She reports it is worse when she has been standing or walking longer distances. She has not seen any redness or swelling. The pain is constant. She reports she takes Tylenol but does not really get any relief. Patient denies she has been seen by an orthopedic doctor for treatment before.    Past Medical History:  Diagnosis Date  . Anxiety   . HOH (hard of hearing)   . Migraine   . Stroke ([place])   . UTI (urinary tract infection)     Patient Active Problem List   Diagnosis Date Noted  . History of kidney stones 07/11/2018  . Sleep apnea 11/03/2016  . Anxiety 11/03/2016  . TMJ (temporomandibular joint disorder) 10/11/2016  . PVC (premature ventricular contraction) 03/16/2016  . Palpitations 03/16/2016  . Sensorineural hearing loss (SNHL), bilateral 01/04/2016  . Facet arthropathy 12/29/2014  . DDD (degenerative disc disease), lumbar 12/29/2014  . Cervical spine fracture ([place]) 03/25/2014    Past Surgical History:  Procedure Laterality Date  . ABDOMINAL HYSTERECTOMY    . BLADDER SURGERY       OB History   No obstetric history on file.     No family history on file.  Social History   Tobacco Use  . Smoking status: Never Smoker  . Smokeless tobacco: Never Used  Substance Use Topics  . Alcohol use: No  . Drug use: No    Home Medications Prior to Admission medications   Medication Sig Start Date End Date Taking? Authorizing Provider  acetaminophen (TYLENOL) 500 MG tablet Take 2 tablets (1,000 mg total) by mouth every 6 (six) hours as needed. 05/07/20   Charlesetta Shanks, MD  amoxicillin (AMOXIL) 500 MG  tablet Take 1 tablet (500 mg total) by mouth 3 (three) times daily. 08/08/19   Blanchie Dessert, MD  nitrofurantoin, macrocrystal-monohydrate, (MACROBID) 100 MG capsule Take 1 capsule (100 mg total) by mouth 2 (two) times daily. 09/03/19   Julianne Rice, MD  phenazopyridine (PYRIDIUM) 200 MG tablet Take 1 tablet (200 mg total) by mouth 3 (three) times daily as needed for pain. 09/02/19   Julianne Rice, MD  traMADol (ULTRAM) 50 MG tablet Take 1 tablet (50 mg total) by mouth every 6 (six) hours as needed. 05/07/20   Charlesetta Shanks, MD  fluticasone (FLONASE) 50 MCG/ACT nasal spray 1 spray by Each Nare route daily for 10 days. 11/07/17 06/03/19  [provider]  loratadine (CLARITIN) 10 MG tablet Take by mouth. 11/07/17 06/14/19  [provider]    Allergies    Codeine, Levaquin [levofloxacin], and Red dye  Review of Systems   Review of Systems Constitutional: No fever no chills no general malaise Respiratory: No shortness of breath no cough. Musculoskeletal: No swelling of the calves. Physical Exam Updated Vital Signs BP 133/85 (BP Location: Right Arm)   Pulse 82   Temp 98.3 F (36.8 C) (Oral)   Resp 20   Ht 5\' 7"  (1.702 m)   Wt 56.7 kg   SpO2 99%   BMI 19.58 kg/m   Physical Exam Constitutional:      Comments: Alert  nontoxic and well in appearance.  Cardiovascular:     Rate and Rhythm: Normal rate and regular rhythm.  Pulmonary:     Effort: Pulmonary effort is normal.     Breath sounds: Normal breath sounds.  Musculoskeletal:        General: Normal range of motion.     Comments: Patient has significant osteoarthritic changes of both knees. She however has no effusion. No erythema or skin changes present. Calves are soft and supple. No peripheral edema. Feet are in very good condition except for arthritic changes. Salas pedis pulses are 2+ and strong. Feet are warm and dry. Patient actually has good flexion and extension. She can take her own pants off and on.  She is able to flex both knees to put her garments on in a seated position on the stretcher.  Neurological:     General: No focal deficit present.     Mental Status: She is oriented to person, place, and time.     Coordination: Coordination normal.  Psychiatric:        Mood and Affect: Mood normal.     ED Results / Procedures / Treatments   Labs (all labs ordered are listed, but only abnormal results are displayed) Labs Reviewed - No data to display  EKG None  Radiology DG Knee 2 Views Left  Result Date: 05/07/2020 CLINICAL DATA:  Pain EXAM: LEFT KNEE - 1-2 VIEW COMPARISON:  November 16, 2018 FINDINGS: Frontal and lateral views obtained. No evident fracture or dislocation. No appreciable joint effusion. There is mild spurring in all compartments. There is mild narrowing medially. There is slight chondrocalcinosis. No erosive change. Popliteal artery atherosclerosis noted. IMPRESSION: Mild narrowing in the patellofemoral joint. Mild spurring in all compartments. No fracture, dislocation, or joint effusion. There is a degree of chondrocalcinosis, a finding that may be seen with osteoarthritis or with calcium pyrophosphate deposition disease. There are scattered foci of popliteal artery atherosclerotic calcification. Electronically Signed   By: Bretta Bang III M.D.   On: 05/07/2020 14:03   DG Knee 2 Views Right  Result Date: 05/07/2020 CLINICAL DATA:  Knee pain EXAM: RIGHT KNEE - 1-2 VIEW COMPARISON:  None FINDINGS: Narrowing of the lateral compartment with bone on bone approximation. There is associated osteophytosis of the lateral compartment. No joint effusion. No fracture dislocation. IMPRESSION: 1. No acute findings RIGHT knee. 2. Severe joint space narrowing of the lateral compartment with osteoarthritis. Electronically Signed   By: Genevive Bi M.D.   On: 05/07/2020 14:04    Procedures Procedures (including critical care time)  Medications Ordered in ED Medications    traMADol (ULTRAM) tablet 50 mg (50 mg Oral Given 05/07/20 1359)  acetaminophen (TYLENOL) tablet 1,000 mg (1,000 mg Oral Given 05/07/20 1359)    ED Course  I have reviewed the triage vital signs and the nursing notes.  Pertinent labs & imaging results that were available during my care of the patient were reviewed by me and considered in my medical decision making (see chart for details).    MDM Rules/Calculators/A&P                      Patient describes chronic longstanding knee pain in bilateral knees. Her physical exam is consistent with arthritic changes with no acute effusions. Despite significant arthritic changes on x-ray she does have good range of motion in application of dressing and undressing and following commands. This time it appears she needs pain control. She reports she does  not get sufficient pain control from Tylenol alone. Will add tramadol. Also have counseled on the importance of following up with orthopedics for additional options for pain control and management of chronic knee pain due to osteoarthritis. Final Clinical Impression(s) / ED Diagnoses Final diagnoses:  Primary osteoarthritis of both knees    Rx / DC Orders ED Discharge Orders         Ordered    traMADol (ULTRAM) 50 MG tablet  Every 6 hours PRN     05/07/20 1531    acetaminophen (TYLENOL) 500 MG tablet  Every 6 hours PRN     05/07/20 1531           Arby Barrette, MD 05/07/20 1536

## 2020-05-07 NOTE — Discharge Instructions (Addendum)
1. You have severe osteoarthritis of your knees. This is chronic degenerative disease. You will need to see an orthopedic specialist to get additional evaluation and treatment. You have been given the number for [facility] in your discharge instructions. Call on the next business day to schedule your appointment. 2. You may take extra strength Tylenol 3 times a day. You may take 1-2 tramadol tablets with your Tylenol dose for pain control. 3. Return to the emergency department if you get fevers if you get redness or swelling of the knees or other concerning symptoms.

## 2020-05-15 ENCOUNTER — Other Ambulatory Visit: Payer: Self-pay

## 2020-05-15 ENCOUNTER — Emergency Department (HOSPITAL_BASED_OUTPATIENT_CLINIC_OR_DEPARTMENT_OTHER)
Admission: EM | Admit: 2020-05-15 | Discharge: 2020-05-15 | Disposition: A | Discharge status: Home / Self Care | Payer: Medicare HMO | Attending: Emergency Medicine | Admitting: Emergency Medicine

## 2020-05-15 ENCOUNTER — Encounter (HOSPITAL_BASED_OUTPATIENT_CLINIC_OR_DEPARTMENT_OTHER): Payer: Self-pay | Admitting: Emergency Medicine

## 2020-05-15 ENCOUNTER — Emergency Department (HOSPITAL_BASED_OUTPATIENT_CLINIC_OR_DEPARTMENT_OTHER): Payer: Medicare HMO

## 2020-05-15 DIAGNOSIS — R109 Unspecified abdominal pain: Secondary | ICD-10-CM | POA: Insufficient documentation

## 2020-05-15 DIAGNOSIS — Z79899 Other long term (current) drug therapy: Secondary | ICD-10-CM | POA: Diagnosis not present

## 2020-05-15 LAB — CBC WITH DIFFERENTIAL/PLATELET
Abs Immature Granulocytes: 0.02 10*3/uL (ref 0.00–0.07)
Basophils Absolute: 0.1 10*3/uL (ref 0.0–0.1)
Basophils Relative: 1 %
Eosinophils Absolute: 0.1 10*3/uL (ref 0.0–0.5)
Eosinophils Relative: 2 %
HCT: 44.1 % (ref 36.0–46.0)
Hemoglobin: 14.2 g/dL (ref 12.0–15.0)
Immature Granulocytes: 0 %
Lymphocytes Relative: 27 %
Lymphs Abs: 2.2 10*3/uL (ref 0.7–4.0)
MCH: 30.2 pg (ref 26.0–34.0)
MCHC: 32.2 g/dL (ref 30.0–36.0)
MCV: 93.8 fL (ref 80.0–100.0)
Monocytes Absolute: 0.6 10*3/uL (ref 0.1–1.0)
Monocytes Relative: 7 %
Neutro Abs: 5.2 10*3/uL (ref 1.7–7.7)
Neutrophils Relative %: 63 %
Platelets: 347 10*3/uL (ref 150–400)
RBC: 4.7 MIL/uL (ref 3.87–5.11)
RDW: 13.2 % (ref 11.5–15.5)
WBC: 8.2 10*3/uL (ref 4.0–10.5)
nRBC: 0 % (ref 0.0–0.2)

## 2020-05-15 LAB — COMPREHENSIVE METABOLIC PANEL
ALT: 20 U/L (ref 0–44)
AST: 21 U/L (ref 15–41)
Albumin: 4.3 g/dL (ref 3.5–5.0)
Alkaline Phosphatase: 70 U/L (ref 38–126)
Anion gap: 8 (ref 5–15)
BUN: 11 mg/dL (ref 8–23)
CO2: 27 mmol/L (ref 22–32)
Calcium: 9.2 mg/dL (ref 8.9–10.3)
Chloride: 103 mmol/L (ref 98–111)
Creatinine, Ser: 0.75 mg/dL (ref 0.44–1.00)
GFR calc Af Amer: >60 mL/min (ref >60)
GFR calc non Af Amer: >60 mL/min (ref >60)
Glucose, Bld: 98 mg/dL (ref 70–99)
Potassium: 3.8 mmol/L (ref 3.5–5.1)
Sodium: 138 mmol/L (ref 135–145)
Total Bilirubin: 0.4 mg/dL (ref 0.3–1.2)
Total Protein: 7.2 g/dL (ref 6.5–8.1)

## 2020-05-15 LAB — URINALYSIS, ROUTINE W REFLEX MICROSCOPIC
Bilirubin Urine: NEGATIVE
Glucose, UA: NEGATIVE mg/dL
Hgb urine dipstick: NEGATIVE
Ketones, ur: NEGATIVE mg/dL
Leukocytes,Ua: NEGATIVE
Nitrite: NEGATIVE
Protein, ur: NEGATIVE mg/dL
Specific Gravity, Urine: 1.02 (ref 1.005–1.030)
pH: 6.5 (ref 5.0–8.0)

## 2020-05-15 LAB — LIPASE, BLOOD: Lipase: 24 U/L (ref 11–51)

## 2020-05-15 NOTE — ED Notes (Signed)
[facility] ED nursing note: states she no longer has abd pain, removed own IV, stated she is going home now and left without EDP seeing pt to review exam and lab work. EDP aware along with ED Charge Nurse. Left without DC instructions

## 2020-05-15 NOTE — ED Triage Notes (Signed)
Pt very HOH. C/o abd pain this morning. Denies N/V

## 2020-05-15 NOTE — ED Provider Notes (Signed)
[facility] EMERGENCY DEPARTMENT Provider Note   CSN: [id number] Arrival date & time: 05/15/20  1148     History Chief Complaint  Patient presents with  . Abdominal Pain    Shannon Wise is a 86 y.o. female.  HPI Patient presents with abdominal pain.  States she is had for yesterday and today.  In the mid abdomen.  No nausea.  No vomiting.  No dysuria.  States it is dull.  Abdomen may be more swollen than baseline.  No fevers.  States she is not hungry.  Does have a history of being hard of hearing although did appear to be able to participate in the history.    Past Medical History:  Diagnosis Date  . Anxiety   . HOH (hard of hearing)   . Migraine   . Stroke ([place])   . UTI (urinary tract infection)     Patient Active Problem List   Diagnosis Date Noted  . History of kidney stones 07/11/2018  . Sleep apnea 11/03/2016  . Anxiety 11/03/2016  . TMJ (temporomandibular joint disorder) 10/11/2016  . PVC (premature ventricular contraction) 03/16/2016  . Palpitations 03/16/2016  . Sensorineural hearing loss (SNHL), bilateral 01/04/2016  . Facet arthropathy 12/29/2014  . DDD (degenerative disc disease), lumbar 12/29/2014  . Cervical spine fracture ([place]) 03/25/2014    Past Surgical History:  Procedure Laterality Date  . ABDOMINAL HYSTERECTOMY    . BLADDER SURGERY       OB History   No obstetric history on file.     No family history on file.  Social History   Tobacco Use  . Smoking status: Never Smoker  . Smokeless tobacco: Never Used  Substance Use Topics  . Alcohol use: No  . Drug use: No    Home Medications Prior to Admission medications   Medication Sig Start Date End Date Taking? Authorizing Provider  acetaminophen (TYLENOL) 500 MG tablet Take 2 tablets (1,000 mg total) by mouth every 6 (six) hours as needed. 05/07/20   Charlesetta Shanks, MD  amoxicillin (AMOXIL) 500 MG tablet Take 1 tablet (500 mg total) by mouth 3 (three) times daily.  08/08/19   Blanchie Dessert, MD  nitrofurantoin, macrocrystal-monohydrate, (MACROBID) 100 MG capsule Take 1 capsule (100 mg total) by mouth 2 (two) times daily. 09/03/19   Julianne Rice, MD  phenazopyridine (PYRIDIUM) 200 MG tablet Take 1 tablet (200 mg total) by mouth 3 (three) times daily as needed for pain. 09/02/19   Julianne Rice, MD  traMADol (ULTRAM) 50 MG tablet Take 1 tablet (50 mg total) by mouth every 6 (six) hours as needed. 05/07/20   Charlesetta Shanks, MD  fluticasone (FLONASE) 50 MCG/ACT nasal spray 1 spray by Each Nare route daily for 10 days. 11/07/17 06/03/19  [provider]  loratadine (CLARITIN) 10 MG tablet Take by mouth. 11/07/17 06/14/19  [provider]    Allergies    Codeine, Levaquin [levofloxacin], and Red dye  Review of Systems   Review of Systems  Constitutional: Negative for fever.  HENT: Negative for congestion.   Respiratory: Negative for shortness of breath.   Cardiovascular: Negative for chest pain.  Gastrointestinal: Positive for abdominal pain. Negative for constipation, diarrhea, nausea and vomiting.  Genitourinary: Negative for flank pain.  Musculoskeletal: Negative for back pain.  Skin: Negative for rash.  Neurological: Negative for weakness.  Psychiatric/Behavioral: Negative for confusion.    Physical Exam Updated Vital Signs BP (!) 158/81 (BP Location: Left Arm)   Pulse 86  Temp 98.6 F (37 C) (Oral)   Resp 16   SpO2 100%   Physical Exam Vitals and nursing note reviewed.  HENT:     Head: Normocephalic.  Cardiovascular:     Rate and Rhythm: Regular rhythm.  Pulmonary:     Breath sounds: No wheezing, rhonchi or rales.  Abdominal:     Comments: Mild mid abdominal tenderness.  May have some mild distention.  No mass.  No rebound or guarding.-  Skin:    General: Skin is warm.  Neurological:     Mental Status: She is alert and oriented to person, place, and time.     ED Results / Procedures / Treatments    Labs (all labs ordered are listed, but only abnormal results are displayed) Labs Reviewed  COMPREHENSIVE METABOLIC PANEL  LIPASE, BLOOD  CBC WITH DIFFERENTIAL/PLATELET  URINALYSIS, ROUTINE W REFLEX MICROSCOPIC    EKG None  Radiology DG Abd 2 Views  Result Date: 05/15/2020 CLINICAL DATA:  Abdominal pain this morning EXAM: ABDOMEN - 2 VIEW COMPARISON:  06/06/2017 FINDINGS: Streaky atelectasis versus scarring LEFT lung base. Scattered stool throughout colon. Nonobstructive bowel gas pattern. No bowel dilatation, bowel wall thickening, or free air. Thoracolumbar scoliosis and multilevel degenerative disc disease changes. No definite urinary tract calcification. IMPRESSION: Nonobstructive bowel gas pattern. Electronically Signed   By: Ulyses Southward M.D.   On: 05/15/2020 13:05    Procedures Procedures (including critical care time)  Medications Ordered in ED Medications - No data to display  ED Course  I have reviewed the triage vital signs and the nursing notes.  Pertinent labs & imaging results that were available during my care of the patient were reviewed by me and considered in my medical decision making (see chart for details).    MDM Rules/Calculators/A&P                      Patient abdominal pain.  Mid abdominal.  No rebound no guarding.  Lab work reassuring.  Urine does not show infection.  Before reevaluation are told results patient stated that she felt better and eloped from the ER. Final Clinical Impression(s) / ED Diagnoses Final diagnoses:  Abdominal pain  Abdominal pain, unspecified abdominal location    Rx / DC Orders ED Discharge Orders    None       Benjiman Core, MD 05/15/20 1422

## 2020-05-17 ENCOUNTER — Ambulatory Visit: Payer: Self-pay

## 2020-05-17 ENCOUNTER — Encounter: Payer: Self-pay | Admitting: Family Medicine

## 2020-05-17 ENCOUNTER — Other Ambulatory Visit: Payer: Self-pay

## 2020-05-17 ENCOUNTER — Ambulatory Visit: Payer: Medicare HMO | Admitting: Family Medicine

## 2020-05-17 VITALS — BP 136/85 | HR 71 | Ht 68.0 in | Wt 135.0 lb

## 2020-05-17 DIAGNOSIS — M170 Bilateral primary osteoarthritis of knee: Secondary | ICD-10-CM

## 2020-05-17 MED ORDER — TRIAMCINOLONE ACETONIDE 40 MG/ML IJ SUSP
40.0000 mg | Freq: Once | INTRAMUSCULAR | Status: AC
  Administered 2020-05-17: 40 mg via INTRA_ARTICULAR

## 2020-05-17 NOTE — Assessment & Plan Note (Signed)
Acute on chronic in nature.  She has worse degenerative changes of the right knee compared to the left.  No falls or inciting events. -Counseled on home exercise therapy and supportive care. -Bilateral injections. -Could consider physical therapy.

## 2020-05-17 NOTE — Progress Notes (Signed)
Shannon Wise - 86 y.o. female MRN [id number]  Date of birth: 06/20/1934  SUBJECTIVE:  Including CC & ROS.  Chief Complaint  Patient presents with  . Knee Pain    bilateral    Shannon Wise is a 86 y.o. female that is presenting with acute on chronic bilateral knee pain.  The right knee is worse than the left knee.  She denies any injury or inciting event.  She has had steroid injections in the past.  The pain is occurring over the lateral aspect of each knee.  No history of surgery..  Independent review of the left and right knee x-ray from 05/07/20 shows moderate lateral joint space narrowing of the left knee and severe lateral joint space narrowing of the right knee.   Review of Systems See HPI   HISTORY: Past Medical, Surgical, Social, and Family History Reviewed & Updated per EMR.   Pertinent Historical Findings include:  Past Medical History:  Diagnosis Date  . Anxiety   . HOH (hard of hearing)   . Migraine   . Stroke ([facility])   . UTI (urinary tract infection)     Past Surgical History:  Procedure Laterality Date  . ABDOMINAL HYSTERECTOMY    . BLADDER SURGERY      No family history on file.  Social History   Socioeconomic History  . Marital status: Divorced    Spouse name: Not on file  . Number of children: Not on file  . Years of education: Not on file  . Highest education level: Not on file  Occupational History  . Not on file  Tobacco Use  . Smoking status: Never Smoker  . Smokeless tobacco: Never Used  Substance and Sexual Activity  . Alcohol use: No  . Drug use: No  . Sexual activity: Not on file  Other Topics Concern  . Not on file  Social History Narrative  . Not on file   Social Determinants of Health   Financial Resource Strain:   . Difficulty of Paying Living Expenses:   Food Insecurity:   . Worried About Programme researcher, broadcasting/film/video in the Last Year:   . Barista in the Last Year:   Transportation Needs:   . Freight forwarder  (Medical):   Marland Kitchen Lack of Transportation (Non-Medical):   Physical Activity:   . Days of Exercise per Week:   . Minutes of Exercise per Session:   Stress:   . Feeling of Stress :   Social Connections:   . Frequency of Communication with Friends and Family:   . Frequency of Social Gatherings with Friends and Family:   . Attends Religious Services:   . Active Member of Clubs or Organizations:   . Attends Banker Meetings:   Marland Kitchen Marital Status:   Intimate Partner Violence:   . Fear of Current or Ex-Partner:   . Emotionally Abused:   Marland Kitchen Physically Abused:   . Sexually Abused:      PHYSICAL EXAM:  VS: BP 136/85   Pulse 71   Ht 5\' 8"  (1.727 m)   Wt 135 lb (61.2 kg)   BMI 20.53 kg/m  Physical Exam Gen: NAD, alert, cooperative with exam, well-appearing MSK:  Right and left knee: Valgus deformity. Tenderness to palpation over the lateral joint space on the right. No obvious effusion. Normal range of motion. Instability to valgus and varus stress testing. Normal strength resistance. Neurovascularly intact   Aspiration/Injection Procedure Note AUNIKA KIRSTEN [birth year]  Procedure: Injection Indications: Right knee pain  Procedure Details Consent: Risks of procedure as well as the alternatives and risks of each were explained to the (patient/caregiver).  Consent for procedure obtained. Time Out: Verified patient identification, verified procedure, site/side was marked, verified correct patient position, special equipment/implants available, medications/allergies/relevent history reviewed, required imaging and test results available.  Performed.  The area was cleaned with iodine and alcohol swabs.    The right knee superior lateral suprapatellar pouch was injected using 1 cc's of 40 mg Kenalog and 4 cc's of 0.25% bupivacaine with a 22 1 1/2" needle.  Ultrasound was used. Images were obtained in long views showing the injection.     A sterile dressing was  applied.  Patient did tolerate procedure well.  Aspiration/Injection Procedure Note KRISHNA HEUER 06/24/1934  Procedure: Injection Indications: Left knee pain  Procedure Details Consent: Risks of procedure as well as the alternatives and risks of each were explained to the (patient/caregiver).  Consent for procedure obtained. Time Out: Verified patient identification, verified procedure, site/side was marked, verified correct patient position, special equipment/implants available, medications/allergies/relevent history reviewed, required imaging and test results available.  Performed.  The area was cleaned with iodine and alcohol swabs.    The left knee superior lateral suprapatellar pouch was injected using 1 cc's of 40 mg Kenalog and 4 cc's of 0.25% bupivacaine with a 22 1 1/2" needle.  Ultrasound was used. Images were obtained in long views showing the injection.     A sterile dressing was applied.  Patient did tolerate procedure well.   ASSESSMENT & PLAN:   Primary osteoarthritis of both knees Acute on chronic in nature.  She has worse degenerative changes of the right knee compared to the left.  No falls or inciting events. -Counseled on home exercise therapy and supportive care. -Bilateral injections. -Could consider physical therapy.

## 2020-05-17 NOTE — Patient Instructions (Signed)
Nice to meet you  ?Please try ice as needed ?Please send me a message in MyChart with any questions or updates.  ?Please see me back in 4 weeks.  ? ?--Dr. Deirdre Gryder ? ?

## 2020-06-04 DIAGNOSIS — J302 Other seasonal allergic rhinitis: Secondary | ICD-10-CM | POA: Insufficient documentation

## 2020-06-04 DIAGNOSIS — G4489 Other headache syndrome: Secondary | ICD-10-CM | POA: Insufficient documentation

## 2020-06-04 DIAGNOSIS — N390 Urinary tract infection, site not specified: Secondary | ICD-10-CM | POA: Insufficient documentation

## 2020-06-28 ENCOUNTER — Encounter (HOSPITAL_BASED_OUTPATIENT_CLINIC_OR_DEPARTMENT_OTHER): Payer: Self-pay

## 2020-06-28 ENCOUNTER — Emergency Department (HOSPITAL_BASED_OUTPATIENT_CLINIC_OR_DEPARTMENT_OTHER)
Admission: EM | Admit: 2020-06-28 | Discharge: 2020-06-28 | Disposition: A | Discharge status: Home / Self Care | Payer: Medicare HMO | Attending: Emergency Medicine | Admitting: Emergency Medicine

## 2020-06-28 ENCOUNTER — Other Ambulatory Visit: Payer: Self-pay

## 2020-06-28 DIAGNOSIS — R300 Dysuria: Secondary | ICD-10-CM | POA: Diagnosis present

## 2020-06-28 DIAGNOSIS — N390 Urinary tract infection, site not specified: Secondary | ICD-10-CM | POA: Diagnosis not present

## 2020-06-28 DIAGNOSIS — Z79899 Other long term (current) drug therapy: Secondary | ICD-10-CM | POA: Insufficient documentation

## 2020-06-28 LAB — URINALYSIS, ROUTINE W REFLEX MICROSCOPIC
Bilirubin Urine: NEGATIVE
Glucose, UA: NEGATIVE mg/dL
Ketones, ur: NEGATIVE mg/dL
Nitrite: NEGATIVE
Protein, ur: NEGATIVE mg/dL
Specific Gravity, Urine: <1.005 — ABNORMAL LOW (ref 1.005–1.030)
pH: 7 (ref 5.0–8.0)

## 2020-06-28 LAB — CBC WITH DIFFERENTIAL/PLATELET
Abs Immature Granulocytes: 0.03 10*3/uL (ref 0.00–0.07)
Basophils Absolute: 0.1 10*3/uL (ref 0.0–0.1)
Basophils Relative: 1 %
Eosinophils Absolute: 0.1 10*3/uL (ref 0.0–0.5)
Eosinophils Relative: 0 %
HCT: 43.5 % (ref 36.0–46.0)
Hemoglobin: 14.3 g/dL (ref 12.0–15.0)
Immature Granulocytes: 0 %
Lymphocytes Relative: 18 %
Lymphs Abs: 2.1 10*3/uL (ref 0.7–4.0)
MCH: 30.5 pg (ref 26.0–34.0)
MCHC: 32.9 g/dL (ref 30.0–36.0)
MCV: 92.8 fL (ref 80.0–100.0)
Monocytes Absolute: 1 10*3/uL (ref 0.1–1.0)
Monocytes Relative: 9 %
Neutro Abs: 8.4 10*3/uL — ABNORMAL HIGH (ref 1.7–7.7)
Neutrophils Relative %: 72 %
Platelets: 418 10*3/uL — ABNORMAL HIGH (ref 150–400)
RBC: 4.69 MIL/uL (ref 3.87–5.11)
RDW: 13.2 % (ref 11.5–15.5)
WBC: 11.7 10*3/uL — ABNORMAL HIGH (ref 4.0–10.5)
nRBC: 0 % (ref 0.0–0.2)

## 2020-06-28 LAB — COMPREHENSIVE METABOLIC PANEL
ALT: 14 U/L (ref 0–44)
AST: 18 U/L (ref 15–41)
Albumin: 3.9 g/dL (ref 3.5–5.0)
Alkaline Phosphatase: 83 U/L (ref 38–126)
Anion gap: 9 (ref 5–15)
BUN: 12 mg/dL (ref 8–23)
CO2: 27 mmol/L (ref 22–32)
Calcium: 9.2 mg/dL (ref 8.9–10.3)
Chloride: 99 mmol/L (ref 98–111)
Creatinine, Ser: 0.8 mg/dL (ref 0.44–1.00)
GFR calc Af Amer: >60 mL/min (ref >60)
GFR calc non Af Amer: >60 mL/min (ref >60)
Glucose, Bld: 106 mg/dL — ABNORMAL HIGH (ref 70–99)
Potassium: 3.8 mmol/L (ref 3.5–5.1)
Sodium: 135 mmol/L (ref 135–145)
Total Bilirubin: 0.1 mg/dL — ABNORMAL LOW (ref 0.3–1.2)
Total Protein: 7.3 g/dL (ref 6.5–8.1)

## 2020-06-28 LAB — URINALYSIS, MICROSCOPIC (REFLEX): WBC, UA: >50 WBC/hpf (ref 0–5)

## 2020-06-28 MED ORDER — SODIUM CHLORIDE 0.9 % IV SOLN
1.0000 g | Freq: Once | INTRAVENOUS | Status: AC
  Administered 2020-06-28: 1 g via INTRAVENOUS
  Filled 2020-06-28: qty 10

## 2020-06-28 MED ORDER — ACETAMINOPHEN 325 MG PO TABS
650.0000 mg | ORAL_TABLET | Freq: Once | ORAL | Status: AC
  Administered 2020-06-28: 650 mg via ORAL
  Filled 2020-06-28: qty 2

## 2020-06-28 MED ORDER — CEFTRIAXONE SODIUM 1 G IJ SOLR: 1.0000 g | Freq: Once | INTRAMUSCULAR | Status: DC

## 2020-06-28 MED ORDER — ONDANSETRON HCL 4 MG/2ML IJ SOLN
4.0000 mg | Freq: Once | INTRAMUSCULAR | Status: AC
  Administered 2020-06-28: 4 mg via INTRAVENOUS
  Filled 2020-06-28: qty 2

## 2020-06-28 MED ORDER — SODIUM CHLORIDE 0.9 % IV BOLUS
500.0000 mL | Freq: Once | INTRAVENOUS | Status: AC
  Administered 2020-06-28: 500 mL via INTRAVENOUS

## 2020-06-28 MED ORDER — CEPHALEXIN 500 MG PO CAPS: 500.0000 mg | ORAL_CAPSULE | Freq: Three times a day (TID) | ORAL | 0 refills | Status: AC

## 2020-06-28 NOTE — ED Notes (Signed)
ED Provider at bedsideMadilyn Hook, MD.

## 2020-06-28 NOTE — ED Triage Notes (Signed)
Pt states she has a "bladder infection"-states she has taken meds and is no better-NAD-slow gait

## 2020-06-28 NOTE — Discharge Instructions (Addendum)
-  Take antibiotics as prescribed. First dose should be taken on Wednesday, 7/21 in the morning. -Okay to take Tylenol as needed for pain. -If symptoms continue after antibiotics are completed, follow-up with primary care physician.

## 2020-06-28 NOTE — ED Notes (Signed)
Pt requested for IV, monitor to be removed so she could go to the bathroom and then leave. pts paperwork provided, instructions understood. Pt ambulatory out of the dept.

## 2020-06-28 NOTE — ED Provider Notes (Signed)
[facility] EMERGENCY DEPARTMENT Provider Note   CSN: [id number] Arrival date & time: 06/28/20  1655     History Chief Complaint  Patient presents with  . Urinary Tract Infection    Shannon Wise is 86yo F w/ hx of frequent UTI's presents to ED w/ dysuria. Patient reports dysuria started 1 week ago. She bought a "red pill" for her symptoms at the drug store, but says the symptoms have worsened. Denies fevers, polyuria, or change in color. During this time she reports decreased food and liquid intake. Denies n/v/d. She also mentions chronic bilateral knee pain, to which she receives injections.      Past Medical History:  Diagnosis Date  . Anxiety   . HOH (hard of hearing)   . Migraine   . Stroke (HCC)   . UTI (urinary tract infection)     Patient Active Problem List   Diagnosis Date Noted  . Primary osteoarthritis of both knees 05/17/2020  . History of kidney stones 07/11/2018  . Sleep apnea 11/03/2016  . Anxiety 11/03/2016  . TMJ (temporomandibular joint disorder) 10/11/2016  . PVC (premature ventricular contraction) 03/16/2016  . Palpitations 03/16/2016  . Sensorineural hearing loss (SNHL), bilateral 01/04/2016  . Facet arthropathy 12/29/2014  . DDD (degenerative disc disease), lumbar 12/29/2014  . Cervical spine fracture (HCC) 03/25/2014    Past Surgical History:  Procedure Laterality Date  . ABDOMINAL HYSTERECTOMY    . BLADDER SURGERY       OB History   No obstetric history on file.     No family history on file.  Social History   Tobacco Use  . Smoking status: Never Smoker  . Smokeless tobacco: Never Used  Vaping Use  . Vaping Use: Never used  Substance Use Topics  . Alcohol use: No  . Drug use: No    Home Medications Prior to Admission medications   Medication Sig Start Date End Date Taking? Authorizing Provider  acetaminophen (TYLENOL) 500 MG tablet Take 2 tablets (1,000 mg total) by mouth every 6 (six) hours as needed. 05/07/20    Arby Barrette, MD  amoxicillin (AMOXIL) 500 MG tablet Take 1 tablet (500 mg total) by mouth 3 (three) times daily. 08/08/19   Gwyneth Sprout, MD  nitrofurantoin, macrocrystal-monohydrate, (MACROBID) 100 MG capsule Take 1 capsule (100 mg total) by mouth 2 (two) times daily. 09/03/19   Loren Racer, MD  phenazopyridine (PYRIDIUM) 200 MG tablet Take 1 tablet (200 mg total) by mouth 3 (three) times daily as needed for pain. 09/02/19   Loren Racer, MD  traMADol (ULTRAM) 50 MG tablet Take 1 tablet (50 mg total) by mouth every 6 (six) hours as needed. 05/07/20   Arby Barrette, MD  fluticasone (FLONASE) 50 MCG/ACT nasal spray 1 spray by Each Nare route daily for 10 days. 11/07/17 06/03/19  [provider]  loratadine (CLARITIN) 10 MG tablet Take by mouth. 11/07/17 06/14/19  [provider]    Allergies    Codeine, Levaquin [levofloxacin], and Red dye  Review of Systems   Review of Systems  Physical Exam Updated Vital Signs BP 119/89 (BP Location: Left Arm)   Pulse (!) 113   Temp 98.6 F (37 C) (Oral)   Resp 18   Ht 5\' 7"  (1.702 m)   Wt 64.9 kg   SpO2 95%   BMI 22.40 kg/m   Physical Exam Constitutional:      General: She is not in acute distress.    Appearance: She is not ill-appearing.  HENT:     Head: Normocephalic and atraumatic.     Mouth/Throat:     Mouth: Mucous membranes are dry.  Eyes:     General: Lids are normal.     Conjunctiva/sclera: Conjunctivae normal.  Cardiovascular:     Rate and Rhythm: Normal rate and regular rhythm.     Pulses: Normal pulses.     Heart sounds: Normal heart sounds.  Pulmonary:     Effort: Pulmonary effort is normal.     Breath sounds: Normal breath sounds.  Abdominal:     General: Bowel sounds are normal. There is distension.     Palpations: Abdomen is soft.     Comments: Mild suprapubic tenderness.  Musculoskeletal:     Right lower leg: No edema.     Left lower leg: No edema.  Neurological:     General: No  focal deficit present.     Mental Status: She is alert and oriented to person, place, and time.  Psychiatric:        Mood and Affect: Mood normal.        Behavior: Behavior is cooperative.     ED Results / Procedures / Treatments   Labs (all labs ordered are listed, but only abnormal results are displayed) Labs Reviewed  URINALYSIS, ROUTINE W REFLEX MICROSCOPIC - Abnormal; Notable for the following components:      Result Value   APPearance CLOUDY (*)    Specific Gravity, Urine <1.005 (*)    Hgb urine dipstick TRACE (*)    Leukocytes,Ua LARGE (*)    All other components within normal limits  CBC WITH DIFFERENTIAL/PLATELET - Abnormal; Notable for the following components:   WBC 11.7 (*)    Platelets 418 (*)    Neutro Abs 8.4 (*)    All other components within normal limits  COMPREHENSIVE METABOLIC PANEL - Abnormal; Notable for the following components:   Glucose, Bld 106 (*)    Total Bilirubin 0.1 (*)    All other components within normal limits  URINALYSIS, MICROSCOPIC (REFLEX) - Abnormal; Notable for the following components:   Bacteria, UA MANY (*)    All other components within normal limits  URINE CULTURE    EKG None  Radiology No results found.  Procedures Procedures (including critical care time)  Medications Ordered in ED Medications  cefTRIAXone (ROCEPHIN) 1 g in sodium chloride 0.9 % 100 mL IVPB (1 g Intravenous New Bag/Given 06/28/20 2001)  sodium chloride 0.9 % bolus 500 mL (0 mLs Intravenous Stopped 06/28/20 1936)  acetaminophen (TYLENOL) tablet 650 mg (650 mg Oral Given 06/28/20 1822)    ED Course  I have reviewed the triage vital signs and the nursing notes.  Pertinent labs & imaging results that were available during my care of the patient were reviewed by me and considered in my medical decision making (see chart for details).    MDM Rules/Calculators/A&P  Patient is 86yo F w/ hx of frequent UTIs who presents with dysuria and decreased po intake  x1 week. On exam, afebrile, tachycardic, A&Ox4, mm dry, abdomen is soft, distended, mild tenderness in suprapubic region and lower quadrants. Bladder scan 4mL. Giving bolus NS, pending CBC, CMP, UA/UCx.                        Leukocytosis present, CMP unremarkable. UA consistent w/ UTI.  Giving Rocephin 1g.  Patient to be discharged on Keflex for UTI. Discussed with patient findings and plan for antibiotics, patient agreed.  Patient to follow-up with PCP if symptoms persist.  Final Clinical Impression(s) / ED Diagnoses Final diagnoses:  Lower urinary tract infectious disease   Rx / DC Orders ED Discharge Orders         Ordered    cephALEXin (KEFLEX) 500 MG capsule  3 times daily     Discontinue  Reprint     06/28/20 2020           Evlyn Kanner, MD 06/28/20 2027    Tilden Fossa, MD 06/30/20 [phone]

## 2020-06-29 LAB — URINE CULTURE: Culture: >=100000 — AB

## 2020-06-30 ENCOUNTER — Telehealth: Payer: Self-pay | Admitting: *Deleted

## 2020-06-30 NOTE — Telephone Encounter (Signed)
Post ED Visit - Positive Culture Follow-up  Culture report reviewed by antimicrobial stewardship pharmacist: [facility] []  , Pharm.D. []  Enzo Bi, Pharm.D., BCPS AQ-ID []  , Pharm.D., BCPS []  Celedonio Miyamoto, Pharm.D., BCPS []  [place], Garvin Fila.D., BCPS, AAHIVP []  , Pharm.D., BCPS, AAHIVP []  Georgina Pillion, PharmD, BCPS []  , PharmD, BCPS []  [place], PharmD, BCPS []  [place], PharmD []  , PharmD, BCPS []  Estella Husk, PharmD  Pharmacy Team []  Lysle Pearl, PharmD []  , PharmD []  Phillips Climes, PharmD []  , Rph []  Agapito Games) , PharmD []  Verlan Friends, PharmD []  , PharmD []  Mervyn Gay, PharmD []  , PharmD []  Vinnie Level, PharmD []  W[facility], PharmD []  , PharmD []  Len Childs, PharmD   Positive urine culture Treated with Cephalexin, organism sensitive to the same and no further patient follow-up is required at this time. , ParmD  Greer [place] 06/30/2020, 3:20 PM

## 2020-07-05 IMAGING — DX DG CHEST 2V
2 series · 2 of 2 positions shown · non-contrast
Comparison: None.

CLINICAL DATA: Patient states that she has burning in her private
parts " so bad" - the patient also has pain to her chest all morning
and thinks she is having a heart attack , ALTAMIMI, anxiety, hx stroke

EXAM:
CHEST - 2 VIEW

[chest pa]
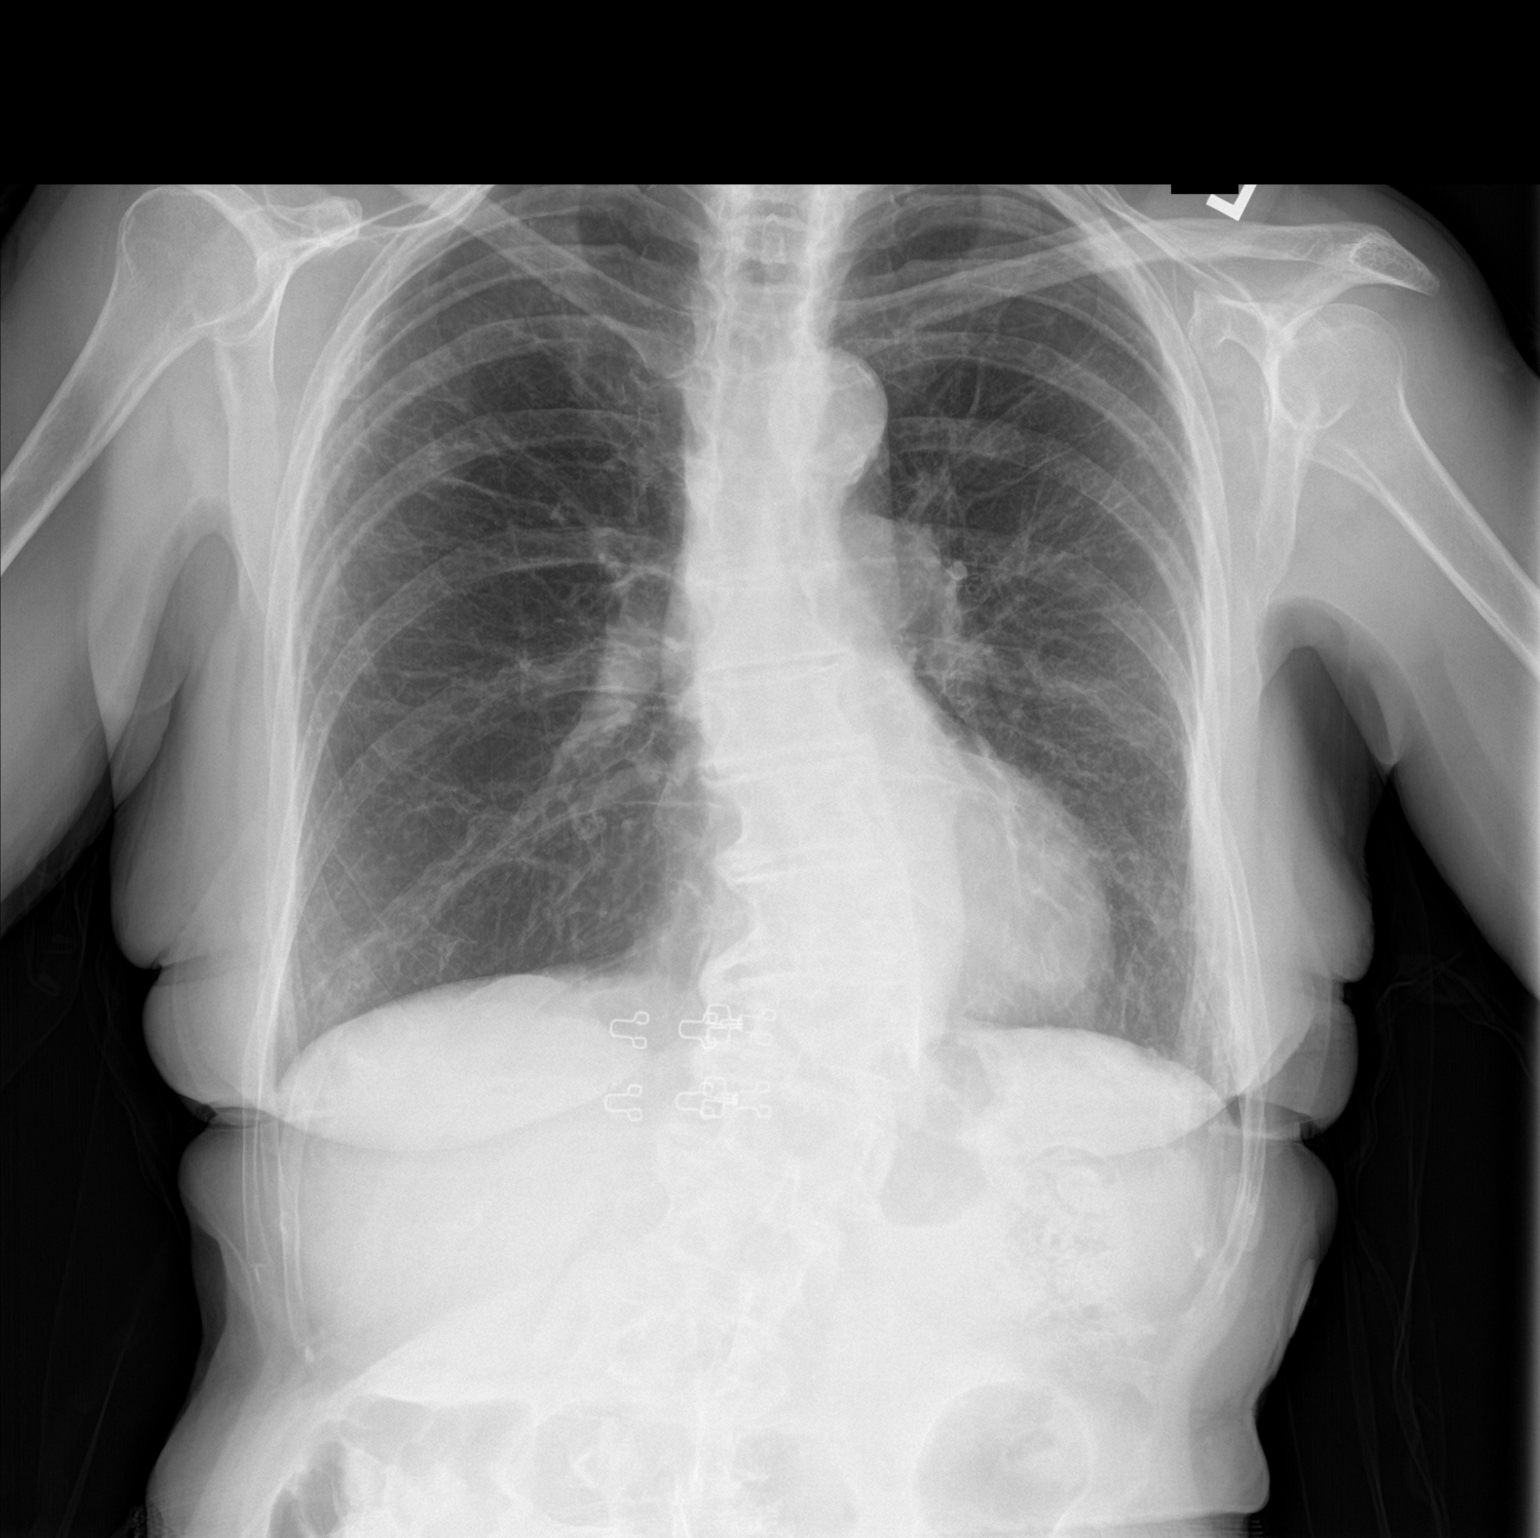

[chest lat]
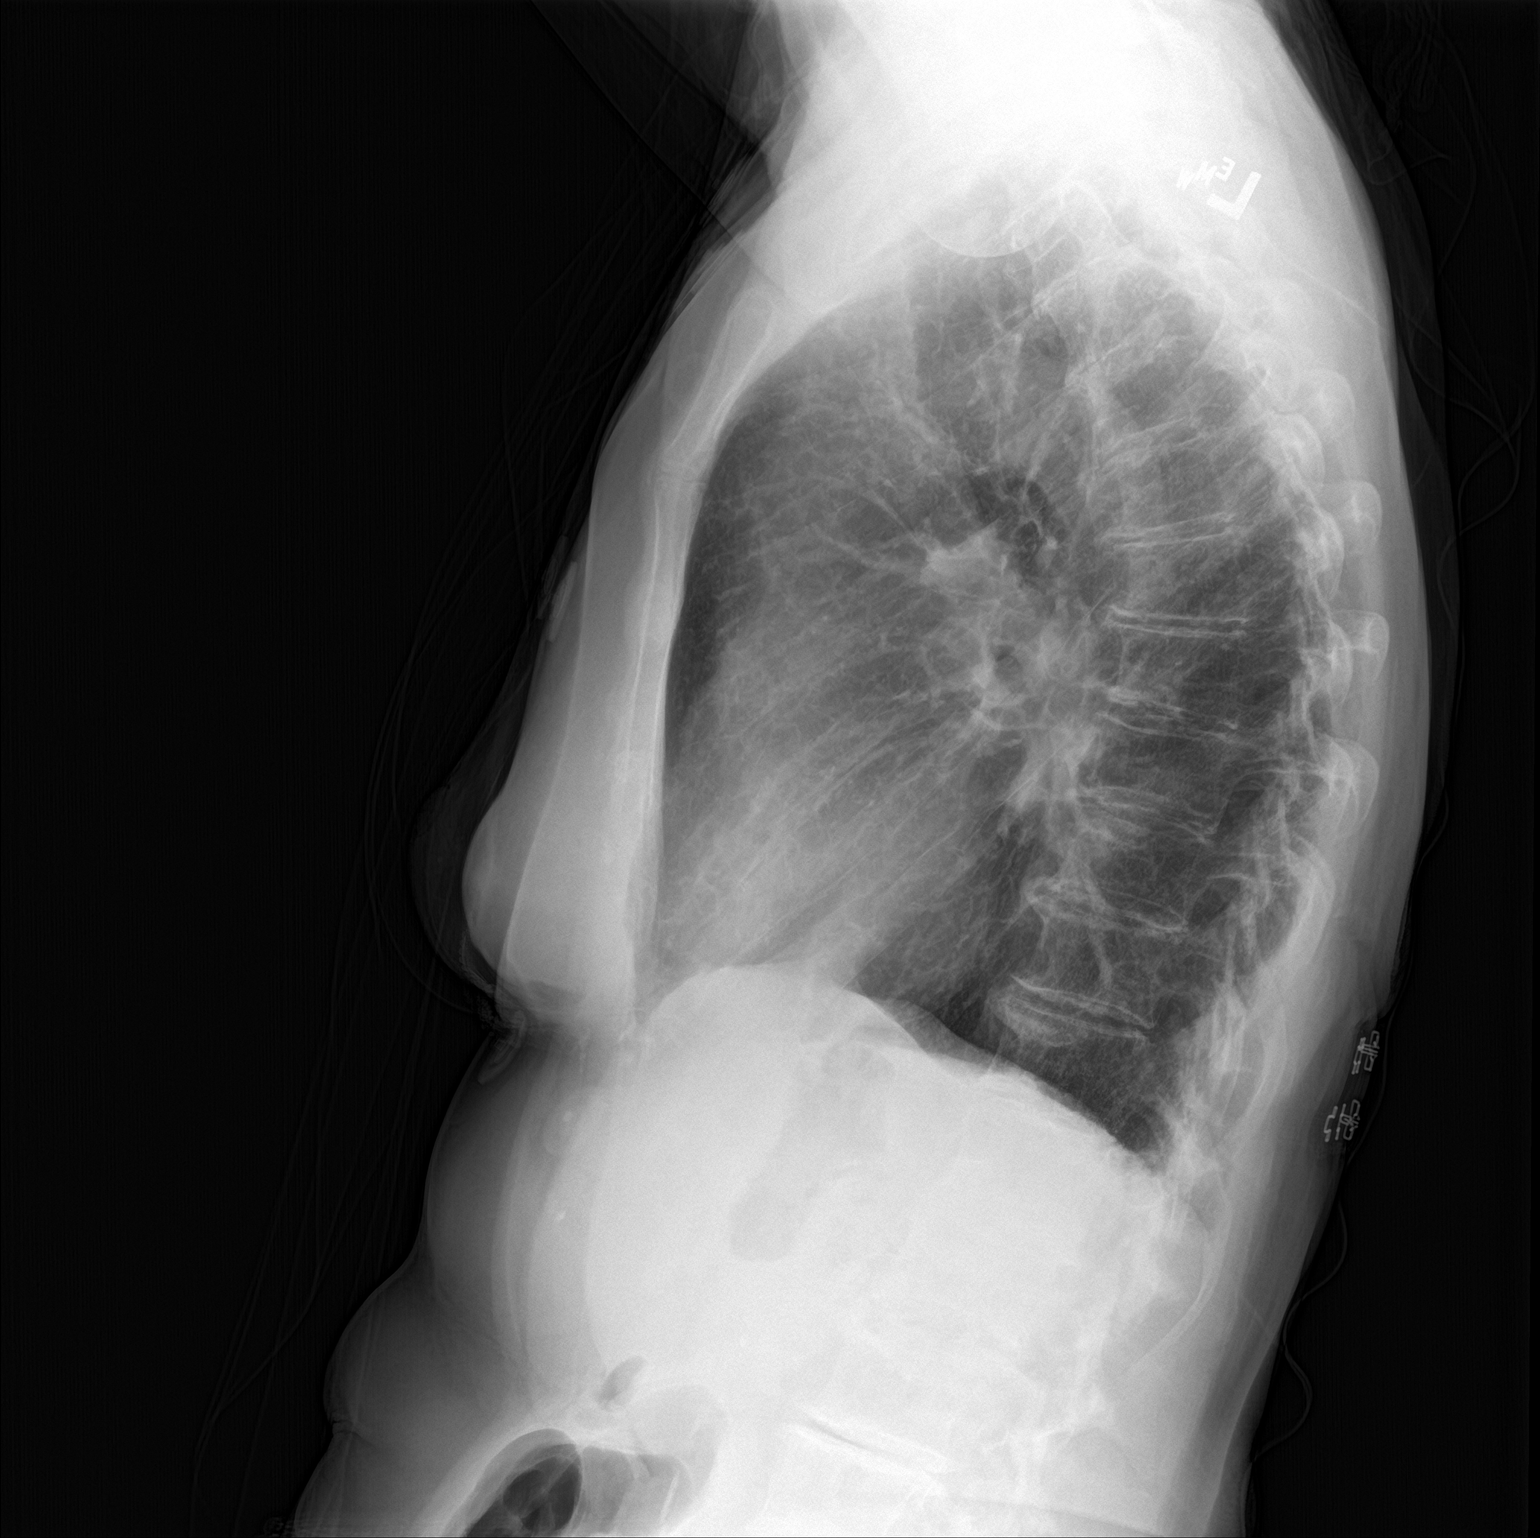

[2 of 2 positions shown; findings below may reference images not displayed]

FINDINGS: Cardiac silhouette is normal in size. No mediastinal or hilar
masses. No evidence of adenopathy.

Lungs are hyperexpanded. There are mildly thickened interstitial
markings that appear chronic. There is no evidence of pneumonia or
pulmonary edema. No pleural effusion or pneumothorax.

Skeletal structures are demineralized but intact.
IMPRESSION: No acute cardiopulmonary disease.

## 2020-07-07 ENCOUNTER — Other Ambulatory Visit: Payer: Self-pay

## 2020-07-07 ENCOUNTER — Ambulatory Visit: Payer: Medicare HMO | Admitting: Family Medicine

## 2020-07-07 ENCOUNTER — Encounter: Payer: Self-pay | Admitting: Family Medicine

## 2020-07-07 DIAGNOSIS — M170 Bilateral primary osteoarthritis of knee: Secondary | ICD-10-CM | POA: Diagnosis not present

## 2020-07-07 NOTE — Patient Instructions (Signed)
Good to see you Please try the rub on medicine  We will pursue the gel injections. We will call once they are in  Please send me a message in MyChart with any questions or updates.  We'll schedule a follow up to have the gel injections done.   --Dr. Jordan Likes

## 2020-07-07 NOTE — Progress Notes (Signed)
Medication Samples have been provided to the patient.  Drug name: Pennsaid       Strength: 2%        Qty: 2 boxes  LOT: [id number]  Exp.Date: 01/2021  Dosing instructions: use a pea size amount and rub gently.  The patient has been instructed regarding the correct time, dose, and frequency of taking this medication, including desired effects and most common side effects.   Kathi Simpers, MA 2:56 PM 07/07/2020

## 2020-07-07 NOTE — Progress Notes (Signed)
  Shannon Wise - 86 y.o. female MRN [id number]  Date of birth: 11-Jan-1934  SUBJECTIVE:  Including CC & ROS.  Chief Complaint  Patient presents with  . Follow-up    bilateral knee    Shannon Wise is a 86 y.o. female that is presenting with acute worsening of her bilateral knee pain.  She has received injections with limited improvement.  The pain seems to be globally over the knee.  Denies any locking or giving way.   Review of Systems See HPI   HISTORY: Past Medical, Surgical, Social, and Family History Reviewed & Updated per EMR.   Pertinent Historical Findings include:  Past Medical History:  Diagnosis Date  . Anxiety   . HOH (hard of hearing)   . Migraine   . Stroke (HCC)   . UTI (urinary tract infection)     Past Surgical History:  Procedure Laterality Date  . ABDOMINAL HYSTERECTOMY    . BLADDER SURGERY      No family history on file.  Social History   Socioeconomic History  . Marital status: Divorced    Spouse name: Not on file  . Number of children: Not on file  . Years of education: Not on file  . Highest education level: Not on file  Occupational History  . Not on file  Tobacco Use  . Smoking status: Never Smoker  . Smokeless tobacco: Never Used  Vaping Use  . Vaping Use: Never used  Substance and Sexual Activity  . Alcohol use: No  . Drug use: No  . Sexual activity: Not on file  Other Topics Concern  . Not on file  Social History Narrative  . Not on file   Social Determinants of Health   Financial Resource Strain:   . Difficulty of Paying Living Expenses:   Food Insecurity:   . Worried About Programme researcher, broadcasting/film/video in the Last Year:   . Barista in the Last Year:   Transportation Needs:   . Freight forwarder (Medical):   Marland Kitchen Lack of Transportation (Non-Medical):   Physical Activity:   . Days of Exercise per Week:   . Minutes of Exercise per Session:   Stress:   . Feeling of Stress :   Social Connections:   . Frequency  of Communication with Friends and Family:   . Frequency of Social Gatherings with Friends and Family:   . Attends Religious Services:   . Active Member of Clubs or Organizations:   . Attends Banker Meetings:   Marland Kitchen Marital Status:   Intimate Partner Violence:   . Fear of Current or Ex-Partner:   . Emotionally Abused:   Marland Kitchen Physically Abused:   . Sexually Abused:      PHYSICAL EXAM:  VS: BP (!) 122/87   Ht 5\' 6"  (1.676 m)   Wt 139 lb (63 kg)   BMI 22.44 kg/m  Physical Exam Gen: NAD, alert, cooperative with exam, well-appearing MSK:  Right and left knee: No obvious effusion. Valgus deformity. Tenderness to palpation over the joint space. Normal strength resistance. Neurovascularly intact     ASSESSMENT & PLAN:   Primary osteoarthritis of both knees Acute worsening of the pain.  Limited relief with steroid injections. -Counseled supportive care. -Provided Pennsaid samples. -We will pursue gel injections.

## 2020-07-07 NOTE — Assessment & Plan Note (Signed)
Acute worsening of the pain.  Limited relief with steroid injections. -Counseled supportive care. -Provided Pennsaid samples. -We will pursue gel injections.

## 2020-08-01 ENCOUNTER — Emergency Department (HOSPITAL_BASED_OUTPATIENT_CLINIC_OR_DEPARTMENT_OTHER)
Admission: EM | Admit: 2020-08-01 | Discharge: 2020-08-01 | Disposition: A | Discharge status: Home / Self Care | Payer: Medicare HMO | Attending: Emergency Medicine | Admitting: Emergency Medicine

## 2020-08-01 ENCOUNTER — Emergency Department (HOSPITAL_BASED_OUTPATIENT_CLINIC_OR_DEPARTMENT_OTHER): Payer: Medicare HMO

## 2020-08-01 ENCOUNTER — Other Ambulatory Visit: Payer: Self-pay

## 2020-08-01 ENCOUNTER — Encounter (HOSPITAL_BASED_OUTPATIENT_CLINIC_OR_DEPARTMENT_OTHER): Payer: Self-pay

## 2020-08-01 DIAGNOSIS — R109 Unspecified abdominal pain: Secondary | ICD-10-CM | POA: Diagnosis present

## 2020-08-01 DIAGNOSIS — Z79899 Other long term (current) drug therapy: Secondary | ICD-10-CM | POA: Insufficient documentation

## 2020-08-01 DIAGNOSIS — K5900 Constipation, unspecified: Secondary | ICD-10-CM | POA: Diagnosis not present

## 2020-08-01 DIAGNOSIS — N390 Urinary tract infection, site not specified: Secondary | ICD-10-CM

## 2020-08-01 LAB — CBC
HCT: 47.9 % — ABNORMAL HIGH (ref 36.0–46.0)
Hemoglobin: 15.3 g/dL — ABNORMAL HIGH (ref 12.0–15.0)
MCH: 30.3 pg (ref 26.0–34.0)
MCHC: 31.9 g/dL (ref 30.0–36.0)
MCV: 94.9 fL (ref 80.0–100.0)
Platelets: 420 10*3/uL — ABNORMAL HIGH (ref 150–400)
RBC: 5.05 MIL/uL (ref 3.87–5.11)
RDW: 13.2 % (ref 11.5–15.5)
WBC: 8.2 10*3/uL (ref 4.0–10.5)
nRBC: 0 % (ref 0.0–0.2)

## 2020-08-01 LAB — URINALYSIS, ROUTINE W REFLEX MICROSCOPIC
Bilirubin Urine: NEGATIVE
Glucose, UA: NEGATIVE mg/dL
Hgb urine dipstick: NEGATIVE
Ketones, ur: NEGATIVE mg/dL
Nitrite: NEGATIVE
Protein, ur: NEGATIVE mg/dL
Specific Gravity, Urine: 1.01 (ref 1.005–1.030)
pH: 7 (ref 5.0–8.0)

## 2020-08-01 LAB — URINALYSIS, MICROSCOPIC (REFLEX)

## 2020-08-01 LAB — BASIC METABOLIC PANEL
Anion gap: 10 (ref 5–15)
BUN: 16 mg/dL (ref 8–23)
CO2: 24 mmol/L (ref 22–32)
Calcium: 9.7 mg/dL (ref 8.9–10.3)
Chloride: 103 mmol/L (ref 98–111)
Creatinine, Ser: 0.82 mg/dL (ref 0.44–1.00)
GFR calc Af Amer: >60 mL/min (ref >60)
GFR calc non Af Amer: >60 mL/min (ref >60)
Glucose, Bld: 114 mg/dL — ABNORMAL HIGH (ref 70–99)
Potassium: 4 mmol/L (ref 3.5–5.1)
Sodium: 137 mmol/L (ref 135–145)

## 2020-08-01 MED ORDER — ACETAMINOPHEN 500 MG PO TABS
500.0000 mg | ORAL_TABLET | Freq: Once | ORAL | Status: AC
  Administered 2020-08-01: 500 mg via ORAL
  Filled 2020-08-01: qty 1

## 2020-08-01 MED ORDER — CEPHALEXIN 500 MG PO CAPS
500.0000 mg | ORAL_CAPSULE | Freq: Three times a day (TID) | ORAL | 0 refills | Status: AC
Start: 2020-08-01 — End: 2020-08-06

## 2020-08-01 MED ORDER — POLYETHYLENE GLYCOL 3350 17 G PO PACK: 17.0000 g | PACK | Freq: Every day | ORAL | 0 refills | Status: DC

## 2020-08-01 NOTE — ED Notes (Signed)
Pt. Reports she has abd. Pain with no nausea or vomiting.  Pt. Reports it started today.  Pt. Reports she has had no diarrhea.  Pt. Is alert and oriented with Hard of hearing noted.

## 2020-08-01 NOTE — ED Notes (Signed)
Pt refuses to have vital signs taken. Pt states "I'm ready to go now"

## 2020-08-01 NOTE — ED Notes (Signed)
Pt. Has gone for her CT scan and is very angry about wearing her gown.  Pt. Wants to wear her own street clothing.

## 2020-08-01 NOTE — Discharge Instructions (Signed)
Take the cephalexin for your urinary tract infection.  Recommend MiraLAX for your constipation.  Please schedule follow-up appoint with primary doctor for recheck later this week.  Return to ER if you develop difficulty breathing, abdominal pain, chest pain or other new concerning symptom.

## 2020-08-01 NOTE — ED Triage Notes (Signed)
Pt arrives with c/o abdominal pain starting today. Pt reports she was brought in by her son in law who left bu twill pick her up. Pt denies NVD, pain of 8. Last BM this morning.   Triage completed by writing, pt very hard of hearing.

## 2020-08-02 NOTE — ED Provider Notes (Signed)
[facility] EMERGENCY DEPARTMENT Provider Note   CSN: [id number] Arrival date & time: 08/01/20  1711     History Chief Complaint  Patient presents with  . Abdominal Pain    Shannon Wise is a 86 y.o. female.  Presents to ER with concern for abdominal pain.  Pain started earlier this morning, crampy, lower abdomen.  Has had no associated nausea, no vomiting or diarrhea.  Had a bowel movement earlier this morning, nonbloody nondiarrhea.  No dysuria, hematuria.  HPI     Past Medical History:  Diagnosis Date  . Anxiety   . HOH (hard of hearing)   . Migraine   . Stroke (HCC)   . UTI (urinary tract infection)     Patient Active Problem List   Diagnosis Date Noted  . Primary osteoarthritis of both knees 05/17/2020  . History of kidney stones 07/11/2018  . Sleep apnea 11/03/2016  . Anxiety 11/03/2016  . TMJ (temporomandibular joint disorder) 10/11/2016  . PVC (premature ventricular contraction) 03/16/2016  . Palpitations 03/16/2016  . Sensorineural hearing loss (SNHL), bilateral 01/04/2016  . Facet arthropathy 12/29/2014  . DDD (degenerative disc disease), lumbar 12/29/2014  . Cervical spine fracture (HCC) 03/25/2014    Past Surgical History:  Procedure Laterality Date  . ABDOMINAL HYSTERECTOMY    . BLADDER SURGERY       OB History   No obstetric history on file.     No family history on file.  Social History   Tobacco Use  . Smoking status: Never Smoker  . Smokeless tobacco: Never Used  Vaping Use  . Vaping Use: Never used  Substance Use Topics  . Alcohol use: No  . Drug use: No    Home Medications Prior to Admission medications   Medication Sig Start Date End Date Taking? Authorizing Provider  acetaminophen (TYLENOL) 500 MG tablet Take 2 tablets (1,000 mg total) by mouth every 6 (six) hours as needed. 05/07/20   Arby Barrette, MD  amoxicillin (AMOXIL) 500 MG tablet Take 1 tablet (500 mg total) by mouth 3 (three) times daily. 08/08/19    Gwyneth Sprout, MD  cephALEXin (KEFLEX) 500 MG capsule Take 1 capsule (500 mg total) by mouth 3 (three) times daily for 5 days. 08/01/20 08/06/20  Milagros Loll, MD  nitrofurantoin, macrocrystal-monohydrate, (MACROBID) 100 MG capsule Take 1 capsule (100 mg total) by mouth 2 (two) times daily. 09/03/19   Loren Racer, MD  phenazopyridine (PYRIDIUM) 200 MG tablet Take 1 tablet (200 mg total) by mouth 3 (three) times daily as needed for pain. 09/02/19   Loren Racer, MD  polyethylene glycol (MIRALAX) 17 g packet Take 17 g by mouth daily. 08/01/20   Milagros Loll, MD  traMADol (ULTRAM) 50 MG tablet Take 1 tablet (50 mg total) by mouth every 6 (six) hours as needed. 05/07/20   Arby Barrette, MD  fluticasone (FLONASE) 50 MCG/ACT nasal spray 1 spray by Each Nare route daily for 10 days. 11/07/17 06/03/19  [provider]  loratadine (CLARITIN) 10 MG tablet Take by mouth. 11/07/17 06/14/19  [provider]    Allergies    Codeine, Levaquin [levofloxacin], and Red dye  Review of Systems   Review of Systems  Constitutional: Negative for chills and fever.  HENT: Negative for ear pain and sore throat.   Eyes: Negative for pain and visual disturbance.  Respiratory: Negative for cough and shortness of breath.   Cardiovascular: Negative for chest pain and palpitations.  Gastrointestinal: Positive for abdominal pain.  Negative for vomiting.  Genitourinary: Negative for dysuria and hematuria.  Musculoskeletal: Negative for arthralgias and back pain.  Skin: Negative for color change and rash.  Neurological: Negative for seizures and syncope.  All other systems reviewed and are negative.   Physical Exam Updated Vital Signs BP (!) 162/91 (BP Location: Left Arm)   Pulse 95   Temp 98.1 F (36.7 C) (Oral)   Resp 16   Ht 5\' 7"  (1.702 m)   Wt 67.3 kg   SpO2 99%   BMI 23.24 kg/m   Physical Exam Vitals and nursing note reviewed.  Constitutional:      General: She is  not in acute distress.    Appearance: She is well-developed.  HENT:     Head: Normocephalic and atraumatic.  Eyes:     Conjunctiva/sclera: Conjunctivae normal.  Cardiovascular:     Rate and Rhythm: Normal rate and regular rhythm.     Heart sounds: No murmur heard.   Pulmonary:     Effort: Pulmonary effort is normal. No respiratory distress.     Breath sounds: Normal breath sounds.  Abdominal:     Palpations: Abdomen is soft.     Comments: Tenderness palpation across lower abdomen, no rebound or guarding noted  Musculoskeletal:     Cervical back: Neck supple.  Skin:    General: Skin is warm and dry.  Neurological:     Mental Status: She is alert.     ED Results / Procedures / Treatments   Labs (all labs ordered are listed, but only abnormal results are displayed) Labs Reviewed  URINALYSIS, ROUTINE W REFLEX MICROSCOPIC - Abnormal; Notable for the following components:      Result Value   Leukocytes,Ua TRACE (*)    All other components within normal limits  CBC - Abnormal; Notable for the following components:   Hemoglobin 15.3 (*)    HCT 47.9 (*)    Platelets 420 (*)    All other components within normal limits  BASIC METABOLIC PANEL - Abnormal; Notable for the following components:   Glucose, Bld 114 (*)    All other components within normal limits  URINALYSIS, MICROSCOPIC (REFLEX) - Abnormal; Notable for the following components:   Bacteria, UA MANY (*)    All other components within normal limits    EKG None  Radiology CT ABDOMEN PELVIS WO CONTRAST  Result Date: 08/01/2020 CLINICAL DATA:  Acute nonlocalized abdominal pain. EXAM: CT ABDOMEN AND PELVIS WITHOUT CONTRAST TECHNIQUE: Multidetector CT imaging of the abdomen and pelvis was performed following the standard protocol without IV contrast. COMPARISON:  Most recent CT 08/02/2019 FINDINGS: Lower chest: Basilar subpleural reticulation, unchanged from prior, chronic small to moderate hiatal hernia. Small fat  containing Bochdalek hernias posteriorly. Hepatobiliary: Subcentimeter cyst adjacent to the gallbladder fossa. Gallbladder physiologically distended, no calcified stone. No biliary dilatation. Pancreas: No ductal dilatation or inflammation. Spleen: Normal in size without focal abnormality. Adrenals/Urinary Tract: No adrenal nodule. No hydronephrosis or perinephric edema. Small nonobstructing stones in both kidneys. Simple cyst in the mid right kidney. Both ureters are decompressed without stones along the course. Urinary bladder is unremarkable. Stomach/Bowel: Small to moderate hiatal hernia. Distal stomach is decompressed. Normal positioning of the duodenum and ligament of Treitz. No small bowel obstruction or inflammation. Appendix is not definitively visualized. There is no evidence of appendicitis. Moderate stool burden throughout the colon. There is diffuse colonic redundancy. Occasional sigmoid colonic diverticulosis without diverticulitis. There is no abnormal rectal distension or evidence of fecal impaction. Vascular/Lymphatic:  Aortic and branch atherosclerosis. No aortic aneurysm. No bulky abdominopelvic adenopathy. Reproductive: Status post hysterectomy. No adnexal masses. Other: No free air, free fluid, or intra-abdominal fluid collection. Tiny fat containing umbilical hernia. Musculoskeletal: Scoliosis and degenerative change in the spine. There are no acute or suspicious osseous abnormalities. IMPRESSION: 1. No acute abnormality in the abdomen/pelvis. 2. Moderate colonic stool burden with colonic redundancy, suggesting constipation. Mild sigmoid colonic diverticulosis without diverticulitis. 3. Nonobstructing bilateral nephrolithiasis. 4. Small to moderate hiatal hernia. Aortic Atherosclerosis (ICD10-I70.0). Electronically Signed   By: Narda Rutherford M.D.   On: 08/01/2020 20:55    Procedures Procedures (including critical care time)  Medications Ordered in ED Medications  acetaminophen  (TYLENOL) tablet 500 mg (500 mg Oral Given 08/01/20 2041)    ED Course  I have reviewed the triage vital signs and the nursing notes.  Pertinent labs & imaging results that were available during my care of the patient were reviewed by me and considered in my medical decision making (see chart for details).    MDM Rules/Calculators/A&P                         86 year old lady who presented to ER with concern for lower abdominal pain.  On exam she is remarkably well-appearing with normal vital signs.  Had some tenderness in suprapubic region but no rebound or guarding was appreciated.  CT negative for acute pathology.  UA concerning for UTI.  She is appropriate for discharge palpation this time.  Will start a course of cephalexin.  Recommend recheck with primary doctor.  Advised MiraLAX for constipation.    After the discussed management above, the patient was determined to be safe for discharge.  The patient was in agreement with this plan and all questions regarding their care were answered.  ED return precautions were discussed and the patient will return to the ED with any significant worsening of condition.  Final Clinical Impression(s) / ED Diagnoses Final diagnoses:  Urinary tract infection without hematuria, site unspecified  Constipation, unspecified constipation type    Rx / DC Orders ED Discharge Orders         Ordered    polyethylene glycol (MIRALAX) 17 g packet  Daily        08/01/20 2110    cephALEXin (KEFLEX) 500 MG capsule  3 times daily        08/01/20 2110           Milagros Loll, MD 08/02/20 1541

## 2020-08-24 ENCOUNTER — Telehealth: Payer: Self-pay

## 2020-08-24 NOTE — Telephone Encounter (Signed)
Reviewed chart. Can you come by my office. I don't think will take on as new pt.

## 2020-08-24 NOTE — Telephone Encounter (Signed)
Caller: [place]  Call Back # [phone]  Patient looking become new patient of Esperanza Richters and patient would like to be seen prior to New patient appointment for leg pain. First Available for new patient appointment is in November 2021.  Please Advise

## 2020-09-05 ENCOUNTER — Telehealth: Payer: Self-pay | Admitting: Family Medicine

## 2020-09-05 NOTE — Telephone Encounter (Signed)
Pt's son-in -law called to see when he can bring pt for injection states her knees are really causing her pain ( spk w/Paula W/ CMA  Says will have to order& receive injectibles before scheduling appt-- )   --Advised Mr. Larina Bras we would call him once it has been rcvd @ [phone]  --glh

## 2020-09-14 ENCOUNTER — Ambulatory Visit: Payer: Medicare HMO | Admitting: Family Medicine

## 2020-09-14 ENCOUNTER — Ambulatory Visit: Payer: Self-pay

## 2020-09-14 ENCOUNTER — Other Ambulatory Visit: Payer: Self-pay

## 2020-09-14 DIAGNOSIS — M170 Bilateral primary osteoarthritis of knee: Secondary | ICD-10-CM

## 2020-09-14 NOTE — Patient Instructions (Signed)
Good to see you Please try ice   Please send me a message in MyChart with any questions or updates.  Please see me back in 4-6 weeks.   --Dr. Kimberlee Shoun  

## 2020-09-14 NOTE — Progress Notes (Signed)
SHAMELA HAYDON - 86 y.o. female MRN [id number]  Date of birth: 06-Oct-1934  SUBJECTIVE:  Including CC & ROS.  No chief complaint on file.   ARMIDA VICKROY is a 86 y.o. female that is presenting for her gel injections..   Review of Systems See HPI   HISTORY: Past Medical, Surgical, Social, and Family History Reviewed & Updated per EMR.   Pertinent Historical Findings include:  Past Medical History:  Diagnosis Date  . Anxiety   . HOH (hard of hearing)   . Migraine   . Stroke (HCC)   . UTI (urinary tract infection)     Past Surgical History:  Procedure Laterality Date  . ABDOMINAL HYSTERECTOMY    . BLADDER SURGERY      No family history on file.  Social History   Socioeconomic History  . Marital status: Divorced    Spouse name: Not on file  . Number of children: Not on file  . Years of education: Not on file  . Highest education level: Not on file  Occupational History  . Not on file  Tobacco Use  . Smoking status: Never Smoker  . Smokeless tobacco: Never Used  Vaping Use  . Vaping Use: Never used  Substance and Sexual Activity  . Alcohol use: No  . Drug use: No  . Sexual activity: Not on file  Other Topics Concern  . Not on file  Social History Narrative  . Not on file   Social Determinants of Health   Financial Resource Strain:   . Difficulty of Paying Living Expenses: Not on file  Food Insecurity:   . Worried About Programme researcher, broadcasting/film/video in the Last Year: Not on file  . Ran Out of Food in the Last Year: Not on file  Transportation Needs:   . Lack of Transportation (Medical): Not on file  . Lack of Transportation (Non-Medical): Not on file  Physical Activity:   . Days of Exercise per Week: Not on file  . Minutes of Exercise per Session: Not on file  Stress:   . Feeling of Stress : Not on file  Social Connections:   . Frequency of Communication with Friends and Family: Not on file  . Frequency of Social Gatherings with Friends and Family: Not  on file  . Attends Religious Services: Not on file  . Active Member of Clubs or Organizations: Not on file  . Attends Banker Meetings: Not on file  . Marital Status: Not on file  Intimate Partner Violence:   . Fear of Current or Ex-Partner: Not on file  . Emotionally Abused: Not on file  . Physically Abused: Not on file  . Sexually Abused: Not on file     PHYSICAL EXAM:  VS: There were no vitals taken for this visit. Physical Exam Gen: NAD, alert, cooperative with exam, well-appearing    Aspiration/Injection Procedure Note WILSON SAMPLE 07-Apr-1934  Procedure: Injection Indications: Left knee pain  Procedure Details Consent: Risks of procedure as well as the alternatives and risks of each were explained to the (patient/caregiver).  Consent for procedure obtained. Time Out: Verified patient identification, verified procedure, site/side was marked, verified correct patient position, special equipment/implants available, medications/allergies/relevent history reviewed, required imaging and test results available.  Performed.  The area was cleaned with iodine and alcohol swabs.    The left knee superior lateral suprapatellar pouch was injected using 4 cc's of 1% lidocaine with a 22 1 1/2" needle.  The syringe was  switched and a 6 mL of Synvisc one was injected. Ultrasound was used. Images were obtained in  Long views showing the injection.    A sterile dressing was applied.  Patient did tolerate procedure well.   Aspiration/Injection Procedure Note AANYA HAYNES May 17, 1934  Procedure: Injection Indications: Right knee pain   Procedure Details Consent: Risks of procedure as well as the alternatives and risks of each were explained to the (patient/caregiver).  Consent for procedure obtained. Time Out: Verified patient identification, verified procedure, site/side was marked, verified correct patient position, special equipment/implants available,  medications/allergies/relevent history reviewed, required imaging and test results available.  Performed.  The area was cleaned with iodine and alcohol swabs.    The right knee superior lateral suprapatellar pouch was injected using 4 cc's of 1% lidocaine with a 22 1 1/2" needle.  The syringe was switched and a 6 mL of synvisc One was injected. Ultrasound was used. Images were obtained in  Long views showing the injection.    A sterile dressing was applied.  Patient did tolerate procedure well.   ASSESSMENT & PLAN:   Primary osteoarthritis of both knees Provided gel injections today. -Counseled supportive care. -Could consider physical therapy.

## 2020-09-15 NOTE — Assessment & Plan Note (Signed)
Provided gel injections today. -Counseled supportive care. -Could consider physical therapy.

## 2020-09-29 ENCOUNTER — Emergency Department (HOSPITAL_BASED_OUTPATIENT_CLINIC_OR_DEPARTMENT_OTHER)
Admission: EM | Admit: 2020-09-29 | Discharge: 2020-09-29 | Disposition: A | Discharge status: Home / Self Care | Payer: Medicare HMO | Attending: Emergency Medicine | Admitting: Emergency Medicine

## 2020-09-29 DIAGNOSIS — N3001 Acute cystitis with hematuria: Secondary | ICD-10-CM | POA: Diagnosis not present

## 2020-09-29 DIAGNOSIS — R300 Dysuria: Secondary | ICD-10-CM | POA: Diagnosis present

## 2020-09-29 LAB — URINALYSIS, ROUTINE W REFLEX MICROSCOPIC
Bilirubin Urine: NEGATIVE
Glucose, UA: NEGATIVE mg/dL
Ketones, ur: NEGATIVE mg/dL
Nitrite: NEGATIVE
Protein, ur: NEGATIVE mg/dL
Specific Gravity, Urine: 1.015 (ref 1.005–1.030)
pH: 8 (ref 5.0–8.0)

## 2020-09-29 LAB — URINALYSIS, MICROSCOPIC (REFLEX)

## 2020-09-29 MED ORDER — NITROFURANTOIN MONOHYD MACRO 100 MG PO CAPS
100.0000 mg | ORAL_CAPSULE | Freq: Two times a day (BID) | ORAL | 0 refills | Status: DC
Start: 2020-09-29 — End: 2021-05-03

## 2020-09-29 MED ORDER — PHENAZOPYRIDINE HCL 200 MG PO TABS
200.0000 mg | ORAL_TABLET | Freq: Three times a day (TID) | ORAL | 0 refills | Status: DC | PRN
Start: 2020-09-29 — End: 2021-05-03

## 2020-09-29 NOTE — Discharge Instructions (Signed)
Return to the ER if you start having fever, vomiting, confusion or worsening symptoms.

## 2020-09-29 NOTE — ED Provider Notes (Signed)
[facility] EMERGENCY DEPARTMENT Provider Note   CSN: [id number] Arrival date & time: 09/29/20  1747     History Chief Complaint  Patient presents with  . Recurrent UTI    Shannon Wise is a 86 y.o. female.  Patient is an 86 year old female with a history of stroke, migraines, recurrent UTIs who is presenting today with complaint of dysuria, frequency and urgency that started 2 days ago.  Patient reports that she does have some suprapubic discomfort but denies any fever, vomiting, back pain.  She has not had any diarrhea.  She reports this feels just like when she has had a urinary tract infection in the past.  She has not noticed any bleeding.  She reports she does not know why she keeps getting frequent UTIs but has not seen anybody about it.  No recent medication changes.  The history is provided by the patient.       Past Medical History:  Diagnosis Date  . Anxiety   . HOH (hard of hearing)   . Migraine   . Stroke (HCC)   . UTI (urinary tract infection)     Patient Active Problem List   Diagnosis Date Noted  . Primary osteoarthritis of both knees 05/17/2020  . History of kidney stones 07/11/2018  . Sleep apnea 11/03/2016  . Anxiety 11/03/2016  . TMJ (temporomandibular joint disorder) 10/11/2016  . PVC (premature ventricular contraction) 03/16/2016  . Palpitations 03/16/2016  . Sensorineural hearing loss (SNHL), bilateral 01/04/2016  . Facet arthropathy 12/29/2014  . DDD (degenerative disc disease), lumbar 12/29/2014  . Cervical spine fracture (HCC) 03/25/2014    Past Surgical History:  Procedure Laterality Date  . ABDOMINAL HYSTERECTOMY    . BLADDER SURGERY       OB History   No obstetric history on file.     No family history on file.  Social History   Tobacco Use  . Smoking status: Never Smoker  . Smokeless tobacco: Never Used  Vaping Use  . Vaping Use: Never used  Substance Use Topics  . Alcohol use: No  . Drug use: No     Home Medications Prior to Admission medications   Medication Sig Start Date End Date Taking? Authorizing Provider  acetaminophen (TYLENOL) 500 MG tablet Take 2 tablets (1,000 mg total) by mouth every 6 (six) hours as needed. 05/07/20   Arby Barrette, MD  amoxicillin (AMOXIL) 500 MG tablet Take 1 tablet (500 mg total) by mouth 3 (three) times daily. 08/08/19   Gwyneth Sprout, MD  nitrofurantoin, macrocrystal-monohydrate, (MACROBID) 100 MG capsule Take 1 capsule (100 mg total) by mouth 2 (two) times daily. 09/03/19   Loren Racer, MD  phenazopyridine (PYRIDIUM) 200 MG tablet Take 1 tablet (200 mg total) by mouth 3 (three) times daily as needed for pain. 09/02/19   Loren Racer, MD  polyethylene glycol (MIRALAX) 17 g packet Take 17 g by mouth daily. 08/01/20   Milagros Loll, MD  traMADol (ULTRAM) 50 MG tablet Take 1 tablet (50 mg total) by mouth every 6 (six) hours as needed. 05/07/20   Arby Barrette, MD  fluticasone (FLONASE) 50 MCG/ACT nasal spray 1 spray by Each Nare route daily for 10 days. 11/07/17 06/03/19  [provider]  loratadine (CLARITIN) 10 MG tablet Take by mouth. 11/07/17 06/14/19  [provider]    Allergies    Codeine, Levaquin [levofloxacin], and Red dye  Review of Systems   Review of Systems  All other systems reviewed and are  negative.   Physical Exam Updated Vital Signs BP (!) 158/81 (BP Location: Left Arm)   Pulse 93   Temp 98.9 F (37.2 C) (Oral)   Resp 16   SpO2 98%   Physical Exam Vitals and nursing note reviewed.  Constitutional:      General: She is not in acute distress.    Appearance: Normal appearance. She is well-developed and normal weight.  HENT:     Head: Normocephalic and atraumatic.  Eyes:     Pupils: Pupils are equal, round, and reactive to light.  Cardiovascular:     Rate and Rhythm: Normal rate and regular rhythm.     Heart sounds: Normal heart sounds. No murmur heard.  No friction rub.  Pulmonary:      Effort: Pulmonary effort is normal.     Breath sounds: Normal breath sounds. No wheezing or rales.  Abdominal:     General: Bowel sounds are normal. There is no distension.     Palpations: Abdomen is soft.     Tenderness: There is abdominal tenderness in the suprapubic area. There is no right CVA tenderness, left CVA tenderness, guarding or rebound.  Musculoskeletal:        General: No tenderness. Normal range of motion.     Right lower leg: No edema.     Left lower leg: No edema.     Comments: No edema  Skin:    General: Skin is warm and dry.     Findings: No rash.  Neurological:     General: No focal deficit present.     Mental Status: She is alert and oriented to person, place, and time. Mental status is at baseline.     Cranial Nerves: No cranial nerve deficit.     Motor: No weakness.  Psychiatric:        Mood and Affect: Mood normal.        Behavior: Behavior normal.        Thought Content: Thought content normal.     ED Results / Procedures / Treatments   Labs (all labs ordered are listed, but only abnormal results are displayed) Labs Reviewed  URINALYSIS, ROUTINE W REFLEX MICROSCOPIC - Abnormal; Notable for the following components:      Result Value   APPearance CLOUDY (*)    Hgb urine dipstick TRACE (*)    Leukocytes,Ua SMALL (*)    All other components within normal limits  URINALYSIS, MICROSCOPIC (REFLEX) - Abnormal; Notable for the following components:   Bacteria, UA FEW (*)    All other components within normal limits  URINE CULTURE    EKG None  Radiology No results found.  Procedures Procedures (including critical care time)  Medications Ordered in ED Medications - No data to display  ED Course  I have reviewed the triage vital signs and the nursing notes.  Pertinent labs & imaging results that were available during my care of the patient were reviewed by me and considered in my medical decision making (see chart for details).    MDM  Rules/Calculators/A&P                         Elderly female presenting today with complaint of frequency, urgency and dysuria that started 2 days ago.  Patient denies any systemic symptoms such as fever, vomiting.  She has no flank tenderness and only mild suprapubic tenderness on exam.  Urine with small leukocytes, hemoglobin and 11-20 white cells.  Patient did  have a urine culture done in July that showed strep viridans but last culture we had was from a year ago that showed E. coli that was resistant to cephalosporins, Cipro.  We will culture patient's urine today and start her on Macrobid.  Encourage close follow-up with PCP or uro-Gyn specialist.  Final Clinical Impression(s) / ED Diagnoses Final diagnoses:  Acute cystitis with hematuria    Rx / DC Orders ED Discharge Orders         Ordered    nitrofurantoin, macrocrystal-monohydrate, (MACROBID) 100 MG capsule  2 times daily        09/29/20 1847    phenazopyridine (PYRIDIUM) 200 MG tablet  3 times daily PRN        09/29/20 1847           Gwyneth Sprout, MD 09/29/20 1847

## 2020-09-29 NOTE — ED Triage Notes (Signed)
Pt reports " I have a urinary tract infection"

## 2020-10-02 LAB — URINE CULTURE: Culture: >=100000 — AB

## 2020-10-03 ENCOUNTER — Telehealth: Payer: Self-pay | Admitting: *Deleted

## 2020-10-03 NOTE — Telephone Encounter (Signed)
Post ED Visit - Positive Culture Follow-up  Culture report reviewed by antimicrobial stewardship pharmacist: [facility] []  , Pharm.D. []  Enzo Bi, Pharm.D., BCPS AQ-ID []  , Pharm.D., BCPS []  Celedonio Miyamoto, .D., BCPS []  [place], .D., BCPS, AAHIVP []  Georgina Pillion, Pharm.D., BCPS, AAHIVP []  [address], PharmD, BCPS []  , PharmD, BCPS []  [place], PharmD, BCPS []  [place], PharmD []  , PharmD, BCPS []  Estella Husk, PharmD  Pharmacy Team []  Lysle Pearl, PharmD []  , PharmD []  Phillips Climes, PharmD []  , Rph []  Agapito Games) , PharmD []  Verlan Friends, PharmD []  , PharmD []  Mervyn Gay, PharmD []  , PharmD []  Vinnie Level, PharmD []  [facility], PharmD []  , PharmD []  Len Childs, PharmD   Positive *urineculture Treated with Nitrofurantoin Monohyd Macro, organism sensitive to the same and no further patient follow-up is required at this time. , PharmD  Greer Pickerel Talley 10/03/2020, 10:05 AM

## 2020-10-12 ENCOUNTER — Other Ambulatory Visit: Payer: Self-pay

## 2020-10-12 ENCOUNTER — Telehealth: Payer: Self-pay | Admitting: Family Medicine

## 2020-10-12 ENCOUNTER — Ambulatory Visit (INDEPENDENT_AMBULATORY_CARE_PROVIDER_SITE_OTHER): Payer: Medicare HMO | Admitting: Family Medicine

## 2020-10-12 ENCOUNTER — Encounter: Payer: Self-pay | Admitting: Family Medicine

## 2020-10-12 VITALS — BP 130/96 | HR 98 | Ht 67.0 in | Wt 140.0 lb

## 2020-10-12 DIAGNOSIS — M170 Bilateral primary osteoarthritis of knee: Secondary | ICD-10-CM

## 2020-10-12 DIAGNOSIS — Z13820 Encounter for screening for osteoporosis: Secondary | ICD-10-CM | POA: Insufficient documentation

## 2020-10-12 MED ORDER — PREDNISONE 5 MG PO TABS: ORAL_TABLET | ORAL | 0 refills | Status: DC
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
## 2020-10-12 NOTE — Assessment & Plan Note (Signed)
Has not been screened for osteoporosis. -Bone density. - we will check vitamin D going forward as well.

## 2020-10-12 NOTE — Progress Notes (Signed)
Shannon Wise - 86 y.o. female MRN [id number]  Date of birth: 18-Feb-1934  SUBJECTIVE:  Including CC & ROS.  Chief Complaint  Patient presents with  . Follow-up    bilateral knee    Shannon Wise is a 86 y.o. female that is presenting with ongoing pain in each knee.  We have tried steroid injections and gel injections with limited improvement.   Review of Systems See HPI   HISTORY: Past Medical, Surgical, Social, and Family History Reviewed & Updated per EMR.   Pertinent Historical Findings include:  Past Medical History:  Diagnosis Date  . Anxiety   . HOH (hard of hearing)   . Migraine   . Stroke (HCC)   . UTI (urinary tract infection)     Past Surgical History:  Procedure Laterality Date  . ABDOMINAL HYSTERECTOMY    . BLADDER SURGERY      No family history on file.  Social History   Socioeconomic History  . Marital status: Divorced    Spouse name: Not on file  . Number of children: Not on file  . Years of education: Not on file  . Highest education level: Not on file  Occupational History  . Not on file  Tobacco Use  . Smoking status: Never Smoker  . Smokeless tobacco: Never Used  Vaping Use  . Vaping Use: Never used  Substance and Sexual Activity  . Alcohol use: No  . Drug use: No  . Sexual activity: Not on file  Other Topics Concern  . Not on file  Social History Narrative  . Not on file   Social Determinants of Health   Financial Resource Strain:   . Difficulty of Paying Living Expenses: Not on file  Food Insecurity:   . Worried About Programme researcher, broadcasting/film/video in the Last Year: Not on file  . Ran Out of Food in the Last Year: Not on file  Transportation Needs:   . Lack of Transportation (Medical): Not on file  . Lack of Transportation (Non-Medical): Not on file  Physical Activity:   . Days of Exercise per Week: Not on file  . Minutes of Exercise per Session: Not on file  Stress:   . Feeling of Stress : Not on file  Social Connections:     . Frequency of Communication with Friends and Family: Not on file  . Frequency of Social Gatherings with Friends and Family: Not on file  . Attends Religious Services: Not on file  . Active Member of Clubs or Organizations: Not on file  . Attends Banker Meetings: Not on file  . Marital Status: Not on file  Intimate Partner Violence:   . Fear of Current or Ex-Partner: Not on file  . Emotionally Abused: Not on file  . Physically Abused: Not on file  . Sexually Abused: Not on file     PHYSICAL EXAM:  VS: BP (!) 130/96   Pulse 98   Ht 5\' 7"  (1.702 m)   Wt 140 lb (63.5 kg)   BMI 21.93 kg/m  Physical Exam Gen: NAD, alert, cooperative with exam, well-appearing    ASSESSMENT & PLAN:   Primary osteoarthritis of both knees Pain is acutely worsening.  We have tried steroid injections as well as gel injections.  She has had minimal improvement.  Concerned that she may have more bone related pain with insufficiency fractures as the pain is severe at night. -Counseled on home exercise therapy and supportive care. -Prednisone. -MRI of  the left and right knee to evaluate for insufficiency fracture.  Screening for osteoporosis Has not been screened for osteoporosis. -Bone density. - we will check vitamin D going forward as well.

## 2020-10-12 NOTE — Patient Instructions (Signed)
Good to see you Please try tylenol  Please try the prednisone   I will call once the Bone density is resulted Please send me a message in MyChart with any questions or updates.  We will setup a virtual visit once the MRIs are resulted.   --Dr. Jordan Likes

## 2020-10-12 NOTE — Assessment & Plan Note (Signed)
Pain is acutely worsening.  We have tried steroid injections as well as gel injections.  She has had minimal improvement.  Concerned that she may have more bone related pain with insufficiency fractures as the pain is severe at night. -Counseled on home exercise therapy and supportive care. -Prednisone. -MRI of the left and right knee to evaluate for insufficiency fracture.

## 2020-10-17 ENCOUNTER — Telehealth: Payer: Self-pay | Admitting: Family Medicine
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
## 2020-10-18 ENCOUNTER — Telehealth: Payer: Self-pay | Admitting: Family Medicine

## 2020-10-18 DIAGNOSIS — S12091A Other nondisplaced fracture of first cervical vertebra, initial encounter for closed fracture: Secondary | ICD-10-CM
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
## 2020-10-24 ENCOUNTER — Ambulatory Visit (HOSPITAL_BASED_OUTPATIENT_CLINIC_OR_DEPARTMENT_OTHER)
Admission: RE | Admit: 2020-10-24 | Discharge: 2020-10-24 | Disposition: A | Discharge status: Home / Self Care | Payer: Medicare HMO | Source: Ambulatory Visit | Attending: Family Medicine | Admitting: Family Medicine

## 2020-10-24 ENCOUNTER — Other Ambulatory Visit: Payer: Self-pay

## 2020-10-24 DIAGNOSIS — M8580 Other specified disorders of bone density and structure, unspecified site: Secondary | ICD-10-CM | POA: Insufficient documentation

## 2020-10-24 DIAGNOSIS — Z780 Asymptomatic menopausal state: Secondary | ICD-10-CM | POA: Insufficient documentation

## 2020-10-24 DIAGNOSIS — Z13820 Encounter for screening for osteoporosis: Secondary | ICD-10-CM | POA: Diagnosis present

## 2020-10-26 ENCOUNTER — Telehealth: Payer: Self-pay | Admitting: Family Medicine

## 2020-10-26 NOTE — Telephone Encounter (Signed)
Informed son in law of bone density results.   Myra Rude, MD [facility] 10/26/2020, 2:31 PM

## 2020-12-23 ENCOUNTER — Encounter (HOSPITAL_BASED_OUTPATIENT_CLINIC_OR_DEPARTMENT_OTHER): Payer: Self-pay | Admitting: *Deleted

## 2020-12-23 ENCOUNTER — Other Ambulatory Visit: Payer: Self-pay

## 2020-12-23 ENCOUNTER — Emergency Department (HOSPITAL_BASED_OUTPATIENT_CLINIC_OR_DEPARTMENT_OTHER)
Admission: EM | Admit: 2020-12-23 | Discharge: 2020-12-23 | Disposition: A | Discharge status: Home / Self Care | Payer: Medicare HMO | Attending: Emergency Medicine | Admitting: Emergency Medicine

## 2020-12-23 DIAGNOSIS — R0981 Nasal congestion: Secondary | ICD-10-CM | POA: Diagnosis not present

## 2020-12-23 DIAGNOSIS — R519 Headache, unspecified: Secondary | ICD-10-CM | POA: Insufficient documentation

## 2020-12-23 DIAGNOSIS — Z20822 Contact with and (suspected) exposure to covid-19: Secondary | ICD-10-CM | POA: Insufficient documentation

## 2020-12-23 DIAGNOSIS — M542 Cervicalgia: Secondary | ICD-10-CM | POA: Diagnosis not present

## 2020-12-23 LAB — RESP PANEL BY RT-PCR (FLU A&B, COVID) ARPGX2
Influenza A by PCR: NEGATIVE
Influenza B by PCR: NEGATIVE
SARS Coronavirus 2 by RT PCR: NEGATIVE

## 2020-12-23 MED ORDER — TRAMADOL HCL 50 MG PO TABS
50.0000 mg | ORAL_TABLET | Freq: Once | ORAL | Status: AC
  Administered 2020-12-23: 50 mg via ORAL
  Filled 2020-12-23: qty 1

## 2020-12-23 NOTE — ED Triage Notes (Signed)
Headache for 2 days °

## 2020-12-23 NOTE — ED Notes (Signed)
Pt medicated per orders, Covid Swab obtained and to the lab

## 2020-12-23 NOTE — ED Provider Notes (Signed)
[facility] EMERGENCY DEPARTMENT Provider Note   CSN: [id number] Arrival date & time: 12/23/20  1327     History Chief Complaint  Patient presents with  . Headache    Shannon Wise is a 87 y.o. female.  HPI   87 year old female with past medical history of migraines presents the emergency department with headache as well as body aches and nasal congestion.  Patient states she frequently gets migraines, she takes Tylenol for relief.  However now with his nasal congestion she is having a sharper bitemporal headache over the past 2 days.  Headache has been intermittent, does self resolve, no associated neck pain or stiffness.  She took 1 dose of Tylenol this morning, the headache improved however returned and she has not taken any further medication.  She is unvaccinated.  Has been around her daughter but no known COVID/flu contacts.  Denies any vision changes, sudden worsening headache, chest pain, shortness of breath, vomiting, neuro symptoms.  Past Medical History:  Diagnosis Date  . Anxiety   . HOH (hard of hearing)   . Migraine   . Stroke (HCC)   . UTI (urinary tract infection)     Patient Active Problem List   Diagnosis Date Noted  . Screening for osteoporosis 10/12/2020  . Primary osteoarthritis of both knees 05/17/2020  . History of kidney stones 07/11/2018  . Sleep apnea 11/03/2016  . Anxiety 11/03/2016  . TMJ (temporomandibular joint disorder) 10/11/2016  . PVC (premature ventricular contraction) 03/16/2016  . Palpitations 03/16/2016  . Sensorineural hearing loss (SNHL), bilateral 01/04/2016  . Facet arthropathy 12/29/2014  . DDD (degenerative disc disease), lumbar 12/29/2014  . Cervical spine fracture (HCC) 03/25/2014    Past Surgical History:  Procedure Laterality Date  . ABDOMINAL HYSTERECTOMY    . BLADDER SURGERY       OB History   No obstetric history on file.     History reviewed. No pertinent family history.  Social History    Tobacco Use  . Smoking status: Never Smoker  . Smokeless tobacco: Never Used  Vaping Use  . Vaping Use: Never used  Substance Use Topics  . Alcohol use: No  . Drug use: No    Home Medications Prior to Admission medications   Medication Sig Start Date End Date Taking? Authorizing Provider  acetaminophen (TYLENOL) 500 MG tablet Take 2 tablets (1,000 mg total) by mouth every 6 (six) hours as needed. 05/07/20   Arby Barrette, MD  amoxicillin (AMOXIL) 500 MG tablet Take 1 tablet (500 mg total) by mouth 3 (three) times daily. 08/08/19   Gwyneth Sprout, MD  nitrofurantoin, macrocrystal-monohydrate, (MACROBID) 100 MG capsule Take 1 capsule (100 mg total) by mouth 2 (two) times daily. 09/29/20   Gwyneth Sprout, MD  phenazopyridine (PYRIDIUM) 200 MG tablet Take 1 tablet (200 mg total) by mouth 3 (three) times daily as needed for pain. 09/29/20   Gwyneth Sprout, MD  polyethylene glycol (MIRALAX) 17 g packet Take 17 g by mouth daily. 08/01/20   Milagros Loll, MD  predniSONE (DELTASONE) 5 MG tablet Take 6 pills for first day, 5 pills second day, 4 pills third day, 3 pills fourth day, 2 pills the fifth day, and 1 pill sixth day. 10/12/20   Myra Rude, MD  traMADol (ULTRAM) 50 MG tablet Take 1 tablet (50 mg total) by mouth every 6 (six) hours as needed. 05/07/20   Arby Barrette, MD  fluticasone (FLONASE) 50 MCG/ACT nasal spray 1 spray by Each Nare route  daily for 10 days. 11/07/17 06/03/19  [provider]  loratadine (CLARITIN) 10 MG tablet Take by mouth. 11/07/17 06/14/19  [provider]    Allergies    Codeine, Levaquin [levofloxacin], and Red dye  Review of Systems   Review of Systems  Constitutional: Positive for fatigue. Negative for chills and fever.  HENT: Positive for postnasal drip, rhinorrhea, sinus pressure and sinus pain. Negative for congestion, ear pain and sore throat.   Eyes: Negative for visual disturbance.  Respiratory: Negative for  shortness of breath.   Cardiovascular: Negative for chest pain.  Gastrointestinal: Negative for abdominal pain, diarrhea and vomiting.  Genitourinary: Negative for dysuria.  Musculoskeletal: Positive for myalgias.  Skin: Negative for rash.  Neurological: Positive for headaches. Negative for dizziness, facial asymmetry, speech difficulty, weakness, light-headedness and numbness.    Physical Exam Updated Vital Signs BP (!) 145/89 (BP Location: Right Arm)   Pulse 93   Temp (!) 97.3 F (36.3 C) (Oral)   Resp 18   Ht 5' 5.5" (1.664 m)   Wt 63.5 kg   SpO2 96%   BMI 22.94 kg/m   Physical Exam Vitals and nursing note reviewed.  Constitutional:      General: She is not in acute distress.    Appearance: Normal appearance. She is not ill-appearing, toxic-appearing or diaphoretic.     Comments: Very hard of hearing, speaking loudly and clearly, moving around in the bed independently and appears comfortable  HENT:     Head: Normocephalic and atraumatic.     Mouth/Throat:     Mouth: Mucous membranes are moist.  Eyes:     Extraocular Movements: Extraocular movements intact.     Right eye: No nystagmus.     Left eye: No nystagmus.     Pupils: Pupils are equal, round, and reactive to light.  Cardiovascular:     Rate and Rhythm: Normal rate.  Pulmonary:     Effort: Pulmonary effort is normal. No respiratory distress.  Abdominal:     Palpations: Abdomen is soft.  Musculoskeletal:        General: No swelling.     Cervical back: Normal range of motion. Rigidity present.  Skin:    General: Skin is warm.  Neurological:     Mental Status: She is alert and oriented to person, place, and time. Mental status is at baseline.     Cranial Nerves: No cranial nerve deficit, dysarthria or facial asymmetry.     Sensory: No sensory deficit.     Motor: No weakness.  Psychiatric:        Mood and Affect: Mood normal.     ED Results / Procedures / Treatments   Labs (all labs ordered are listed,  but only abnormal results are displayed) Labs Reviewed  RESP PANEL BY RT-PCR (FLU A&B, COVID) [id number]    EKG None  Radiology No results found.  Procedures Procedures (including critical care time)  Medications Ordered in ED Medications  traMADol (ULTRAM) tablet 50 mg (50 mg Oral Given 12/23/20 1654)    ED Course  I have reviewed the triage vital signs and the nursing notes.  Pertinent labs & imaging results that were available during my care of the patient were reviewed by me and considered in my medical decision making (see chart for details).    MDM Rules/Calculators/A&P                          87 year old female presents the  emergency department with sinus congestion, body aches and bitemporal headache.  The headache is not intractable, it is intermittent, does self resolve.  Has no associated red flags including sudden onset, worst headache of her life, neck pain/stiffness, associated neurologic symptoms.  Patient has normal heart rate and vitals on my evaluation.  She is sitting in the bed, maneuvering without difficulty, speaking loudly, appears very comfortable.  Low suspicion for an acute intracranial issue.  With the nasal congestion and body aches high suspicion for viral infection including COVID.  Patient has been tested.  She states that she has been prescribed tramadol in the past for headaches, this is listed in her medications.  She got 1 dose of tramadol and states that the headache has resolved.  She is ambulating in the room and requesting to be discharged home.  She states she can follow-up with her primary doctor, I have advised her to continue a Tylenol regimen more strictly.  With how well-appearing and concerning her complaint is I see no need for emergent imaging/labs.  Patient will be discharged and treated as an outpatient.  Discharge plan and strict return to ED precautions discussed, patient verbalizes understanding and agreement.  Final Clinical  Impression(s) / ED Diagnoses Final diagnoses:  None    Rx / DC Orders ED Discharge Orders    None       Rozelle Logan, DO 12/23/20 1739

## 2020-12-23 NOTE — ED Notes (Signed)
Pt evaluated on pain, post PO pain med. States her head feels better. AVS provided to client, pt escorted to POV, daughter here to drive pt home and be with pt

## 2020-12-23 NOTE — Discharge Instructions (Addendum)
You have been seen and discharged from the emergency department.  Follow-up with your primary provider for reevaluation. Take Tylenol every 6 hours as directed for headache control and also take home medications as prescribed.  You have been tested for COVID and flu.  They will call you if your results are positive.  Stay well-hydrated.  If you have any worsening symptoms or further concerns for health please return to an emergency department for further evaluation.

## 2020-12-23 NOTE — ED Notes (Signed)
States she has had a HA, "vice grip" feeling for the past 2 days, also body aches are present now as well. States she has been taking POs well, denies Nausea, Vomiting or Diarrhea.

## 2021-05-03 ENCOUNTER — Encounter: Payer: Self-pay | Admitting: Family Medicine

## 2021-05-03 ENCOUNTER — Ambulatory Visit (INDEPENDENT_AMBULATORY_CARE_PROVIDER_SITE_OTHER): Payer: Medicare HMO | Admitting: Family Medicine

## 2021-05-03 ENCOUNTER — Other Ambulatory Visit: Payer: Self-pay

## 2021-05-03 VITALS — BP 130/78 | HR 85 | Temp 97.8°F | Ht 64.0 in | Wt 154.0 lb

## 2021-05-03 DIAGNOSIS — K5790 Diverticulosis of intestine, part unspecified, without perforation or abscess without bleeding: Secondary | ICD-10-CM | POA: Insufficient documentation

## 2021-05-03 DIAGNOSIS — G4489 Other headache syndrome: Secondary | ICD-10-CM | POA: Diagnosis not present

## 2021-05-03 DIAGNOSIS — H903 Sensorineural hearing loss, bilateral: Secondary | ICD-10-CM | POA: Diagnosis not present

## 2021-05-03 DIAGNOSIS — M170 Bilateral primary osteoarthritis of knee: Secondary | ICD-10-CM | POA: Diagnosis not present

## 2021-05-03 DIAGNOSIS — M79622 Pain in left upper arm: Secondary | ICD-10-CM

## 2021-05-03 DIAGNOSIS — H259 Unspecified age-related cataract: Secondary | ICD-10-CM

## 2021-05-03 DIAGNOSIS — I700 Atherosclerosis of aorta: Secondary | ICD-10-CM | POA: Insufficient documentation

## 2021-05-03 DIAGNOSIS — I679 Cerebrovascular disease, unspecified: Secondary | ICD-10-CM | POA: Diagnosis not present

## 2021-05-03 DIAGNOSIS — H269 Unspecified cataract: Secondary | ICD-10-CM | POA: Insufficient documentation

## 2021-05-03 MED ORDER — ZOLMITRIPTAN 2.5 MG PO TABS: 2.5000 mg | ORAL_TABLET | Freq: Once | ORAL | 0 refills | Status: DC

## 2021-05-03 NOTE — Patient Instructions (Signed)
Use Volatren (ketoralac) gel on knees twice a day.
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
## 2021-06-05 IMAGING — CT CT ABDOMEN AND PELVIS WITH CONTRAST
2 of 5 series · 16 of 46 positions shown, 18 images · IV contrast (APPLIED)
Comparison: By report from 11/03/2018

CLINICAL DATA: Acute abdominal pain and dysuria

EXAM:
CT ABDOMEN AND PELVIS WITH CONTRAST
TECHNIQUE: Multidetector CT imaging of the abdomen and pelvis was performed
using the standard protocol following bolus administration of
intravenous contrast.
CONTRAST:  100mL OMNIPAQUE IOHEXOL 300 MG/ML  SOLN

[Series 2: axial st · axial · 0.88mm/px · z∈[-437,-32]mm · 13 of 91 slices shown, 15 images]
[im 5/91  soft-tissue]
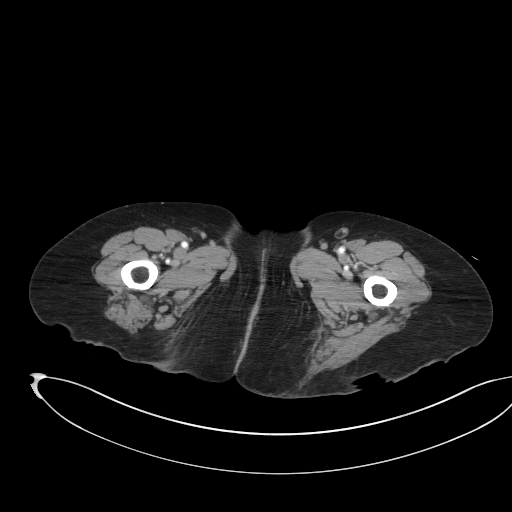
[im 5/91  bone]
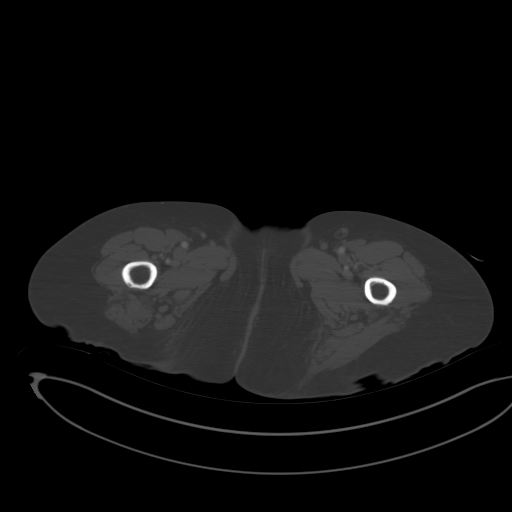
[im 15/91  soft-tissue]
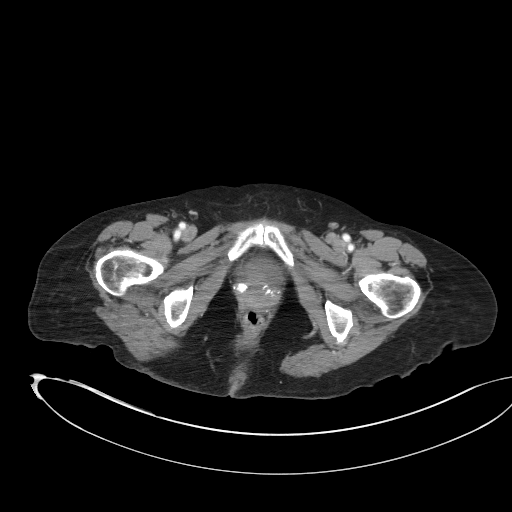
[im 19/91  soft-tissue]
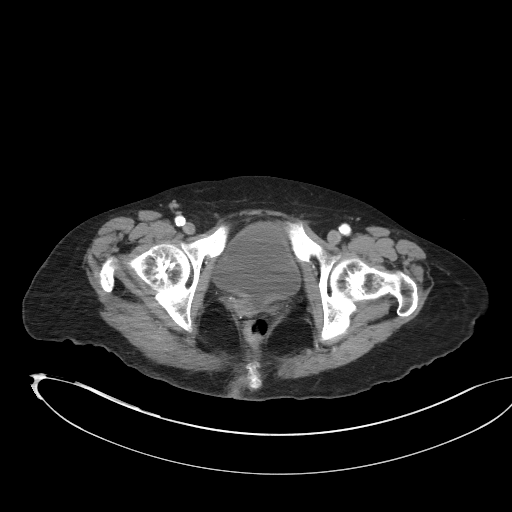
[im 24/91  soft-tissue]
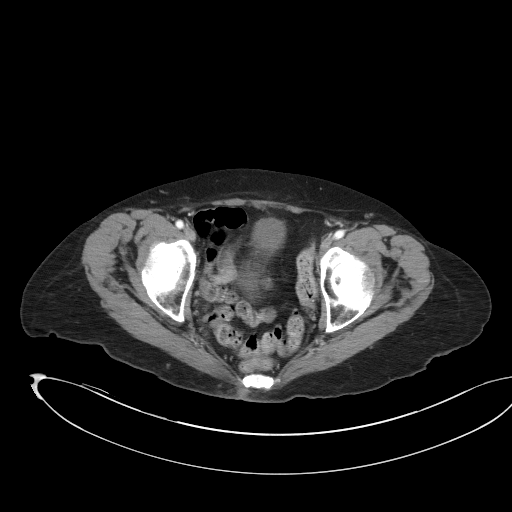
[im 34/91  soft-tissue]
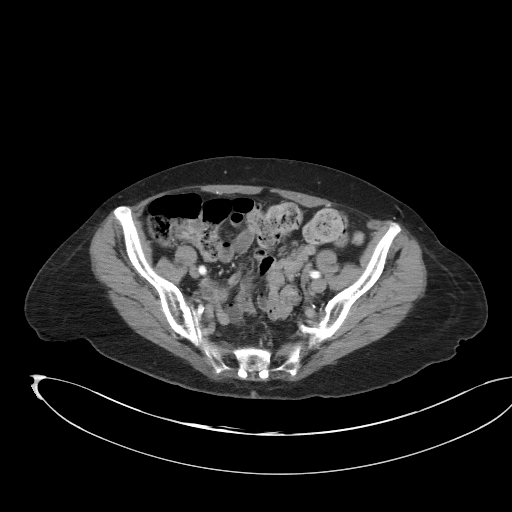
[im 38/91  soft-tissue]
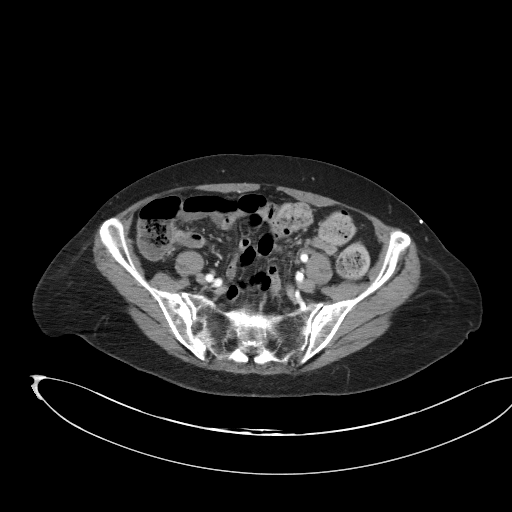
[im 48/91  soft-tissue]
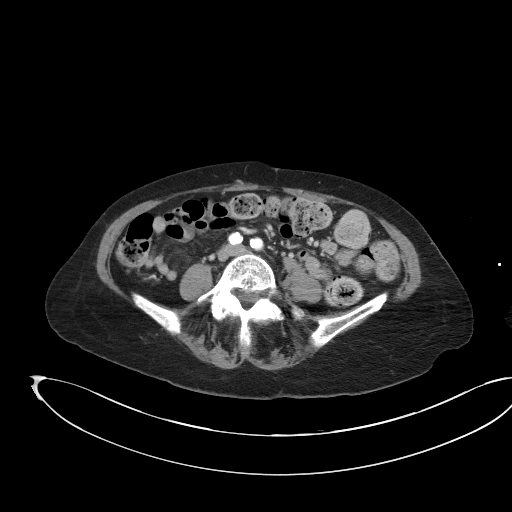
[im 53/91  soft-tissue]
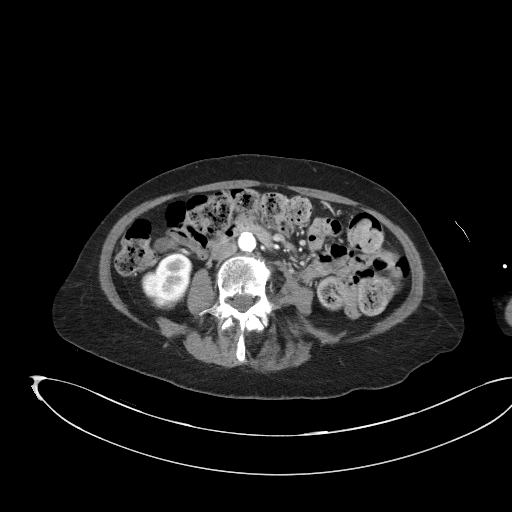
[im 57/91  soft-tissue]
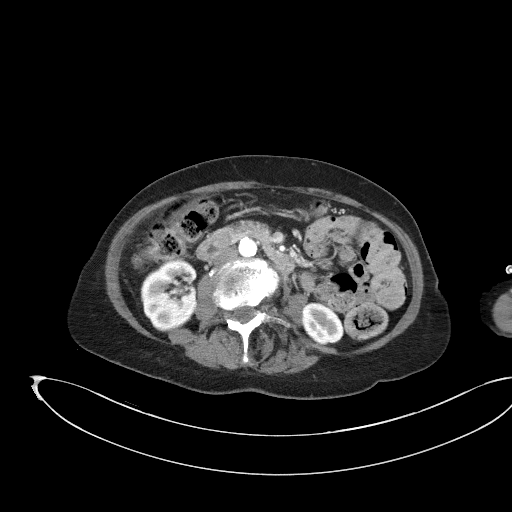
[im 57/91  bone]
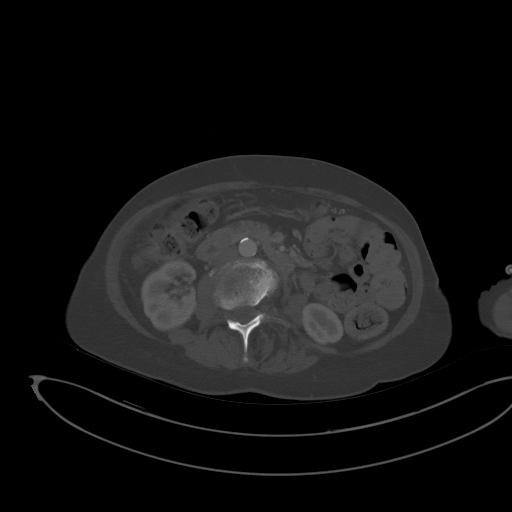
[im 67/91  soft-tissue]
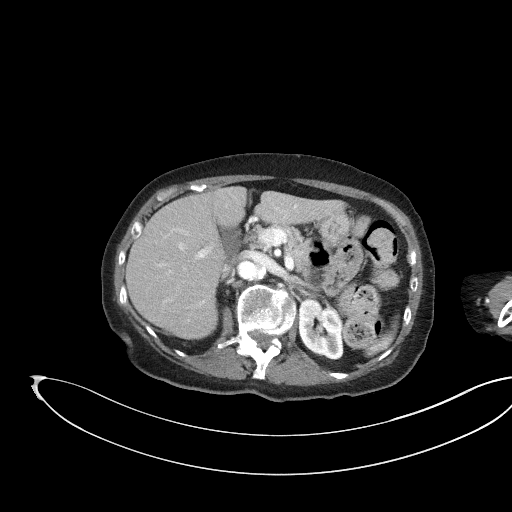
[im 72/91  soft-tissue]
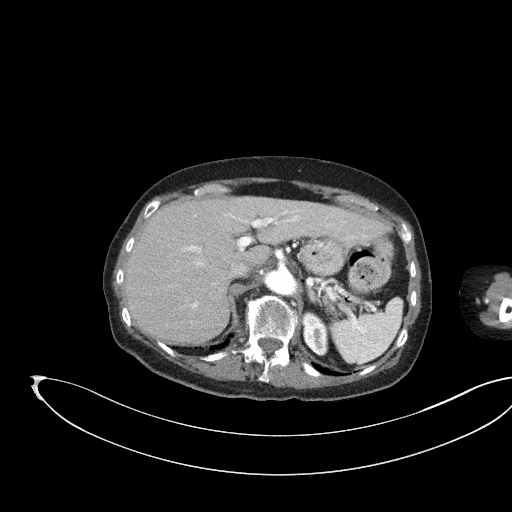
[im 76/91  soft-tissue]
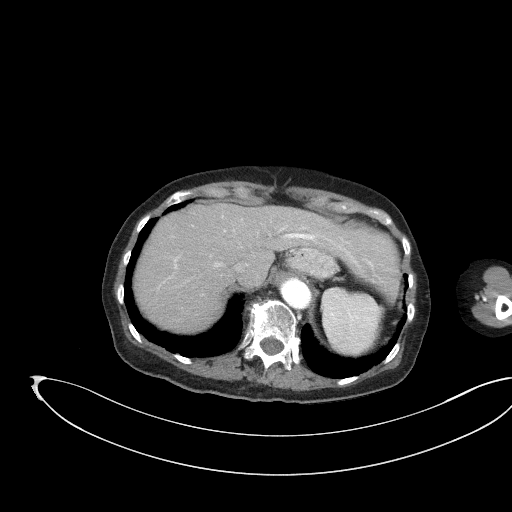
[im 86/91  soft-tissue]
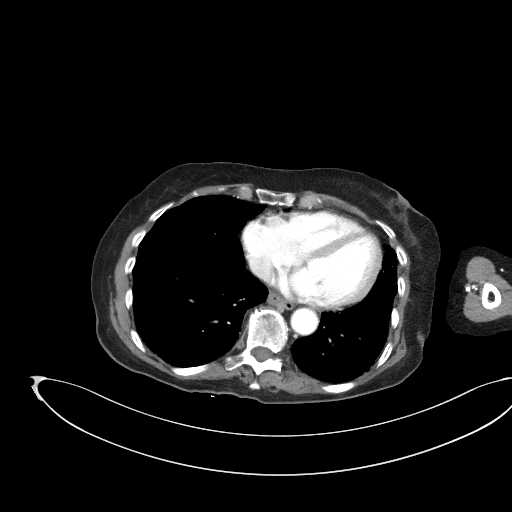

[Series 5: coronal st · coronal · 0.80mm/px · 3 of 77 slices shown]
[im 26/77  soft-tissue]
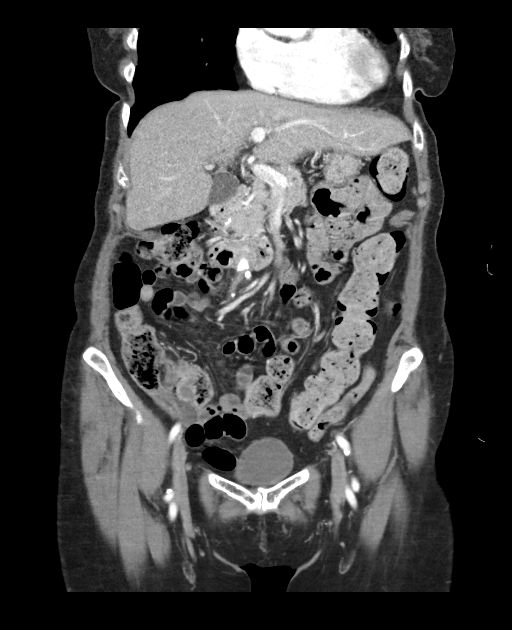
[im 34/77  soft-tissue]
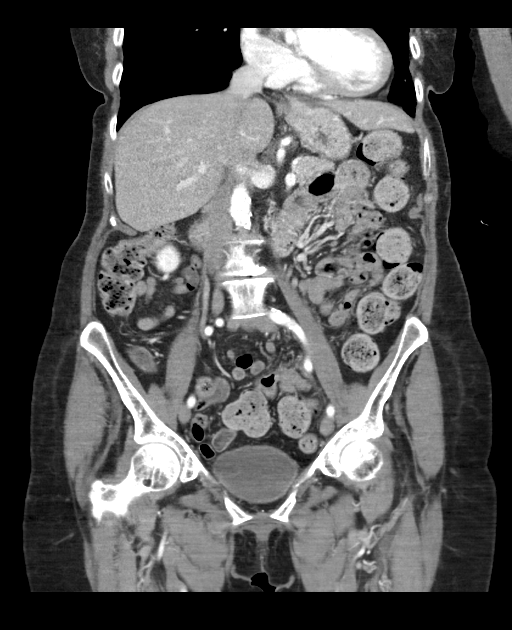
[im 43/77  soft-tissue]
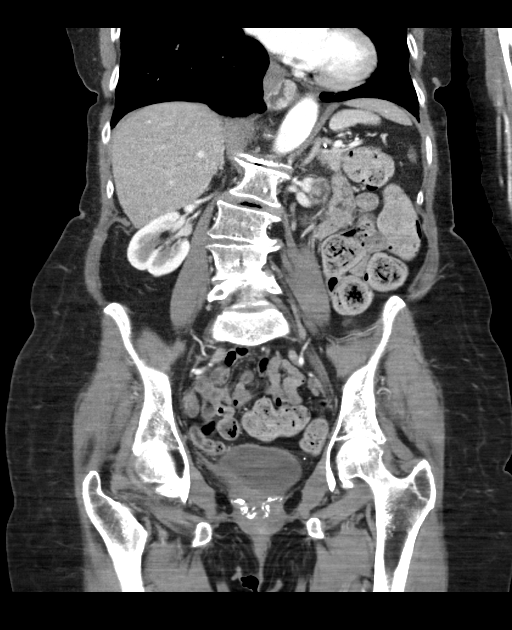

[16 of 46 positions shown; findings below may reference images not displayed]

FINDINGS: Lower chest: Lung bases are well aerated. Some minimal patchy
atelectatic changes are noted in the bases.

Hepatobiliary: No focal liver abnormality is seen. No gallstones,
gallbladder wall thickening, or biliary dilatation.

Pancreas: Unremarkable. No pancreatic ductal dilatation or
surrounding inflammatory changes.

Spleen: Normal in size without focal abnormality.

Adrenals/Urinary Tract: Adrenal glands are within normal limits.
Kidneys are well visualized bilaterally. No renal calculi or
obstructive changes are noted. Normal excretion of contrast is seen.
Ovoid cyst is noted in the midportion of the right kidney. The
bladder is partially distended.

Stomach/Bowel: Scattered diverticular change of the colon is noted.
Mild retained fecal material is noted within the colon although no
obstructive changes are seen. The appendix is not well visualized
although no inflammatory changes are seen to suggest appendicitis.
The small bowel and stomach are within normal limits with the
exception of a sliding-type hiatal hernia.

Vascular/Lymphatic: Aortic atherosclerosis. No enlarged abdominal or
pelvic lymph nodes.

Reproductive: Status post hysterectomy. No adnexal masses.

Other: No abdominal wall hernia or abnormality. No abdominopelvic
ascites.

Musculoskeletal: Degenerative changes of lumbar spine are seen. No
acute bony abnormality is noted.
IMPRESSION: Diverticulosis without diverticulitis.

Right renal cyst stable from the prior exam.

No acute abnormality noted.

## 2021-07-03 ENCOUNTER — Ambulatory Visit (INDEPENDENT_AMBULATORY_CARE_PROVIDER_SITE_OTHER): Payer: Medicare HMO | Admitting: Family Medicine

## 2021-07-03 ENCOUNTER — Encounter: Payer: Self-pay | Admitting: Family Medicine

## 2021-07-03 ENCOUNTER — Other Ambulatory Visit: Payer: Self-pay

## 2021-07-03 VITALS — BP 124/80 | HR 76 | Temp 97.2°F | Ht 64.0 in | Wt 152.2 lb

## 2021-07-03 DIAGNOSIS — G4489 Other headache syndrome: Secondary | ICD-10-CM | POA: Diagnosis not present

## 2021-07-03 MED ORDER — ZOLMITRIPTAN 2.5 MG PO TABS: 2.5000 mg | ORAL_TABLET | Freq: Once | ORAL | 3 refills | Status: DC

## 2021-07-03 NOTE — Progress Notes (Signed)
[facility] [address] [place] [address] Dept: [phone] Dept Fax: [phone]  Office Visit  Subjective:    Patient ID: Shannon Wise, female    DOB: Mar 11, 1934, 87 y.o..   MRN: [id number]  Chief Complaint  Patient presents with   Follow-up    2 month f/u HA's.       History of Present Illness:  Patient is in today for reassessment of her headaches. Ms. Noack has a history of chronic mixed headaches. At her last visit, I gave her a trial of Zomig to see if this will help. She reports that she has no headache today. She does feel that Zomig will stop the headache. She notes she gets these about every 2-3 days.  Ms. Nicks notes she has felt lightheaded today. Her son-in-law, Fredrik Cove, notes that Ms. Jacques has not eaten today, though she told him she had a sausage biscuit earlier (he was not able to confirm this with his wife). She does drink fluids regularly.  Past Medical History: Patient Active Problem List   Diagnosis Date Noted   Left upper arm pain 05/03/2021   Cerebrovascular disease 05/03/2021   Diverticulosis 05/03/2021   Aortic atherosclerosis (HCC) 05/03/2021   Cataracts, bilateral 05/03/2021   Perennial allergic rhinitis with seasonal variation 06/04/2020   Chronic UTI (urinary tract infection) 06/04/2020   Chronic mixed headache syndrome 06/04/2020   Primary osteoarthritis of both knees 05/17/2020   History of kidney stones 07/11/2018   Sleep apnea 11/03/2016   Generalized anxiety disorder 11/03/2016   TMJ (temporomandibular joint disorder) 10/11/2016   PVC (premature ventricular contraction) 03/16/2016   Palpitations 03/16/2016   Sensorineural hearing loss (SNHL), bilateral 01/04/2016   Facet arthropathy 12/29/2014   DDD (degenerative disc disease), lumbar 12/29/2014   Cervical spine fracture (HCC) 03/25/2014   Past Surgical History:  Procedure Laterality Date   ABDOMINAL HYSTERECTOMY      BLADDER SURGERY     No family history on file.  Outpatient Medications Prior to Visit  Medication Sig Dispense Refill   acetaminophen (TYLENOL) 500 MG tablet Take 2 tablets (1,000 mg total) by mouth every 6 (six) hours as needed. 30 tablet 0   ZOLMitriptan (ZOMIG) 2.5 MG tablet Take 1 tablet (2.5 mg total) by mouth once for 1 dose. May repeat in 2 hours if headache persists or recurs. 10 tablet 0   No facility-administered medications prior to visit.   Allergies  Allergen Reactions   Codeine    Levaquin [Levofloxacin] Other (See Comments)    Abdominal pain   Red Dye     Objective:   Today's Vitals   07/03/21 1302  BP: 124/80  Pulse: 76  Temp: (!) 97.2 F (36.2 C)  TempSrc: Temporal  SpO2: 94%  Weight: 152 lb 3.2 oz (69 kg)  Height: 5\' 4"  (1.626 m)   Body mass index is 26.13 kg/m.   General: Well developed, well nourished. No acute distress. CV: RRR without murmurs or rubs. Pulses 2+ bilaterally. Psych: Alert and oriented. Normal mood and affect.  Health Maintenance Due  Topic Date Due   COVID-19 Vaccine (1) Never done   Zoster Vaccines- Shingrix (1 of 2) Never done     Assessment & Plan:   1. Chronic mixed headache syndrome Ms. Schofield appears to have responded to the Zomig. I reviewed a report of an UC visit for a headache that she had about a month ago. She was provided some Zanaflex and tramadol at that  visit, but is apparently not taking these now. I will plan to continue her on Zomig. I will reassess her in 4 months. Fredrik Cove will help her manage this PRN, so she doesn't end up taking this daily.  - ZOLMitriptan (ZOMIG) 2.5 MG tablet; Take 1 tablet (2.5 mg total) by mouth once for 1 dose. May repeat in 2 hours if headache persists or recurs.  Dispense: 12 tablet; Refill: 3  Loyola Mast, MD

## 2021-07-11 IMAGING — DX CHEST - 2 VIEW
2 series · 2 of 2 positions shown · non-contrast
Comparison: January 02, 2019

CLINICAL DATA: Chest pain

EXAM:
CHEST - 2 VIEW

[chest lat]
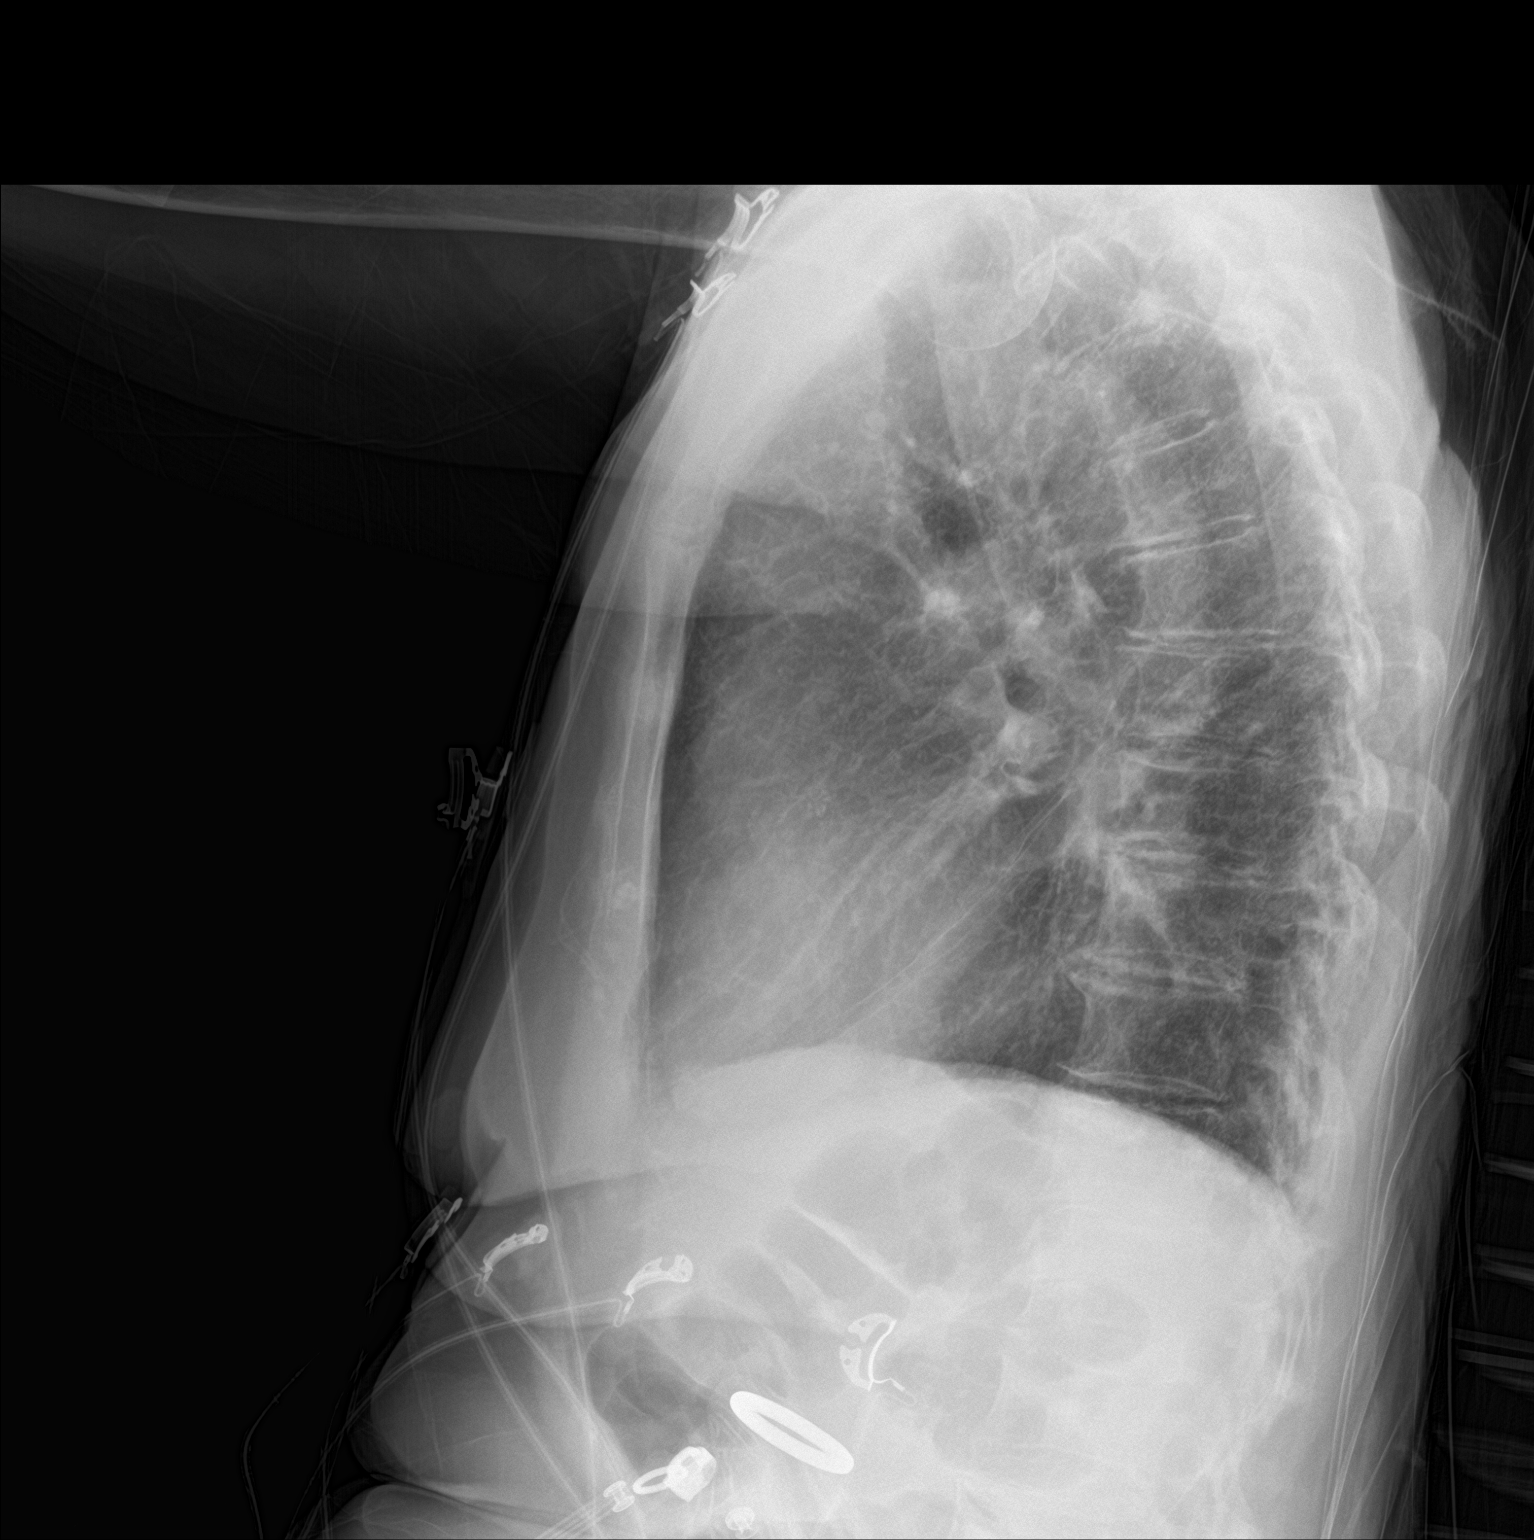

[chest ap strecther]
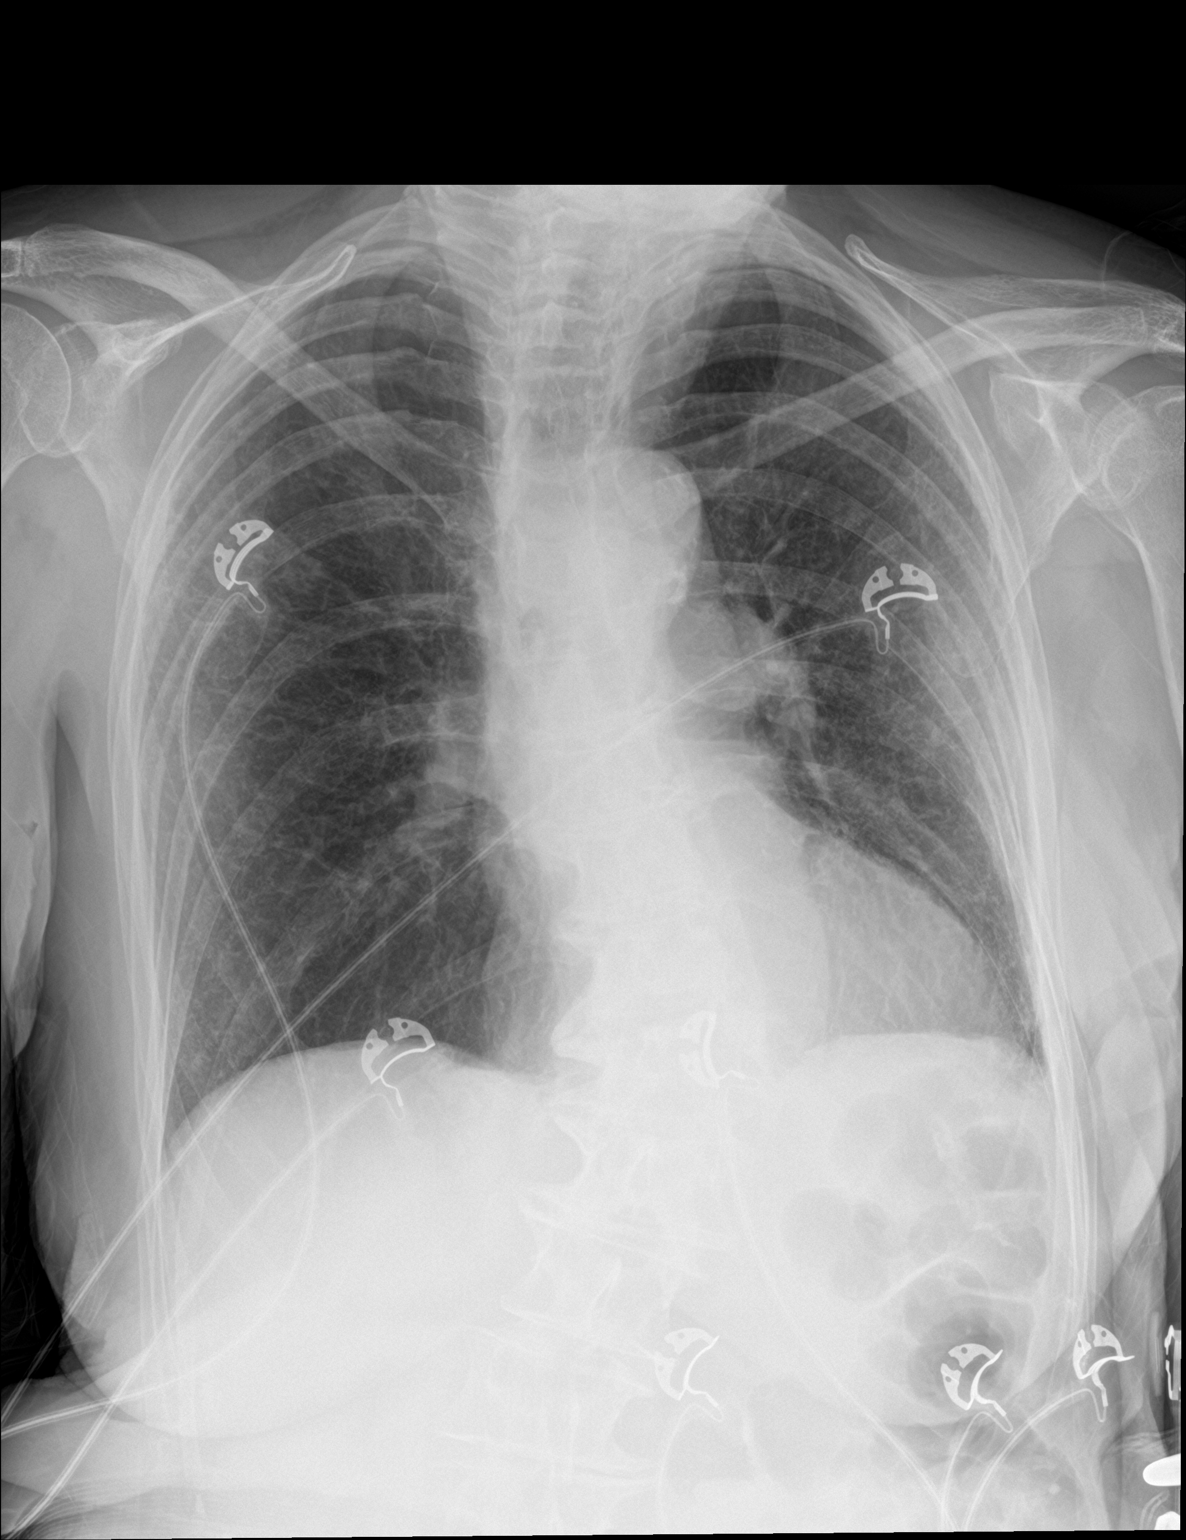

[2 of 2 positions shown; findings below may reference images not displayed]

FINDINGS: Heart size is stable. Aortic calcifications are noted. There is no
focal infiltrate. No pneumothorax. No definite radiographic evidence
for pneumomediastinum. There is no displaced fracture. There is
scarring and atelectasis at the lung bases. The lungs are
hyperexpanded which can be seen in patients with COPD. There is a
levoscoliosis centered at the lower thoracic spine. There are
degenerative changes throughout the visualized thoracic spine.
IMPRESSION: No active cardiopulmonary disease.

## 2021-08-18 ENCOUNTER — Telehealth: Payer: Self-pay

## 2021-08-18 NOTE — Telephone Encounter (Signed)
lft VM to rtn call to schedule an AWV.  Dm/cma

## 2021-09-07 ENCOUNTER — Ambulatory Visit (INDEPENDENT_AMBULATORY_CARE_PROVIDER_SITE_OTHER): Payer: Medicare HMO | Admitting: *Deleted

## 2021-09-07 DIAGNOSIS — Z0000 Encounter for general adult medical examination without abnormal findings: Secondary | ICD-10-CM

## 2021-09-07 NOTE — Patient Instructions (Signed)
Shannon Wise , Thank you for taking time to come for your Medicare Wellness Visit. I appreciate your ongoing commitment to your health goals. Please review the following plan we discussed and let me know if I can assist you in the future.   Screening recommendations/referrals: Colonoscopy: no longer required Mammogram:  no longer required Bone Density: up to date Recommended yearly ophthalmology/optometry visit for glaucoma screening and checkup Recommended yearly dental visit for hygiene and checkup  Vaccinations: Influenza vaccine: Education provided Pneumococcal vaccine: up to date Tdap vaccine: Education provided Shingles vaccine: Education provided    Advanced directives: Education provided  Conditions/risks identified:   Next appointment: 11-06-2021 @ 1:30 Dr. Veto Kemps   Preventive Care 65 Years and Older, Female Preventive care refers to lifestyle choices and visits with your health care provider that can promote health and wellness. What does preventive care include? A yearly physical exam. This is also called an annual well check. Dental exams once or twice a year. Routine eye exams. Ask your health care provider how often you should have your eyes checked. Personal lifestyle choices, including: Daily care of your teeth and gums. Regular physical activity. Eating a healthy diet. Avoiding tobacco and drug use. Limiting alcohol use. Practicing safe sex. Taking low-dose aspirin every day. Taking vitamin and mineral supplements as recommended by your health care provider. What happens during an annual well check? The services and screenings done by your health care provider during your annual well check will depend on your age, overall health, lifestyle risk factors, and family history of disease. Counseling  Your health care provider may ask you questions about your: Alcohol use. Tobacco use. Drug use. Emotional well-being. Home and relationship well-being. Sexual  activity. Eating habits. History of falls. Memory and ability to understand (cognition). Work and work Astronomer. Reproductive health. Screening  You may have the following tests or measurements: Height, weight, and BMI. Blood pressure. Lipid and cholesterol levels. These may be checked every 5 years, or more frequently if you are over 78 years old. Skin check. Lung cancer screening. You may have this screening every year starting at age 56 if you have a 30-pack-year history of smoking and currently smoke or have quit within the past 15 years. Fecal occult blood test (FOBT) of the stool. You may have this test every year starting at age 37. Flexible sigmoidoscopy or colonoscopy. You may have a sigmoidoscopy every 5 years or a colonoscopy every 10 years starting at age 73. Hepatitis C blood test. Hepatitis B blood test. Sexually transmitted disease (STD) testing. Diabetes screening. This is done by checking your blood sugar (glucose) after you have not eaten for a while (fasting). You may have this done every 1-3 years. Bone density scan. This is done to screen for osteoporosis. You may have this done starting at age 81. Mammogram. This may be done every 1-2 years. Talk to your health care provider about how often you should have regular mammograms. Talk with your health care provider about your test results, treatment options, and if necessary, the need for more tests. Vaccines  Your health care provider may recommend certain vaccines, such as: Influenza vaccine. This is recommended every year. Tetanus, diphtheria, and acellular pertussis (Tdap, Td) vaccine. You may need a Td booster every 10 years. Zoster vaccine. You may need this after age 44. Pneumococcal 13-valent conjugate (PCV13) vaccine. One dose is recommended after age 51. Pneumococcal polysaccharide (PPSV23) vaccine. One dose is recommended after age 44. Talk to your health care provider  about which screenings and vaccines  you need and how often you need them. This information is not intended to replace advice given to you by your health care provider. Make sure you discuss any questions you have with your health care provider. Document Released: 12/23/2015 Document Revised: 08/15/2016 Document Reviewed: 09/27/2015 Elsevier Interactive Patient Education  2017 [place] Prevention in the Home Falls can cause injuries. They can happen to people of all ages. There are many things you can do to make your home safe and to help prevent falls. What can I do on the outside of my home? Regularly fix the edges of walkways and driveways and fix any cracks. Remove anything that might make you trip as you walk through a door, such as a raised step or threshold. Trim any bushes or trees on the path to your home. Use bright outdoor lighting. Clear any walking paths of anything that might make someone trip, such as rocks or tools. Regularly check to see if handrails are loose or broken. Make sure that both sides of any steps have handrails. Any raised decks and porches should have guardrails on the edges. Have any leaves, snow, or ice cleared regularly. Use sand or salt on walking paths during winter. Clean up any spills in your garage right away. This includes oil or grease spills. What can I do in the bathroom? Use night lights. Install grab bars by the toilet and in the tub and shower. Do not use towel bars as grab bars. Use non-skid mats or decals in the tub or shower. If you need to sit down in the shower, use a plastic, non-slip stool. Keep the floor dry. Clean up any water that spills on the floor as soon as it happens. Remove soap buildup in the tub or shower regularly. Attach bath mats securely with double-sided non-slip rug tape. Do not have throw rugs and other things on the floor that can make you trip. What can I do in the bedroom? Use night lights. Make sure that you have a light by your bed that  is easy to reach. Do not use any sheets or blankets that are too big for your bed. They should not hang down onto the floor. Have a firm chair that has side arms. You can use this for support while you get dressed. Do not have throw rugs and other things on the floor that can make you trip. What can I do in the kitchen? Clean up any spills right away. Avoid walking on wet floors. Keep items that you use a lot in easy-to-reach places. If you need to reach something above you, use a strong step stool that has a grab bar. Keep electrical cords out of the way. Do not use floor polish or wax that makes floors slippery. If you must use wax, use non-skid floor wax. Do not have throw rugs and other things on the floor that can make you trip. What can I do with my stairs? Do not leave any items on the stairs. Make sure that there are handrails on both sides of the stairs and use them. Fix handrails that are broken or loose. Make sure that handrails are as long as the stairways. Check any carpeting to make sure that it is firmly attached to the stairs. Fix any carpet that is loose or worn. Avoid having throw rugs at the top or bottom of the stairs. If you do have throw rugs, attach them to the floor  with carpet tape. Make sure that you have a light switch at the top of the stairs and the bottom of the stairs. If you do not have them, ask someone to add them for you. What else can I do to help prevent falls? Wear shoes that: Do not have high heels. Have rubber bottoms. Are comfortable and fit you well. Are closed at the toe. Do not wear sandals. If you use a stepladder: Make sure that it is fully opened. Do not climb a closed stepladder. Make sure that both sides of the stepladder are locked into place. Ask someone to hold it for you, if possible. Clearly mark and make sure that you can see: Any grab bars or handrails. First and last steps. Where the edge of each step is. Use tools that help you  move around (mobility aids) if they are needed. These include: Canes. Walkers. Scooters. Crutches. Turn on the lights when you go into a dark area. Replace any light bulbs as soon as they burn out. Set up your furniture so you have a clear path. Avoid moving your furniture around. If any of your floors are uneven, fix them. If there are any pets around you, be aware of where they are. Review your medicines with your doctor. Some medicines can make you feel dizzy. This can increase your chance of falling. Ask your doctor what other things that you can do to help prevent falls. This information is not intended to replace advice given to you by your health care provider. Make sure you discuss any questions you have with your health care provider. Document Released: 09/22/2009 Document Revised: 05/03/2016 Document Reviewed: 12/31/2014 Elsevier Interactive Patient Education  2017 [employer].

## 2021-09-07 NOTE — Progress Notes (Signed)
Subjective:    I connected with  Shannon Wise on 09/07/21 by a telephone enabled telemedicine application and verified that I am speaking with the correct person using two identifiers.   I discussed the limitations of evaluation and management by telemedicine. The patient expressed understanding and agreed to proceed.     Review of Systems     Cardiac Risk Factors include: advanced age (>84men, >63 women);sedentary lifestyle     Objective:    Today's Vitals   There is no height or weight on file to calculate BMI.  Advanced Directives 09/07/2021 12/23/2020 08/01/2020 05/15/2020 05/07/2020 09/02/2019 08/29/2019  Does Patient Have a Medical Advance Directive? No No No No No No No  Would patient like information on creating a medical advance directive? No - Patient declined - No - Patient declined - No - Patient declined No - Patient declined -    Current Medications (verified) Outpatient Encounter Medications as of 09/07/2021  Medication Sig   acetaminophen (TYLENOL) 500 MG tablet Take 2 tablets (1,000 mg total) by mouth every 6 (six) hours as needed.   ZOLMitriptan (ZOMIG) 2.5 MG tablet Take 1 tablet (2.5 mg total) by mouth once for 1 dose. May repeat in 2 hours if headache persists or recurs.   [DISCONTINUED] fluticasone (FLONASE) 50 MCG/ACT nasal spray 1 spray by Each Nare route daily for 10 days.   [DISCONTINUED] loratadine (CLARITIN) 10 MG tablet Take by mouth.   No facility-administered encounter medications on file as of 09/07/2021.    Allergies (verified) Codeine, Levaquin [levofloxacin], and Red dye   History: Past Medical History:  Diagnosis Date   Anxiety    HOH (hard of hearing)    Migraine    Stroke (HCC)    UTI (urinary tract infection)    Past Surgical History:  Procedure Laterality Date   ABDOMINAL HYSTERECTOMY     BLADDER SURGERY     No family history on file. Social History   Socioeconomic History   Marital status: Divorced    Spouse name: Not on  file   Number of children: Not on file   Years of education: Not on file   Highest education level: Not on file  Occupational History   Not on file  Tobacco Use   Smoking status: Never   Smokeless tobacco: Former  Building services engineer Use: Never used  Substance and Sexual Activity   Alcohol use: No   Drug use: No   Sexual activity: Not Currently  Other Topics Concern   Not on file  Social History Narrative   Not on file   Social Determinants of Health   Financial Resource Strain: Low Risk    Difficulty of Paying Living Expenses: Not hard at all  Food Insecurity: No Food Insecurity   Worried About Programme researcher, broadcasting/film/video in the Last Year: Never true   Barista in the Last Year: Never true  Transportation Needs: No Transportation Needs   Lack of Transportation (Medical): No   Lack of Transportation (Non-Medical): No  Physical Activity: Inactive   Days of Exercise per Week: 0 days   Minutes of Exercise per Session: 0 min  Stress: No Stress Concern Present   Feeling of Stress : Not at all  Social Connections: Socially Isolated   Frequency of Communication with Friends and Family: Three times a week   Frequency of Social Gatherings with Friends and Family: Three times a week   Attends Religious Services: Never  Active Member of Clubs or Organizations: No   Attends Banker Meetings: Never   Marital Status: Widowed    Tobacco Counseling Counseling given: Not Answered   Clinical Intake:  Pre-visit preparation completed: Yes  Pain : No/denies pain     Nutritional Risks: None Diabetes: No  How often do you need to have someone help you when you read instructions, pamphlets, or other written materials from your doctor or pharmacy?: 1 - Never  Diabetic?no  Interpreter Needed?: No  Information entered by :: Remi Haggard LPN   Activities of Daily Living In your present state of health, do you have any difficulty performing the following  activities: 09/07/2021  Hearing? Y  Vision? N  Difficulty concentrating or making decisions? N  Walking or climbing stairs? Y  Dressing or bathing? N  Doing errands, shopping? Y  Preparing Food and eating ? N  Using the Toilet? N  In the past six months, have you accidently leaked urine? Y  Do you have problems with loss of bowel control? N  Managing your Medications? N  Managing your Finances? N  Housekeeping or managing your Housekeeping? N  Some recent data might be hidden    Patient Care Team: Loyola Mast, MD as PCP - General (Family Medicine) Myra Rude, MD as Consulting Physician (Sports Medicine)  Indicate any recent Medical Services you may have received from other than [facility] providers in the past year (date may be approximate).     Assessment:   This is a routine wellness examination for [place].  Hearing/Vision screen Hearing Screening - Comments:: Will not wear hearing aids Vision Screening - Comments:: Not up to date  Dietary issues and exercise activities discussed: Current Exercise Habits: The patient does not participate in regular exercise at present, Exercise limited by: None identified   Goals Addressed             This Visit's Progress    Patient Stated       Would like to continue current lifestyle       Depression Screen PHQ 2/9 Scores 09/07/2021  PHQ - 2 Score 0    Fall Risk Fall Risk  09/07/2021 05/03/2021  Falls in the past year? 0 0  Number falls in past yr: 0 0  Injury with Fall? 0 0  Follow up Falls evaluation completed;Falls prevention discussed -    FALL RISK PREVENTION PERTAINING TO THE HOME:  Any stairs in or around the home? No  If so, are there any without handrails? No  Home free of loose throw rugs in walkways, pet beds, electrical cords, etc? Yes  Adequate lighting in your home to reduce risk of falls? Yes   ASSISTIVE DEVICES UTILIZED TO PREVENT FALLS:  Life alert? No  Use of a cane, walker or w/c? Yes   Grab bars in the bathroom? Yes  Shower chair or bench in shower? Yes  Elevated toilet seat or a handicapped toilet? Yes   TIMED UP AND GO:  Was the test performed? No .    Cognitive Function:  Normal cognitive status assessed by direct observation by this Nurse Health Advisor. No abnormalities found.          Immunizations Immunization History  Administered Date(s) Administered   Pneumococcal Conjugate-13 10/03/2016   Pneumococcal Polysaccharide-23 04/18/2013, 10/24/2013, 03/23/2014   Tdap 04/18/2013   Tetanus 04/18/2013    TDAP status: Due, Education has been provided regarding the importance of this vaccine. Advised may receive this vaccine  at local pharmacy or Health Dept. Aware to provide a copy of the vaccination record if obtained from local pharmacy or Health Dept. Verbalized acceptance and understanding.  Flu Vaccine status: Due, Education has been provided regarding the importance of this vaccine. Advised may receive this vaccine at local pharmacy or Health Dept. Aware to provide a copy of the vaccination record if obtained from local pharmacy or Health Dept. Verbalized acceptance and understanding.  Pneumococcal vaccine status: Up to date  Covid-19 vaccine status: Information provided on how to obtain vaccines.   Qualifies for Shingles Vaccine? Yes   Zostavax completed No   Shingrix Completed?: No.    Education has been provided regarding the importance of this vaccine. Patient has been advised to call insurance company to determine out of pocket expense if they have not yet received this vaccine. Advised may also receive vaccine at local pharmacy or Health Dept. Verbalized acceptance and understanding.  Screening Tests Health Maintenance  Topic Date Due   COVID-19 Vaccine (1) Never done   Zoster Vaccines- Shingrix (1 of 2) Never done   INFLUENZA VACCINE  07/10/2021   TETANUS/TDAP  04/19/2023   DEXA SCAN  Completed   HPV VACCINES  Aged Out    Health  Maintenance  Health Maintenance Due  Topic Date Due   COVID-19 Vaccine (1) Never done   Zoster Vaccines- Shingrix (1 of 2) Never done   INFLUENZA VACCINE  07/10/2021    Colorectal cancer screening: No longer required.   Mammogram status: No longer required due to  .  Bone Density status: Completed 2021. Results reflect: Bone density results: OSTEOPOROSIS. Repeat every 2 years.  Lung Cancer Screening: (Low Dose CT Chest recommended if Age 88-80 years, 30 pack-year currently smoking OR have quit w/in 15years.) does not qualify.   Lung Cancer Screening Referral:   Additional Screening:  Hepatitis C Screening: does not qualify; C  Vision Screening: Recommended annual ophthalmology exams for early detection of glaucoma and other disorders of the eye. Is the patient up to date with their annual eye exam?  No  Who is the provider or what is the name of the office in which the patient attends annual eye exams? Will establish at [facility] If pt is not established with a provider, would they like to be referred to a provider to establish care? No .   Dental Screening: Recommended annual dental exams for proper oral hygiene  Community Resource Referral / Chronic Care Management: CRR required this visit?  No   CCM required this visit?  No      Plan:     I have personally reviewed and noted the following in the patient's chart:   Medical and social history Use of alcohol, tobacco or illicit drugs  Current medications and supplements including opioid prescriptions.  Functional ability and status Nutritional status Physical activity Advanced directives List of other physicians Hospitalizations, surgeries, and ER visits in previous 12 months Vitals Screenings to include cognitive, depression, and falls Referrals and appointments  In addition, I have reviewed and discussed with patient certain preventive protocols, quality metrics, and best practice recommendations. A written  personalized care plan for preventive services as well as general preventive health recommendations were provided to patient.     Remi Haggard, LPN   7/84/6962   Nurse Notes: Patient son-in-law was in on call Ok'd with patient

## 2021-11-06 ENCOUNTER — Ambulatory Visit (INDEPENDENT_AMBULATORY_CARE_PROVIDER_SITE_OTHER): Payer: Medicare HMO | Admitting: Family Medicine

## 2021-11-06 ENCOUNTER — Encounter: Payer: Self-pay | Admitting: Family Medicine

## 2021-11-06 ENCOUNTER — Other Ambulatory Visit: Payer: Self-pay

## 2021-11-06 ENCOUNTER — Telehealth: Payer: Self-pay

## 2021-11-06 VITALS — BP 118/76 | HR 74 | Temp 97.1°F | Ht 64.0 in | Wt 153.6 lb

## 2021-11-06 DIAGNOSIS — G4489 Other headache syndrome: Secondary | ICD-10-CM | POA: Diagnosis not present

## 2021-11-06 DIAGNOSIS — Z23 Encounter for immunization: Secondary | ICD-10-CM | POA: Diagnosis not present

## 2021-11-06 DIAGNOSIS — I679 Cerebrovascular disease, unspecified: Secondary | ICD-10-CM | POA: Diagnosis not present

## 2021-11-06 DIAGNOSIS — M170 Bilateral primary osteoarthritis of knee: Secondary | ICD-10-CM

## 2021-11-06 MED ORDER — ZOLMITRIPTAN 2.5 MG PO TABS
2.5000 mg | ORAL_TABLET | Freq: Once | ORAL | 3 refills | Status: DC
Start: 2021-11-06 — End: 2022-04-22

## 2021-11-06 MED ORDER — AMITRIPTYLINE HCL 10 MG PO TABS: 10.0000 mg | ORAL_TABLET | Freq: Every day | ORAL | 3 refills | Status: DC

## 2021-11-06 NOTE — Telephone Encounter (Signed)
PA for Amitriptyline 10 mg submitted through cover my meds to [employer].  Awaiting response. Dm/cma   Key: [id number]

## 2021-11-06 NOTE — Progress Notes (Signed)
[facility] [address] [place] [address] Dept: [phone] Dept Fax: [phone]  Chronic Care Office Visit  Subjective:    Patient ID: Shannon Wise, female    DOB: 12/05/1934, 87 y.o..   MRN: [id number]  Chief Complaint  Patient presents with   Follow-up    4 month f/u.  Still having HA's, wants flu shot today.      History of Present Illness:  Patient is in today for reassessment of chronic medical issues.  Shannon Wise has a history of chronic mixed headaches. She notes that more recently, she has been having daily headaches. She feels her Zomig helps, but then she doesn't have enough to take every day.    Shannon Wise has a history of osteoarthritis of her knees. She states they hurt her all the time. She says she has nothing to take for this. Her son-in-law notes he had gotten Shannon Wise some Biofreeze to use and that she did note some comfort with this, but that she doesn't like the smell and doesn't use it very often.  Past Medical History: Patient Active Problem List   Diagnosis Date Noted   Left upper arm pain 05/03/2021   Cerebrovascular disease 05/03/2021   Diverticulosis 05/03/2021   Aortic atherosclerosis (HCC) 05/03/2021   Cataracts, bilateral 05/03/2021   Perennial allergic rhinitis with seasonal variation 06/04/2020   Chronic UTI (urinary tract infection) 06/04/2020   Chronic mixed headache syndrome 06/04/2020   Primary osteoarthritis of both knees 05/17/2020   History of kidney stones 07/11/2018   Sleep apnea 11/03/2016   Generalized anxiety disorder 11/03/2016   TMJ (temporomandibular joint disorder) 10/11/2016   PVC (premature ventricular contraction) 03/16/2016   Palpitations 03/16/2016   Sensorineural hearing loss (SNHL), bilateral 01/04/2016   Facet arthropathy 12/29/2014   DDD (degenerative disc disease), lumbar 12/29/2014   Cervical spine fracture (HCC) 03/25/2014   Past Surgical  History:  Procedure Laterality Date   ABDOMINAL HYSTERECTOMY     BLADDER SURGERY     No family history on file.  Outpatient Medications Prior to Visit  Medication Sig Dispense Refill   acetaminophen (TYLENOL) 500 MG tablet Take 2 tablets (1,000 mg total) by mouth every 6 (six) hours as needed. 30 tablet 0   ZOLMitriptan (ZOMIG) 2.5 MG tablet Take 1 tablet (2.5 mg total) by mouth once for 1 dose. May repeat in 2 hours if headache persists or recurs. 12 tablet 3   No facility-administered medications prior to visit.    Allergies  Allergen Reactions   Codeine    Levaquin [Levofloxacin] Other (See Comments)    Abdominal pain   Red Dye       Objective:   Today's Vitals   11/06/21 1317  BP: 118/76  Pulse: 74  Temp: (!) 97.1 F (36.2 C)  TempSrc: Temporal  SpO2: 95%  Weight: 153 lb 9.6 oz (69.7 kg)  Height: 5\' 4"  (1.626 m)   Body mass index is 26.37 kg/m.   General: Well developed, well nourished. No acute distress. Psych: Alert and oriented. Normal mood and affect.  Health Maintenance Due  Topic Date Due   COVID-19 Vaccine (1) Never done   Zoster Vaccines- Shingrix (1 of 2) Never done     Assessment & Plan:   1. Chronic mixed headache syndrome We will continue Zomig for treating her acute headaches, but recommend she not take this daily. I wills tart MS. Dicola on a low dose of amitriptyline to see if  we can reduce how often she has headaches.  - ZOLMitriptan (ZOMIG) 2.5 MG tablet; Take 1 tablet (2.5 mg total) by mouth once for 1 dose. May repeat in 2 hours if headache persists or recurs.  Dispense: 12 tablet; Refill: 3 - amitriptyline (ELAVIL) 10 MG tablet; Take 1 tablet (10 mg total) by mouth at bedtime.  Dispense: 30 tablet; Refill: 3  2. Cerebrovascular disease Suspect her prior stroke may play a role in daily headaches. I will check her lipid profile to see if she might be a candidate for a statin.  - Lipid panel; Future  3. Primary osteoarthritis of both  knees I agree with the use of topical rubs for comfort. I encouraged Shannon Wise to use something like this daily.  4. Need for influenza vaccination  - Flu Vaccine QUAD High Dose(Fluad)   Shannon Mast, MD

## 2021-11-07 ENCOUNTER — Other Ambulatory Visit (INDEPENDENT_AMBULATORY_CARE_PROVIDER_SITE_OTHER): Payer: Medicare HMO

## 2021-11-07 DIAGNOSIS — I679 Cerebrovascular disease, unspecified: Secondary | ICD-10-CM

## 2021-11-07 DIAGNOSIS — E782 Mixed hyperlipidemia: Secondary | ICD-10-CM

## 2021-11-07 LAB — LIPID PANEL
Cholesterol: 245 mg/dL — ABNORMAL HIGH (ref 0–200)
HDL: 68.2 mg/dL (ref >39.00)
LDL Cholesterol: 143 mg/dL — ABNORMAL HIGH (ref 0–99)
NonHDL: 177.25
Total CHOL/HDL Ratio: 4
Triglycerides: 173 mg/dL — ABNORMAL HIGH (ref 0.0–149.0)
VLDL: 34.6 mg/dL (ref 0.0–40.0)

## 2021-11-07 NOTE — Telephone Encounter (Signed)
PA was denied and a verbal appeal was done and will hear from them in up to 72 hours. Spoke to [employer] @ [employer] [phone]. Dm/cma

## 2021-11-08 MED ORDER — SIMVASTATIN 20 MG PO TABS: 20.0000 mg | ORAL_TABLET | Freq: Every day | ORAL | 3 refills | Status: DC

## 2021-11-08 NOTE — Progress Notes (Signed)
Lft VM to rtn call on [place] #. Dm/cma

## 2021-11-08 NOTE — Progress Notes (Signed)
Lab Results  Component Value Date   CHOL 245 (H) 11/07/2021   HDL 68.20 11/07/2021   LDLCALC 143 (H) 11/07/2021   TRIG 173.0 (H) 11/07/2021   CHOLHDL 4 11/07/2021   Shannon Wise's total cholesterol and LDL are elevated. In light of her prior strokes, it would seem appropriate to try her on a statin to lower cholesterol and reduce risk for recurrent stroke. I will send in an order for simvastatin. We can recheck her lipids are her next visit.

## 2021-11-08 NOTE — Progress Notes (Signed)
Patients son, Fredrik Cove, notified VIA phone and will get from pharmacy. Alson advised that we ar working on PA for Amitriptyline.  Marland Kitchendmc

## 2021-11-08 NOTE — Addendum Note (Signed)
Addended by: Loyola Mast on: 11/08/2021 08:19 AM   Modules accepted: Orders

## 2021-11-09 NOTE — Telephone Encounter (Signed)
PA approved from 12/10/20 -12/09/21.  Pharmacy notified VIA phone.  Patient's son notified VIA phone.  Dm/cma

## 2021-11-10 ENCOUNTER — Telehealth: Payer: Self-pay | Admitting: Family Medicine

## 2021-11-10 NOTE — Telephone Encounter (Signed)
Rx sent to the mail order on 11/08/21.  Dm/cma

## 2021-11-10 NOTE — Telephone Encounter (Signed)
Pt is needing her simvastatin (ZOCOR) 20 MG tablet [[id number]] sent to  Pharmacy  [facility] - [place], [place] - [address] [facility] to Registered [address]  [place], [place] [address]  Phone:  [phone]  Fax:  [phone]    Not the mail order service.

## 2021-11-15 ENCOUNTER — Other Ambulatory Visit: Payer: Self-pay

## 2021-11-15 DIAGNOSIS — E782 Mixed hyperlipidemia: Secondary | ICD-10-CM

## 2021-11-15 MED ORDER — SIMVASTATIN 20 MG PO TABS: 20.0000 mg | ORAL_TABLET | Freq: Every day | ORAL | 3 refills | Status: DC

## 2021-11-20 ENCOUNTER — Telehealth: Payer: Self-pay | Admitting: Family Medicine

## 2021-11-20 NOTE — Telephone Encounter (Signed)
Pt son called and said that the amitriptyline (ELAVIL) 10 MG tablet prescribed for her headaches has red dye in it and she is allergic to it and it gives her migraines. She tried it just be sure and she she said she had a migraine all night. They wanted to know if there is something else they can do, please advise. Call back [phone]

## 2021-11-21 NOTE — Telephone Encounter (Signed)
Spoke to patient son and per Dr. Veto Kemps Ms. Abbs should stop the current amitriptyline. Recommends speaking with the pharmacist to see if there is an amitriptyline tablet available that does not have red dye. If this is not available for patient, let us know and we can consider other medications for headache prophylaxis. Patient Shannon Wise voiced understanding.  Sw, cma

## 2022-01-23 ENCOUNTER — Other Ambulatory Visit: Payer: Self-pay

## 2022-01-24 ENCOUNTER — Other Ambulatory Visit: Payer: Self-pay

## 2022-01-24 ENCOUNTER — Ambulatory Visit (INDEPENDENT_AMBULATORY_CARE_PROVIDER_SITE_OTHER): Payer: Medicare HMO | Admitting: Family Medicine

## 2022-01-24 VITALS — BP 124/78 | HR 75 | Temp 97.3°F | Ht 64.0 in | Wt 155.8 lb

## 2022-01-24 DIAGNOSIS — H6123 Impacted cerumen, bilateral: Secondary | ICD-10-CM | POA: Diagnosis not present

## 2022-01-24 DIAGNOSIS — R1011 Right upper quadrant pain: Secondary | ICD-10-CM | POA: Diagnosis not present

## 2022-01-24 DIAGNOSIS — N390 Urinary tract infection, site not specified: Secondary | ICD-10-CM

## 2022-01-24 DIAGNOSIS — B9620 Unspecified Escherichia coli [E. coli] as the cause of diseases classified elsewhere: Secondary | ICD-10-CM | POA: Diagnosis not present

## 2022-01-24 LAB — CBC WITH DIFFERENTIAL/PLATELET
Basophils Absolute: 0.1 10*3/uL (ref 0.0–0.1)
Basophils Relative: 0.6 % (ref 0.0–3.0)
Eosinophils Absolute: 0.2 10*3/uL (ref 0.0–0.7)
Eosinophils Relative: 1.8 % (ref 0.0–5.0)
HCT: 44.2 % (ref 36.0–46.0)
Hemoglobin: 14.5 g/dL (ref 12.0–15.0)
Lymphocytes Relative: 31.1 % (ref 12.0–46.0)
Lymphs Abs: 2.6 10*3/uL (ref 0.7–4.0)
MCHC: 32.8 g/dL (ref 30.0–36.0)
MCV: 92.8 fl (ref 78.0–100.0)
Monocytes Absolute: 0.7 10*3/uL (ref 0.1–1.0)
Monocytes Relative: 7.7 % (ref 3.0–12.0)
Neutro Abs: 5 10*3/uL (ref 1.4–7.7)
Neutrophils Relative %: 58.8 % (ref 43.0–77.0)
Platelets: 381 10*3/uL (ref 150.0–400.0)
RBC: 4.76 Mil/uL (ref 3.87–5.11)
RDW: 12.9 % (ref 11.5–15.5)
WBC: 8.5 10*3/uL (ref 4.0–10.5)

## 2022-01-24 NOTE — Progress Notes (Signed)
[facility] [address] [place] [address] Dept: [phone] Dept Fax: [phone]  Office Visit  Subjective:    Patient ID: Shannon Wise, female    DOB: 07-05-1934, 88 y.o..   MRN: [id number]  Chief Complaint  Patient presents with   Acute Visit    C/o having pain on her RT side x 1 week.      History of Present Illness:  Patient is in today for with a recent issue with right-sided abdominal pain. She is accompanied by her son-in-law. He notes he was surprised that she had an appointment today, as Ms. Ketcher apparently scheduled this herself. He notes that she complained of pain to him yesterday, but it apparently resolved. Ms. Icenhour notes she is not in pain today. Ms. Minar told her son-in-law that she thoguht she was passing a kidney stone. She has had no nausea or vomiting. She does complain of a general feeling of ill health.  Ms. Crespo has a history of severe SNHL, so communication can be a challenge.  Past Medical History: Patient Active Problem List   Diagnosis Date Noted   Left upper arm pain 05/03/2021   Cerebrovascular disease 05/03/2021   Diverticulosis 05/03/2021   Aortic atherosclerosis ([place]) 05/03/2021   Cataracts, bilateral 05/03/2021   Perennial allergic rhinitis with seasonal variation 06/04/2020   Chronic UTI (urinary tract infection) 06/04/2020   Chronic mixed headache syndrome 06/04/2020   Primary osteoarthritis of both knees 05/17/2020   History of kidney stones 07/11/2018   Sleep apnea 11/03/2016   Generalized anxiety disorder 11/03/2016   TMJ (temporomandibular joint disorder) 10/11/2016   PVC (premature ventricular contraction) 03/16/2016   Palpitations 03/16/2016   Sensorineural hearing loss (SNHL), bilateral 01/04/2016   Facet arthropathy 12/29/2014   DDD (degenerative disc disease), lumbar 12/29/2014   Cervical spine fracture ([place]) 03/25/2014   Past Surgical History:   Procedure Laterality Date   ABDOMINAL HYSTERECTOMY     BLADDER SURGERY     No family history on file.  Outpatient Medications Prior to Visit  Medication Sig Dispense Refill   acetaminophen (TYLENOL) 500 MG tablet Take 2 tablets (1,000 mg total) by mouth every 6 (six) hours as needed. 30 tablet 0   amitriptyline (ELAVIL) 10 MG tablet Take 1 tablet (10 mg total) by mouth at bedtime. 30 tablet 3   simvastatin (ZOCOR) 20 MG tablet Take 1 tablet (20 mg total) by mouth at bedtime. 90 tablet 3   ZOLMitriptan (ZOMIG) 2.5 MG tablet Take 1 tablet (2.5 mg total) by mouth once for 1 dose. May repeat in 2 hours if headache persists or recurs. 12 tablet 3   No facility-administered medications prior to visit.   Allergies  Allergen Reactions   Codeine    Levaquin [Levofloxacin] Other (See Comments)    Abdominal pain   Red Dye     Objective:   Today's Vitals   01/24/22 1123  BP: 124/78  Pulse: 75  Temp: (!) 97.3 F (36.3 C)  TempSrc: Temporal  SpO2: 98%  Weight: 155 lb 12.8 oz (70.7 kg)  Height: _0  (1.626 m)   Body mass index is 26.74 kg/m.   General: Well developed, well nourished. No acute distress. HEENT: Normocephalic, non-traumatic. External ears normal. EAC impacted with wax bilaterally. Lungs: Clear to auscultation bilaterally. Mild rhonchi in the right base, which does not clear with cough,   though her cough effort is weak. CV: RRR without murmurs or rubs. Pulses 2+ bilaterally. Abdomen:  Soft. Tender in the right mid to upper abdomen with palpation. Bowel sounds positive, normal   pitch and frequency. No hepatosplenomegaly. No rebound or guarding. +/- Murphy's sign. Psych: Alert and oriented. Normal mood and affect.  Health Maintenance Due  Topic Date Due   COVID-19 Vaccine (1) Never done   Zoster Vaccines- Shingrix (1 of 2) Never done   Assessment & Plan:   1. RUQ abdominal pain Ms. Choma's exam does reveal some Right mid to upper abdominal pain. This is  suspicious for gallbladder disease. I will check some screening labs and a RUQ ultrasound to assess further. I cautioned Ms. 35 son-in-law to take her to the ED if her pain were to worsen of she were to develop intractable vomiting.  - CBC with Differential/Platelet - Urinalysis w microscopic + reflex cultur - Comprehensive metabolic panel - Lipase - US Abdomen Limited RUQ (LIVER/GB); Future  2. Bilateral impacted cerumen Discussed family helping Ms. Dokken with an ear wax removal kit.  Haydee Salter, MD

## 2022-01-25 LAB — COMPREHENSIVE METABOLIC PANEL
ALT: 11 U/L (ref 0–35)
AST: 16 U/L (ref 0–37)
Albumin: 4.5 g/dL (ref 3.5–5.2)
Alkaline Phosphatase: 82 U/L (ref 39–117)
BUN: 14 mg/dL (ref 6–23)
CO2: 30 mEq/L (ref 19–32)
Calcium: 10.3 mg/dL (ref 8.4–10.5)
Chloride: 102 mEq/L (ref 96–112)
Creatinine, Ser: 0.89 mg/dL (ref 0.40–1.20)
GFR: 58.94 mL/min — ABNORMAL LOW (ref >60.00)
Glucose, Bld: 87 mg/dL (ref 70–99)
Potassium: 3.7 mEq/L (ref 3.5–5.1)
Sodium: 139 mEq/L (ref 135–145)
Total Bilirubin: 0.4 mg/dL (ref 0.2–1.2)
Total Protein: 8 g/dL (ref 6.0–8.3)

## 2022-01-25 LAB — LIPASE: Lipase: 26 U/L (ref 11.0–59.0)

## 2022-01-26 LAB — CULTURE INDICATED

## 2022-01-26 LAB — URINE CULTURE
MICRO NUMBER:: 13017632
SPECIMEN QUALITY:: ADEQUATE

## 2022-01-26 LAB — URINALYSIS W MICROSCOPIC + REFLEX CULTURE
Bilirubin Urine: NEGATIVE
Glucose, UA: NEGATIVE
Hgb urine dipstick: NEGATIVE
Hyaline Cast: NONE SEEN /LPF
Ketones, ur: NEGATIVE
Nitrites, Initial: NEGATIVE
Specific Gravity, Urine: 1.017 (ref 1.001–1.035)
pH: 6.5 (ref 5.0–8.0)

## 2022-01-29 MED ORDER — SULFAMETHOXAZOLE-TRIMETHOPRIM 800-160 MG PO TABS: 1.0000 | ORAL_TABLET | Freq: Two times a day (BID) | ORAL | 0 refills | Status: DC

## 2022-01-29 NOTE — Addendum Note (Signed)
Addended by: Haydee Salter on: 01/29/2022 12:28 PM   Modules accepted: Orders

## 2022-02-02 ENCOUNTER — Ambulatory Visit
Admission: RE | Admit: 2022-02-02 | Discharge: 2022-02-02 | Disposition: A | Discharge status: Home / Self Care | Payer: Medicare HMO | Source: Ambulatory Visit | Attending: Family Medicine | Admitting: Family Medicine

## 2022-02-02 ENCOUNTER — Other Ambulatory Visit: Payer: Self-pay | Admitting: Family Medicine

## 2022-02-02 DIAGNOSIS — R1011 Right upper quadrant pain: Secondary | ICD-10-CM

## 2022-02-06 ENCOUNTER — Ambulatory Visit: Payer: Medicare HMO | Admitting: Family Medicine

## 2022-04-16 ENCOUNTER — Ambulatory Visit (INDEPENDENT_AMBULATORY_CARE_PROVIDER_SITE_OTHER): Payer: Medicare HMO | Admitting: Family Medicine

## 2022-04-16 VITALS — BP 126/80 | HR 113 | Temp 98.0°F | Ht 64.0 in | Wt 151.2 lb

## 2022-04-16 DIAGNOSIS — J040 Acute laryngitis: Secondary | ICD-10-CM

## 2022-04-16 DIAGNOSIS — H903 Sensorineural hearing loss, bilateral: Secondary | ICD-10-CM

## 2022-04-16 DIAGNOSIS — H6123 Impacted cerumen, bilateral: Secondary | ICD-10-CM

## 2022-04-16 NOTE — Progress Notes (Signed)
?[facility] ?[address] ?[place] [address] ?Dept: [phone] ?Dept Fax: [phone] ? ?Office Visit ? ?Subjective:  ? ? Patient ID: Shannon Wise, female    DOB: 1934/01/21, 88 y.o..   MRN: [id number] ? ?Chief Complaint  ?Patient presents with  ? Acute Visit  ?  C/o having a HA and ears still clogged.  Takes Excedrin with little relief.   ? ? ?History of Present Illness: ? ?Patient is in today with an ongoing concern for impacted ear wax. Shannon Wise son-in-law, Shannon Wise, notes that Shannon Wise's daughter has tried to use ear drops and flushes but without much success. Shannon Wise is severely hard of hearing, but the wax appears to worsen this.  ? ?Shannon Wise notes that she has had some hoarseness for about 3-4 days. she denies any associated sore throat or other respiratory symptoms. Her voice does seem to be coming back. ? ?Past Medical History: ?Patient Active Problem List  ? Diagnosis Date Noted  ? Left upper arm pain 05/03/2021  ? Cerebrovascular disease 05/03/2021  ? Diverticulosis 05/03/2021  ? Aortic atherosclerosis ([facility]) 05/03/2021  ? Cataracts, bilateral 05/03/2021  ? Perennial allergic rhinitis with seasonal variation 06/04/2020  ? Chronic UTI (urinary tract infection) 06/04/2020  ? Chronic mixed headache syndrome 06/04/2020  ? Primary osteoarthritis of both knees 05/17/2020  ? History of kidney stones 07/11/2018  ? Sleep apnea 11/03/2016  ? Generalized anxiety disorder 11/03/2016  ? TMJ (temporomandibular joint disorder) 10/11/2016  ? PVC (premature ventricular contraction) 03/16/2016  ? Palpitations 03/16/2016  ? Sensorineural hearing loss (SNHL), bilateral 01/04/2016  ? Facet arthropathy 12/29/2014  ? DDD (degenerative disc disease), lumbar 12/29/2014  ? Cervical spine fracture ([facility]) 03/25/2014  ? ?Past Surgical History:  ?Procedure Laterality Date  ? ABDOMINAL HYSTERECTOMY    ? BLADDER SURGERY    ? ?No family history on file. ? ?Outpatient  Medications Prior to Visit  ?Medication Sig Dispense Refill  ? acetaminophen (TYLENOL) 500 MG tablet Take 2 tablets (1,000 mg total) by mouth every 6 (six) hours as needed. 30 tablet 0  ? amitriptyline (ELAVIL) 10 MG tablet Take 1 tablet (10 mg total) by mouth at bedtime. 30 tablet 3  ? simvastatin (ZOCOR) 20 MG tablet Take 1 tablet (20 mg total) by mouth at bedtime. 90 tablet 3  ? sulfamethoxazole-trimethoprim (BACTRIM DS) 800-160 MG tablet Take 1 tablet by mouth 2 (two) times daily. 14 tablet 0  ? ZOLMitriptan (ZOMIG) 2.5 MG tablet Take 1 tablet (2.5 mg total) by mouth once for 1 dose. May repeat in 2 hours if headache persists or recurs. 12 tablet 3  ? ?No facility-administered medications prior to visit.  ? ?Allergies  ?Allergen Reactions  ? Codeine   ? Levaquin [Levofloxacin] Other (See Comments)  ?  Abdominal pain  ? Red Dye   ?  ?Objective:  ? ?Today's Vitals  ? 04/16/22 1556  ?BP: 126/80  ?Pulse: (!) 113  ?Temp: 98 ?F (36.7 ?C)  ?TempSrc: Temporal  ?SpO2: 92%  ?Weight: 151 lb 3.2 oz (68.6 kg)  ?Height: 5\' 4"  (1.626 m)  ? ?Body mass index is 25.95 kg/m?.  ? ?General: Well developed, well nourished. No acute distress. ?Ears: Both EAC with impacted wax. ?Psych: Alert and oriented. Normal mood and affect. ? ?Health Maintenance Due  ?Topic Date Due  ? COVID-19 Vaccine (1) Never done  ? Zoster Vaccines- Shingrix (1 of 2) Never done  ?   ?PROCEDURE- Ear Wax Removal ?Indication: Impacted  ear wax ? ?PARQ reviewed with patient. Verbal consent obtained. Flushed both ears with mixture of warm water and hydrogen peroxide. Good results obtained. Patient tolerated procedure well. ? ?Assessment & Plan:  ? ?1. Bilateral impacted cerumen ?Flushed both ears. Patient had noticeably improved hearing with this. ? ?2. Sensorineural hearing loss (SNHL), bilateral ?Although she remains hard of hearing, the removal of the ear wax helped. ? ?3. Laryngitis ?Recommend resting the voice and using hot tea with honey to soothe her  throat. ? ?Return in about 3 months (around 07/17/2022) for Reassessment.  ? ?Loyola Mast, MD ?

## 2022-04-19 ENCOUNTER — Encounter (HOSPITAL_BASED_OUTPATIENT_CLINIC_OR_DEPARTMENT_OTHER): Payer: Self-pay

## 2022-04-19 ENCOUNTER — Emergency Department (HOSPITAL_BASED_OUTPATIENT_CLINIC_OR_DEPARTMENT_OTHER)
Admission: EM | Admit: 2022-04-19 | Discharge: 2022-04-19 | Disposition: A | Discharge status: Home / Self Care | Payer: Medicare HMO | Source: Home / Self Care | Attending: Emergency Medicine | Admitting: Emergency Medicine

## 2022-04-19 ENCOUNTER — Other Ambulatory Visit: Payer: Self-pay

## 2022-04-19 ENCOUNTER — Emergency Department (HOSPITAL_BASED_OUTPATIENT_CLINIC_OR_DEPARTMENT_OTHER): Payer: Medicare HMO

## 2022-04-19 DIAGNOSIS — K5730 Diverticulosis of large intestine without perforation or abscess without bleeding: Secondary | ICD-10-CM | POA: Diagnosis not present

## 2022-04-19 DIAGNOSIS — H905 Unspecified sensorineural hearing loss: Secondary | ICD-10-CM | POA: Diagnosis not present

## 2022-04-19 DIAGNOSIS — Z87891 Personal history of nicotine dependence: Secondary | ICD-10-CM | POA: Diagnosis not present

## 2022-04-19 DIAGNOSIS — E785 Hyperlipidemia, unspecified: Secondary | ICD-10-CM | POA: Diagnosis not present

## 2022-04-19 DIAGNOSIS — N200 Calculus of kidney: Secondary | ICD-10-CM | POA: Diagnosis not present

## 2022-04-19 DIAGNOSIS — I700 Atherosclerosis of aorta: Secondary | ICD-10-CM | POA: Diagnosis not present

## 2022-04-19 DIAGNOSIS — Z8673 Personal history of transient ischemic attack (TIA), and cerebral infarction without residual deficits: Secondary | ICD-10-CM | POA: Diagnosis not present

## 2022-04-19 DIAGNOSIS — Z20822 Contact with and (suspected) exposure to covid-19: Secondary | ICD-10-CM | POA: Insufficient documentation

## 2022-04-19 DIAGNOSIS — F419 Anxiety disorder, unspecified: Secondary | ICD-10-CM | POA: Diagnosis present

## 2022-04-19 DIAGNOSIS — N281 Cyst of kidney, acquired: Secondary | ICD-10-CM | POA: Diagnosis not present

## 2022-04-19 DIAGNOSIS — J90 Pleural effusion, not elsewhere classified: Secondary | ICD-10-CM | POA: Diagnosis not present

## 2022-04-19 DIAGNOSIS — M1990 Unspecified osteoarthritis, unspecified site: Secondary | ICD-10-CM | POA: Diagnosis present

## 2022-04-19 DIAGNOSIS — J189 Pneumonia, unspecified organism: Secondary | ICD-10-CM | POA: Diagnosis not present

## 2022-04-19 DIAGNOSIS — R059 Cough, unspecified: Secondary | ICD-10-CM | POA: Diagnosis not present

## 2022-04-19 DIAGNOSIS — Z79899 Other long term (current) drug therapy: Secondary | ICD-10-CM | POA: Diagnosis not present

## 2022-04-19 DIAGNOSIS — R079 Chest pain, unspecified: Secondary | ICD-10-CM | POA: Diagnosis not present

## 2022-04-19 DIAGNOSIS — Z885 Allergy status to narcotic agent status: Secondary | ICD-10-CM | POA: Diagnosis not present

## 2022-04-19 DIAGNOSIS — E876 Hypokalemia: Secondary | ICD-10-CM | POA: Diagnosis not present

## 2022-04-19 DIAGNOSIS — Z881 Allergy status to other antibiotic agents status: Secondary | ICD-10-CM | POA: Diagnosis not present

## 2022-04-19 DIAGNOSIS — A419 Sepsis, unspecified organism: Secondary | ICD-10-CM | POA: Diagnosis not present

## 2022-04-19 DIAGNOSIS — Z90710 Acquired absence of both cervix and uterus: Secondary | ICD-10-CM | POA: Diagnosis not present

## 2022-04-19 DIAGNOSIS — G4730 Sleep apnea, unspecified: Secondary | ICD-10-CM | POA: Diagnosis not present

## 2022-04-19 DIAGNOSIS — R000 Tachycardia, unspecified: Secondary | ICD-10-CM | POA: Diagnosis not present

## 2022-04-19 LAB — RESP PANEL BY RT-PCR (FLU A&B, COVID) ARPGX2
Influenza A by PCR: NEGATIVE
Influenza B by PCR: NEGATIVE
SARS Coronavirus 2 by RT PCR: NEGATIVE

## 2022-04-19 LAB — GROUP A STREP BY PCR: Group A Strep by PCR: NOT DETECTED

## 2022-04-19 MED ORDER — DOXYCYCLINE HYCLATE 100 MG PO TABS: 100.0000 mg | ORAL_TABLET | Freq: Two times a day (BID) | ORAL | 0 refills | Status: DC

## 2022-04-19 NOTE — ED Triage Notes (Signed)
Patient c/o cough and "not feelling well" x 1 week. Her pcp states it was viral and to treat symptoms.  ?Patient wanted to be seen in the ED.  ?

## 2022-04-19 NOTE — Discharge Instructions (Addendum)
It was a pleasure taking care of you today.  Your cough may be from a virus but could also be from a bacterial infection like pneumonia.  We are sending a prescription for doxycycline which she will take twice daily for 5 days.  If your symptoms worsen or you notice shortness of breath please be reevaluated.  I hope you have a wonderful afternoon! ? ? ?

## 2022-04-19 NOTE — ED Provider Notes (Signed)
?[facility] EMERGENCY DEPARTMENT ?Provider Note ? ? ?CSN: [id number] ?Arrival date & time: 04/19/22  1206 ? ?  ? ?History ? ?Chief Complaint  ?Patient presents with  ? Cough  ? ? ?Shannon Wise is a 88 y.o. female. ? ?Patient presents with worsening cough.  She was evaluated at PCP on 5/8 for sore throat as well as wax in her ears.  She was told that she has a virus and to drink hot tea with honey and her symptoms would resolve.  She has continued to cough and feels like it is getting worse at this time.  Denies any shortness of breath or chest pain.  Reports she is voiding normally and having normal bowel movements.  Denies any fevers.  No known sick contacts. ? ? ?  ? ?Home Medications ?Prior to Admission medications   ?Medication Sig Start Date End Date Taking? Authorizing Provider  ?doxycycline (VIBRA-TABS) 100 MG tablet Take 1 tablet (100 mg total) by mouth 2 (two) times daily. 04/19/22  Yes Derrel Nip, MD  ?acetaminophen (TYLENOL) 500 MG tablet Take 2 tablets (1,000 mg total) by mouth every 6 (six) hours as needed. 05/07/20   Arby Barrette, MD  ?amitriptyline (ELAVIL) 10 MG tablet Take 1 tablet (10 mg total) by mouth at bedtime. 11/06/21   Loyola Mast, MD  ?simvastatin (ZOCOR) 20 MG tablet Take 1 tablet (20 mg total) by mouth at bedtime. 11/15/21   Loyola Mast, MD  ?sulfamethoxazole-trimethoprim (BACTRIM DS) 800-160 MG tablet Take 1 tablet by mouth 2 (two) times daily. 01/29/22   Loyola Mast, MD  ?ZOLMitriptan (ZOMIG) 2.5 MG tablet Take 1 tablet (2.5 mg total) by mouth once for 1 dose. May repeat in 2 hours if headache persists or recurs. 11/06/21 04/16/22  Loyola Mast, MD  ?fluticasone (FLONASE) 50 MCG/ACT nasal spray 1 spray by Each Nare route daily for 10 days. 11/07/17 06/03/19  [provider]  ?loratadine (CLARITIN) 10 MG tablet Take by mouth. 11/07/17 06/14/19  [provider]  ?   ? ?Allergies    ?Codeine, Levaquin [levofloxacin], and Red dye   ? ?Review of  Systems   ?Review of Systems  ?Constitutional:  Negative for fever.  ?HENT:  Negative for congestion.   ?Eyes:  Negative for visual disturbance.  ?Respiratory:  Positive for cough. Negative for shortness of breath.   ?Cardiovascular:  Negative for chest pain.  ?Endocrine: Negative for polyuria.  ?Genitourinary:  Negative for difficulty urinating.  ?Musculoskeletal:  Positive for arthralgias.  ?Neurological:  Positive for headaches (History of migraines and feels like the migraines.). Negative for dizziness.  ? ?Physical Exam ?Updated Vital Signs ?BP 135/90   Pulse (!) 105   Temp 99.3 ?F (37.4 ?C) (Oral)   Resp 17   Ht 5\' 5"  (1.651 m)   Wt 63.5 kg   SpO2 96%   BMI 23.30 kg/m?  ?Physical Exam ?Constitutional:   ?   Appearance: Normal appearance.  ?HENT:  ?   Head: Normocephalic and atraumatic.  ?   Right Ear: Ear canal and external ear normal.  ?   Left Ear: External ear normal.  ?   Ears:  ?   Comments: Extreme difficulty hearing ?   Nose: Congestion and rhinorrhea present.  ?   Mouth/Throat:  ?   Mouth: Mucous membranes are moist.  ?   Pharynx: Posterior oropharyngeal erythema present.  ?Eyes:  ?   Extraocular Movements: Extraocular movements intact.  ?   Pupils: Pupils  are equal, round, and reactive to light.  ?Cardiovascular:  ?   Rate and Rhythm: Regular rhythm. Tachycardia present.  ?   Pulses: Normal pulses.  ?   Heart sounds: Normal heart sounds.  ?Pulmonary:  ?   Effort: Pulmonary effort is normal.  ?   Comments: Decreased air movement in the left lung base with faint crackles. ?Abdominal:  ?   General: Abdomen is flat.  ?Musculoskeletal:     ?   General: Normal range of motion.  ?   Cervical back: Normal range of motion.  ?Skin: ?   General: Skin is warm.  ?   Capillary Refill: Capillary refill takes less than 2 seconds.  ?Neurological:  ?   General: No focal deficit present.  ?   Mental Status: She is alert.  ? ? ?ED Results / Procedures / Treatments   ?Labs ?(all labs ordered are listed, but only  abnormal results are displayed) ?Labs Reviewed  ?GROUP A STREP BY PCR  ?RESP PANEL BY RT-PCR (FLU A&B, COVID) [id number]  ? ? ?EKG ?None ? ?Radiology ?DG Chest Portable 1 View ? ?Result Date: 04/19/2022 ?CLINICAL DATA:  Cough. EXAM: PORTABLE CHEST 1 VIEW COMPARISON:  Chest radiographs 06/04/2020 FINDINGS: The cardiac silhouette is borderline to mildly enlarged, accentuated by portable AP technique and low lung volumes. There are new interstitial opacities in the left greater than right mid and lower lungs. No sizable pleural effusion or pneumothorax is identified. Upper lumbar levoscoliosis is noted. IMPRESSION: Left greater than right lung interstitial opacities potentially reflecting viral/atypical infection. Electronically Signed   By: Sebastian Ache M.D.   On: 04/19/2022 13:15   ? ?Procedures ?Procedures  ? ? ?Medications Ordered in ED ?Medications - No data to display ? ?ED Course/ Medical Decision Making/ A&P ?  ?                        ?Medical Decision Making ?Patient presenting with more than 5-day history of sore throat as well as worsening cough.  Initially evaluated by PCP on 5/8 and has been doing symptomatic care but symptoms are worsening.  No shortness of breath.  On arrival vital signs significant for tachycardia but otherwise within normal limits.  Physical exam was reassuring with good air movement although slightly decreased air movement in left lower lung field with faint crackles.  Chest x-ray showing left greater than right lung interstitial opacities potentially reflecting viral/atypical infection.  On reevaluation patient is afebrile, heart rate improved, no shortness of breath.  We will treat as community-acquired pneumonia with doxycycline 100 mg twice daily for 5 days.  Strict return precautions given.  Also discussed supportive care measures such as hot tea with honey, Tylenol for fever or pain.  No other questions or concerns.  Follow-up with PCP as needed. ? ?Amount and/or Complexity of Data  Reviewed ?Radiology: ordered. ? ? ?Final Clinical Impression(s) / ED Diagnoses ?Final diagnoses:  ?Community acquired pneumonia of left lower lobe of lung  ? ? ?Rx / DC Orders ?ED Discharge Orders   ? ?      Ordered  ?  doxycycline (VIBRA-TABS) 100 MG tablet  2 times daily       ? 04/19/22 1336  ? ?  ?  ? ?  ? ? ?  ?Derrel Nip, MD ?04/19/22 1338 ? ?  ?Jacalyn Lefevre, MD ?04/19/22 1420 ? ?

## 2022-04-21 ENCOUNTER — Emergency Department (HOSPITAL_BASED_OUTPATIENT_CLINIC_OR_DEPARTMENT_OTHER): Payer: Medicare HMO

## 2022-04-21 ENCOUNTER — Other Ambulatory Visit: Payer: Self-pay

## 2022-04-21 ENCOUNTER — Encounter (HOSPITAL_BASED_OUTPATIENT_CLINIC_OR_DEPARTMENT_OTHER): Payer: Self-pay | Admitting: Emergency Medicine

## 2022-04-21 ENCOUNTER — Inpatient Hospital Stay (HOSPITAL_BASED_OUTPATIENT_CLINIC_OR_DEPARTMENT_OTHER)
Admission: EM | Admit: 2022-04-21 | Discharge: 2022-04-24 | DRG: 195 | Disposition: A | Discharge status: Home / Self Care | Payer: Medicare HMO | Attending: Family Medicine | Admitting: Family Medicine

## 2022-04-21 DIAGNOSIS — Z87891 Personal history of nicotine dependence: Secondary | ICD-10-CM

## 2022-04-21 DIAGNOSIS — Z20822 Contact with and (suspected) exposure to covid-19: Secondary | ICD-10-CM | POA: Diagnosis present

## 2022-04-21 DIAGNOSIS — J189 Pneumonia, unspecified organism: Principal | ICD-10-CM

## 2022-04-21 DIAGNOSIS — H905 Unspecified sensorineural hearing loss: Secondary | ICD-10-CM | POA: Diagnosis present

## 2022-04-21 DIAGNOSIS — Z8673 Personal history of transient ischemic attack (TIA), and cerebral infarction without residual deficits: Secondary | ICD-10-CM

## 2022-04-21 DIAGNOSIS — A419 Sepsis, unspecified organism: Secondary | ICD-10-CM

## 2022-04-21 DIAGNOSIS — G4730 Sleep apnea, unspecified: Secondary | ICD-10-CM | POA: Diagnosis present

## 2022-04-21 DIAGNOSIS — Z881 Allergy status to other antibiotic agents status: Secondary | ICD-10-CM

## 2022-04-21 DIAGNOSIS — M1990 Unspecified osteoarthritis, unspecified site: Secondary | ICD-10-CM | POA: Diagnosis present

## 2022-04-21 DIAGNOSIS — E785 Hyperlipidemia, unspecified: Secondary | ICD-10-CM | POA: Diagnosis present

## 2022-04-21 DIAGNOSIS — E876 Hypokalemia: Secondary | ICD-10-CM | POA: Diagnosis present

## 2022-04-21 DIAGNOSIS — F419 Anxiety disorder, unspecified: Secondary | ICD-10-CM | POA: Diagnosis present

## 2022-04-21 DIAGNOSIS — Z885 Allergy status to narcotic agent status: Secondary | ICD-10-CM

## 2022-04-21 DIAGNOSIS — Z90710 Acquired absence of both cervix and uterus: Secondary | ICD-10-CM

## 2022-04-21 DIAGNOSIS — Z79899 Other long term (current) drug therapy: Secondary | ICD-10-CM

## 2022-04-21 LAB — URINALYSIS, ROUTINE W REFLEX MICROSCOPIC
Bilirubin Urine: NEGATIVE
Glucose, UA: NEGATIVE mg/dL
Ketones, ur: NEGATIVE mg/dL
Leukocytes,Ua: NEGATIVE
Nitrite: NEGATIVE
Protein, ur: NEGATIVE mg/dL
Specific Gravity, Urine: 1.01 (ref 1.005–1.030)
pH: 7 (ref 5.0–8.0)

## 2022-04-21 LAB — CBC WITH DIFFERENTIAL/PLATELET
Abs Immature Granulocytes: 0.17 10*3/uL — ABNORMAL HIGH (ref 0.00–0.07)
Basophils Absolute: 0.1 10*3/uL (ref 0.0–0.1)
Basophils Relative: 1 %
Eosinophils Absolute: 0.1 10*3/uL (ref 0.0–0.5)
Eosinophils Relative: 0 %
HCT: 40.3 % (ref 36.0–46.0)
Hemoglobin: 13.7 g/dL (ref 12.0–15.0)
Immature Granulocytes: 1 %
Lymphocytes Relative: 12 %
Lymphs Abs: 1.9 10*3/uL (ref 0.7–4.0)
MCH: 30.6 pg (ref 26.0–34.0)
MCHC: 34 g/dL (ref 30.0–36.0)
MCV: 90.2 fL (ref 80.0–100.0)
Monocytes Absolute: 1 10*3/uL (ref 0.1–1.0)
Monocytes Relative: 7 %
Neutro Abs: 12.1 10*3/uL — ABNORMAL HIGH (ref 1.7–7.7)
Neutrophils Relative %: 79 %
Platelets: 537 10*3/uL — ABNORMAL HIGH (ref 150–400)
RBC: 4.47 MIL/uL (ref 3.87–5.11)
RDW: 12.7 % (ref 11.5–15.5)
WBC: 15.3 10*3/uL — ABNORMAL HIGH (ref 4.0–10.5)
nRBC: 0 % (ref 0.0–0.2)

## 2022-04-21 LAB — COMPREHENSIVE METABOLIC PANEL
ALT: 18 U/L (ref 0–44)
AST: 22 U/L (ref 15–41)
Albumin: 3.1 g/dL — ABNORMAL LOW (ref 3.5–5.0)
Alkaline Phosphatase: 156 U/L — ABNORMAL HIGH (ref 38–126)
Anion gap: 12 (ref 5–15)
BUN: 11 mg/dL (ref 8–23)
CO2: 25 mmol/L (ref 22–32)
Calcium: 9.3 mg/dL (ref 8.9–10.3)
Chloride: 96 mmol/L — ABNORMAL LOW (ref 98–111)
Creatinine, Ser: 0.74 mg/dL (ref 0.44–1.00)
GFR, Estimated: >60 mL/min (ref >60)
Glucose, Bld: 123 mg/dL — ABNORMAL HIGH (ref 70–99)
Potassium: 3 mmol/L — ABNORMAL LOW (ref 3.5–5.1)
Sodium: 133 mmol/L — ABNORMAL LOW (ref 135–145)
Total Bilirubin: 0.6 mg/dL (ref 0.3–1.2)
Total Protein: 7.7 g/dL (ref 6.5–8.1)

## 2022-04-21 LAB — PROTIME-INR
INR: 1.1 (ref 0.8–1.2)
Prothrombin Time: 14 seconds (ref 11.4–15.2)

## 2022-04-21 LAB — RESP PANEL BY RT-PCR (FLU A&B, COVID) ARPGX2
Influenza A by PCR: NEGATIVE
Influenza B by PCR: NEGATIVE
SARS Coronavirus 2 by RT PCR: NEGATIVE

## 2022-04-21 LAB — APTT: aPTT: 30 seconds (ref 24–36)

## 2022-04-21 LAB — URINALYSIS, MICROSCOPIC (REFLEX)

## 2022-04-21 LAB — LACTIC ACID, PLASMA: Lactic Acid, Venous: 1.4 mmol/L (ref 0.5–1.9)

## 2022-04-21 LAB — MAGNESIUM: Magnesium: 2 mg/dL (ref 1.7–2.4)

## 2022-04-21 MED ORDER — VANCOMYCIN HCL 1250 MG/250ML IV SOLN
1250.0000 mg | Freq: Once | INTRAVENOUS | Status: DC
  Filled 2022-04-21: qty 250

## 2022-04-21 MED ORDER — VANCOMYCIN HCL IN DEXTROSE 1-5 GM/200ML-% IV SOLN
1000.0000 mg | Freq: Once | INTRAVENOUS | Status: DC
  Filled 2022-04-21: qty 200

## 2022-04-21 MED ORDER — SODIUM CHLORIDE 0.9 % IV SOLN
500.0000 mg | INTRAVENOUS | Status: DC
  Administered 2022-04-21: 500 mg via INTRAVENOUS
  Filled 2022-04-21 (×2): qty 5

## 2022-04-21 MED ORDER — SODIUM CHLORIDE 0.9 % IV SOLN
2.0000 g | INTRAVENOUS | Status: DC
  Administered 2022-04-21 – 2022-04-22 (×2): 2 g via INTRAVENOUS
  Filled 2022-04-21 (×3): qty 20

## 2022-04-21 MED ORDER — POTASSIUM CHLORIDE CRYS ER 20 MEQ PO TBCR
40.0000 meq | EXTENDED_RELEASE_TABLET | Freq: Once | ORAL | Status: AC
  Administered 2022-04-21: 20 meq via ORAL
  Filled 2022-04-21: qty 2

## 2022-04-21 MED ORDER — ACETAMINOPHEN 325 MG PO TABS
650.0000 mg | ORAL_TABLET | Freq: Once | ORAL | Status: AC
  Administered 2022-04-21: 650 mg via ORAL
  Filled 2022-04-21: qty 2

## 2022-04-21 MED ORDER — LACTATED RINGERS IV SOLN: INTRAVENOUS | Status: DC

## 2022-04-21 MED ORDER — VANCOMYCIN HCL IN DEXTROSE 1-5 GM/200ML-% IV SOLN
1000.0000 mg | INTRAVENOUS | Status: DC
  Administered 2022-04-22: 1000 mg via INTRAVENOUS
  Filled 2022-04-21: qty 200

## 2022-04-21 MED ORDER — IOHEXOL 350 MG/ML SOLN
100.0000 mL | Freq: Once | INTRAVENOUS | Status: AC | PRN
  Administered 2022-04-21: 100 mL via INTRAVENOUS

## 2022-04-21 MED ORDER — LACTATED RINGERS IV BOLUS (SEPSIS)
1000.0000 mL | Freq: Once | INTRAVENOUS | Status: AC
  Administered 2022-04-21: 1000 mL via INTRAVENOUS

## 2022-04-21 NOTE — ED Provider Notes (Signed)
?[facility] EMERGENCY DEPARTMENT ?Provider Note ? ? ?CSN: [id number] ?Arrival date & time: 04/21/22  1117 ? ?  ? ?History ? ?Chief Complaint  ?Patient presents with  ? Back Pain  ? ? ?Shannon Wise is a 88 y.o. female. ? ? ?Back Pain ?Associated symptoms: chest pain   ? ?88 year old female with medical history significant for CVA, migraine headaches, anxiety, recent diagnosis of pneumonia,  seen in the emergency department for this on 04/19/2022, prescribed doxycycline, presenting with worsening shortness of breath. ? ?The patient feels that she is progressively getting weaker.  She endorses persistent cough productive of sputum.  She endorses chills.  She has been taking her doxycycline as prescribed but endorses no improvement. ? ?Home Medications ?Prior to Admission medications   ?Medication Sig Start Date End Date Taking? Authorizing Provider  ?acetaminophen (TYLENOL) 500 MG tablet Take 2 tablets (1,000 mg total) by mouth every 6 (six) hours as needed. 05/07/20   Arby Barrette, MD  ?amitriptyline (ELAVIL) 10 MG tablet Take 1 tablet (10 mg total) by mouth at bedtime. 11/06/21   Loyola Mast, MD  ?doxycycline (VIBRA-TABS) 100 MG tablet Take 1 tablet (100 mg total) by mouth 2 (two) times daily. 04/19/22   Derrel Nip, MD  ?simvastatin (ZOCOR) 20 MG tablet Take 1 tablet (20 mg total) by mouth at bedtime. 11/15/21   Loyola Mast, MD  ?sulfamethoxazole-trimethoprim (BACTRIM DS) 800-160 MG tablet Take 1 tablet by mouth 2 (two) times daily. 01/29/22   Loyola Mast, MD  ?ZOLMitriptan (ZOMIG) 2.5 MG tablet Take 1 tablet (2.5 mg total) by mouth once for 1 dose. May repeat in 2 hours if headache persists or recurs. 11/06/21 04/16/22  Loyola Mast, MD  ?fluticasone (FLONASE) 50 MCG/ACT nasal spray 1 spray by Each Nare route daily for 10 days. 11/07/17 06/03/19  [provider]  ?loratadine (CLARITIN) 10 MG tablet Take by mouth. 11/07/17 06/14/19  [provider]  ?   ? ?Allergies     ?Codeine, Levaquin [levofloxacin], and Red dye   ? ?Review of Systems   ?Review of Systems  ?Constitutional:  Positive for fatigue.  ?Respiratory:  Positive for cough and shortness of breath.   ?Cardiovascular:  Positive for chest pain.  ?Musculoskeletal:  Positive for back pain.  ?All other systems reviewed and are negative. ? ?Physical Exam ?Updated Vital Signs ?BP 131/80   Pulse 96   Temp 99 ?F (37.2 ?C)   Resp 20   Ht 5\' 5"  (1.651 m)   Wt 63.5 kg   SpO2 98%   BMI 23.30 kg/m?  ?Physical Exam ?Vitals and nursing note reviewed.  ?Constitutional:   ?   General: She is not in acute distress. ?   Appearance: She is well-developed.  ?HENT:  ?   Head: Normocephalic and atraumatic.  ?Eyes:  ?   Conjunctiva/sclera: Conjunctivae normal.  ?Cardiovascular:  ?   Rate and Rhythm: Normal rate and regular rhythm.  ?Pulmonary:  ?   Effort: Pulmonary effort is normal. No respiratory distress.  ?   Breath sounds: Normal breath sounds.  ?Abdominal:  ?   Palpations: Abdomen is soft.  ?   Tenderness: There is abdominal tenderness.  ?   Comments: Generalized abdominal tenderness to palpation, no rebound or guarding  ?Musculoskeletal:     ?   General: No swelling.  ?   Cervical back: Neck supple.  ?Skin: ?   General: Skin is warm and dry.  ?   Capillary Refill: Capillary  refill takes less than 2 seconds.  ?Neurological:  ?   Mental Status: She is alert.  ?Psychiatric:     ?   Mood and Affect: Mood normal.  ? ? ?ED Results / Procedures / Treatments   ?Labs ?(all labs ordered are listed, but only abnormal results are displayed) ?Labs Reviewed  ?COMPREHENSIVE METABOLIC PANEL - Abnormal; Notable for the following components:  ?    Result Value  ? Sodium 133 (*)   ? Potassium 3.0 (*)   ? Chloride 96 (*)   ? Glucose, Bld 123 (*)   ? Albumin 3.1 (*)   ? Alkaline Phosphatase 156 (*)   ? All other components within normal limits  ?CBC WITH DIFFERENTIAL/PLATELET - Abnormal; Notable for the following components:  ? WBC 15.3 (*)   ?  Platelets 537 (*)   ? Neutro Abs 12.1 (*)   ? Abs Immature Granulocytes 0.17 (*)   ? All other components within normal limits  ?URINALYSIS, ROUTINE W REFLEX MICROSCOPIC - Abnormal; Notable for the following components:  ? Hgb urine dipstick TRACE (*)   ? All other components within normal limits  ?URINALYSIS, MICROSCOPIC (REFLEX) - Abnormal; Notable for the following components:  ? Bacteria, UA FEW (*)   ? All other components within normal limits  ?RESP PANEL BY RT-PCR (FLU A&B, COVID) [id number]  ?CULTURE, BLOOD (ROUTINE X 2)  ?CULTURE, BLOOD (ROUTINE X 2)  ?URINE CULTURE  ?LACTIC ACID, PLASMA  ?PROTIME-INR  ?APTT  ?MAGNESIUM  ? ? ?EKG ?EKG Interpretation ? ?Date/Time:  Saturday Apr 21 2022 11:40:24 EDT ?Ventricular Rate:  121 ?PR Interval:  162 ?QRS Duration: 98 ?QT Interval:  325 ?QTC Calculation: 462 ?R Axis:   -50 ?Text Interpretation: Sinus tachycardia LAD, consider left anterior fascicular block RSR' in V1 or V2, right VCD or RVH Left ventricular hypertrophy Borderline T abnormalities, anterior leads Confirmed by Ernie Avena ([id number]) on 04/21/2022 12:16:58 PM ? ?Radiology ?CT Angio Chest PE W and/or Wo Contrast ? ?Result Date: 04/21/2022 ?CLINICAL DATA:  Patient is getting weaker after diagnosis of pneumonia. Abdominal pain. EXAM: CT ANGIOGRAPHY CHEST CT ABDOMEN AND PELVIS WITH CONTRAST TECHNIQUE: Multidetector CT imaging of the chest was performed using the standard protocol during bolus administration of intravenous contrast. Multiplanar CT image reconstructions and MIPs were obtained to evaluate the vascular anatomy. Multidetector CT imaging of the abdomen and pelvis was performed using the standard protocol during bolus administration of intravenous contrast. RADIATION DOSE REDUCTION: This exam was performed according to the departmental dose-optimization program which includes automated exposure control, adjustment of the mA and/or kV according to patient size and/or use of iterative reconstruction  technique. CONTRAST:  OMNIPAQUE IOHEXOL 350 MG/ML SOLN COMPARISON:  Abdomen/pelvis CT 08/01/2020 FINDINGS: CTA CHEST FINDINGS Cardiovascular: Heart size upper normal to mildly increased. No pericardial effusion. Mild atherosclerotic calcification is noted in the wall of the thoracic aorta. There is no filling defect within the opacified pulmonary arteries to suggest the presence of an acute pulmonary embolus. Mediastinum/Nodes: No mediastinal lymphadenopathy. 11 mm short axis right hilar lymph node is upper normal to mildly increased for size. 12 mm short axis lymph node identified in the right hilum. Small hiatal hernia. The esophagus has normal imaging features. There is no axillary lymphadenopathy. Lungs/Pleura: Patchy areas of peripheral ground-glass opacity are noted in the lungs bilaterally. Patchy consolidative airspace disease noted posterior left lower lobe. No pneumothorax or pleural effusion. Musculoskeletal: No worrisome lytic or sclerotic osseous abnormality. Review of the MIP images confirms the  above findings. CT ABDOMEN and PELVIS FINDINGS Hepatobiliary: No suspicious focal abnormality within the liver parenchyma. There is no evidence for gallstones, gallbladder wall thickening, or pericholecystic fluid. No intrahepatic or extrahepatic biliary dilation. Pancreas: No focal mass lesion. No dilatation of the main duct. No intraparenchymal cyst. No peripancreatic edema. Spleen: No splenomegaly. No focal mass lesion. Adrenals/Urinary Tract: No adrenal nodule or mass. Stable cyst interpolar right kidney. 5 mm nonobstructing stone identified upper pole left kidney. No evidence for hydroureter. The urinary bladder appears normal for the degree of distention. Stomach/Bowel: Small hiatal hernia. Stomach otherwise unremarkable. Duodenum is normally positioned as is the ligament of Treitz. No small bowel wall thickening. No small bowel dilatation. The terminal ileum is normal. The appendix is not well  visualized, but there is no edema or inflammation in the region of the cecum. No gross colonic mass. No colonic wall thickening. Diverticular changes are noted in the left colon without evidence of diverticulitis. Vascular/Lymphat

## 2022-04-21 NOTE — Progress Notes (Signed)
Elink following for sepsis protocol. 

## 2022-04-21 NOTE — ED Triage Notes (Signed)
Seen and tx for pneu recently and pt states she is not feeling any better , states is weaker ?

## 2022-04-21 NOTE — Progress Notes (Signed)
Plan of Care Note for accepted transfer ? ? ?Patient: Shannon Wise MRN: [id number]   [place]: 04/21/2022 ? ?Facility requesting transfer: [facility] ?Requesting Provider: Dr. Armandina Gemma ?Reason for transfer: PNA ?Facility course: 88 yo with history of HLD. Presenting with weakness, cough, dyspnea. Recently Dx'd w/ PNA w/her PCP. Given doxy but has not responded. CTA w/ PNA. Started on rocephin, zithro. COVID/flu negative.  ? ?Plan of care: ?The patient is accepted for admission to [place]  unit, at [facility]..  ?While holding at [facility], medical decision making will remain the responsibility of the EDP. Upon arrival to [facility], [facility] will assume care.  ? ?Author: ?Jonnie Finner, DO ?04/21/2022 ? ?Check [email or web address] for on-call coverage. ? ?Nursing staff, Please call [place] number on Amion as soon as patient's arrival, so appropriate admitting provider can evaluate the pt. ? ?

## 2022-04-21 NOTE — ED Notes (Signed)
Pt placed on 2lnc. 

## 2022-04-21 NOTE — Progress Notes (Signed)
Pharmacy Antibiotic Note ? ?Shannon Wise is a 88 y.o. female admitted on 04/21/2022 with pneumonia.  Pharmacy has been consulted for Vancomycin dosing. ? ?Scr 0.74 (rounded to 0.8) ?Weight 63.5 kg ? ? ?Plan: ?Vancomycin 1250mg  LD x1 followed by vancomycin 1000mg  q24hr ?Plan to obtain levels at steady state if therapy continued  ?Will monitor for acute changes in renal function and adjust as needed ?F/u cultures results and de-escalate as appropriate ? ? ?Height: 5\' 5"  (165.1 cm) ?Weight: 63.5 kg (139 lb 15.9 oz) ?IBW/kg (Calculated) : 57 ? ?Temp (24hrs), Avg:99.3 ?F (37.4 ?C), Min:99 ?F (37.2 ?C), Max:99.6 ?F (37.6 ?C) ? ?Recent Labs  ?Lab 04/21/22 ?1145  ?WBC 15.3*  ?CREATININE 0.74  ?LATICACIDVEN 1.4  ?  ?Estimated Creatinine Clearance: 45.4 mL/min (by C-G formula based on SCr of 0.74 mg/dL).   ? ?Allergies  ?Allergen Reactions  ? Codeine   ? Levaquin [Levofloxacin] Other (See Comments)  ?  Abdominal pain  ? Red Dye   ? ? ?Thank you for allowing pharmacy to be a part of this patient?s care. ? ? , PharmD ?Clinical Pharmacist ? ?Please check AMION for all [facility] Pharmacy numbers ?After 10:00 PM, call Main Pharmacy [phone] ? ? ?

## 2022-04-21 NOTE — ED Notes (Signed)
Patients rr are increase ,but patient is denying any sob . Patient is breathing non labored ?

## 2022-04-21 NOTE — ED Notes (Signed)
Patient transported to CT 

## 2022-04-22 ENCOUNTER — Encounter (HOSPITAL_COMMUNITY): Payer: Self-pay | Admitting: Internal Medicine

## 2022-04-22 DIAGNOSIS — M1990 Unspecified osteoarthritis, unspecified site: Secondary | ICD-10-CM | POA: Diagnosis present

## 2022-04-22 DIAGNOSIS — Z79899 Other long term (current) drug therapy: Secondary | ICD-10-CM | POA: Diagnosis not present

## 2022-04-22 DIAGNOSIS — Z8673 Personal history of transient ischemic attack (TIA), and cerebral infarction without residual deficits: Secondary | ICD-10-CM | POA: Diagnosis not present

## 2022-04-22 DIAGNOSIS — Z885 Allergy status to narcotic agent status: Secondary | ICD-10-CM | POA: Diagnosis not present

## 2022-04-22 DIAGNOSIS — Z90710 Acquired absence of both cervix and uterus: Secondary | ICD-10-CM | POA: Diagnosis not present

## 2022-04-22 DIAGNOSIS — J189 Pneumonia, unspecified organism: Secondary | ICD-10-CM | POA: Diagnosis present

## 2022-04-22 DIAGNOSIS — E876 Hypokalemia: Secondary | ICD-10-CM | POA: Diagnosis present

## 2022-04-22 DIAGNOSIS — Z87891 Personal history of nicotine dependence: Secondary | ICD-10-CM | POA: Diagnosis not present

## 2022-04-22 DIAGNOSIS — E785 Hyperlipidemia, unspecified: Secondary | ICD-10-CM | POA: Diagnosis present

## 2022-04-22 DIAGNOSIS — Z20822 Contact with and (suspected) exposure to covid-19: Secondary | ICD-10-CM | POA: Diagnosis present

## 2022-04-22 DIAGNOSIS — H905 Unspecified sensorineural hearing loss: Secondary | ICD-10-CM | POA: Diagnosis present

## 2022-04-22 DIAGNOSIS — F419 Anxiety disorder, unspecified: Secondary | ICD-10-CM | POA: Diagnosis present

## 2022-04-22 DIAGNOSIS — G4730 Sleep apnea, unspecified: Secondary | ICD-10-CM | POA: Diagnosis present

## 2022-04-22 DIAGNOSIS — Z881 Allergy status to other antibiotic agents status: Secondary | ICD-10-CM | POA: Diagnosis not present

## 2022-04-22 LAB — COMPREHENSIVE METABOLIC PANEL
ALT: 18 U/L (ref 0–44)
AST: 17 U/L (ref 15–41)
Albumin: 2.9 g/dL — ABNORMAL LOW (ref 3.5–5.0)
Alkaline Phosphatase: 127 U/L — ABNORMAL HIGH (ref 38–126)
Anion gap: 9 (ref 5–15)
BUN: 6 mg/dL — ABNORMAL LOW (ref 8–23)
CO2: 26 mmol/L (ref 22–32)
Calcium: 8.9 mg/dL (ref 8.9–10.3)
Chloride: 101 mmol/L (ref 98–111)
Creatinine, Ser: 0.61 mg/dL (ref 0.44–1.00)
GFR, Estimated: >60 mL/min (ref >60)
Glucose, Bld: 104 mg/dL — ABNORMAL HIGH (ref 70–99)
Potassium: 3 mmol/L — ABNORMAL LOW (ref 3.5–5.1)
Sodium: 136 mmol/L (ref 135–145)
Total Bilirubin: 0.6 mg/dL (ref 0.3–1.2)
Total Protein: 6.7 g/dL (ref 6.5–8.1)

## 2022-04-22 LAB — CBC WITH DIFFERENTIAL/PLATELET
Abs Immature Granulocytes: 0.16 10*3/uL — ABNORMAL HIGH (ref 0.00–0.07)
Basophils Absolute: 0.1 10*3/uL (ref 0.0–0.1)
Basophils Relative: 1 %
Eosinophils Absolute: 0.1 10*3/uL (ref 0.0–0.5)
Eosinophils Relative: 1 %
HCT: 38 % (ref 36.0–46.0)
Hemoglobin: 13 g/dL (ref 12.0–15.0)
Immature Granulocytes: 1 %
Lymphocytes Relative: 13 %
Lymphs Abs: 1.7 10*3/uL (ref 0.7–4.0)
MCH: 31.7 pg (ref 26.0–34.0)
MCHC: 34.2 g/dL (ref 30.0–36.0)
MCV: 92.7 fL (ref 80.0–100.0)
Monocytes Absolute: 1.1 10*3/uL — ABNORMAL HIGH (ref 0.1–1.0)
Monocytes Relative: 8 %
Neutro Abs: 9.8 10*3/uL — ABNORMAL HIGH (ref 1.7–7.7)
Neutrophils Relative %: 76 %
Platelets: 589 10*3/uL — ABNORMAL HIGH (ref 150–400)
RBC: 4.1 MIL/uL (ref 3.87–5.11)
RDW: 13 % (ref 11.5–15.5)
WBC: 13 10*3/uL — ABNORMAL HIGH (ref 4.0–10.5)
nRBC: 0 % (ref 0.0–0.2)

## 2022-04-22 LAB — TROPONIN I (HIGH SENSITIVITY): Troponin I (High Sensitivity): 10 ng/L

## 2022-04-22 LAB — MRSA NEXT GEN BY PCR, NASAL: MRSA by PCR Next Gen: NOT DETECTED

## 2022-04-22 LAB — HIV ANTIBODY (ROUTINE TESTING W REFLEX): HIV Screen 4th Generation wRfx: NONREACTIVE

## 2022-04-22 MED ORDER — POTASSIUM CHLORIDE 10 MEQ/100ML IV SOLN
10.0000 meq | Freq: Once | INTRAVENOUS | Status: AC
  Administered 2022-04-22: 10 meq via INTRAVENOUS
  Filled 2022-04-22: qty 100

## 2022-04-22 MED ORDER — AZITHROMYCIN 250 MG PO TABS
500.0000 mg | ORAL_TABLET | Freq: Every day | ORAL | Status: DC
  Administered 2022-04-22 – 2022-04-23 (×2): 500 mg via ORAL
  Filled 2022-04-22 (×2): qty 2

## 2022-04-22 MED ORDER — POTASSIUM CHLORIDE 10 MEQ/100ML IV SOLN
10.0000 meq | INTRAVENOUS | Status: AC
  Administered 2022-04-22 (×2): 10 meq via INTRAVENOUS
  Filled 2022-04-22 (×2): qty 100

## 2022-04-22 MED ORDER — ACETAMINOPHEN 325 MG PO TABS: 650.0000 mg | ORAL_TABLET | Freq: Four times a day (QID) | ORAL | Status: DC | PRN

## 2022-04-22 MED ORDER — ACETAMINOPHEN 325 MG PO TABS
650.0000 mg | ORAL_TABLET | Freq: Four times a day (QID) | ORAL | Status: DC | PRN
  Filled 2022-04-22: qty 2

## 2022-04-22 MED ORDER — ACETAMINOPHEN 160 MG/5ML PO SOLN: 650.0000 mg | Freq: Four times a day (QID) | ORAL | Status: DC | PRN

## 2022-04-22 MED ORDER — ENOXAPARIN SODIUM 40 MG/0.4ML IJ SOSY
40.0000 mg | PREFILLED_SYRINGE | INTRAMUSCULAR | Status: DC
  Administered 2022-04-23: 40 mg via SUBCUTANEOUS
  Filled 2022-04-22 (×2): qty 0.4

## 2022-04-22 MED ORDER — LACTATED RINGERS IV SOLN: INTRAVENOUS | Status: DC

## 2022-04-22 NOTE — Progress Notes (Signed)
Seen and examined after midnight ? ?88 year old [email or web address] ?Known bilateral chronic osteoarthritis?  Sleep apnea and sensorineural hearing loss ?Last office visit 04/16/2022 some hoarseness 3 to 4 days-felt to be laryngitis by the primary care physician at that time ?Patient went to med [facility] 5/11 "not feeling well"-COVID and flu were negative CXR = LLL PNA-Rx doxycycline and sent home ?  ?Represent 5/13 [facility] feeling worse-WBC 15 hypokalemic Tmax 99.6 HR 121 BP 140/80-placed on 2 L [place] ?CT chest patchy groundglass opacities in lungs?  Confluent airspace disease mild hilar L LA and ?  ?Rx 1 L LR-Vanco-Rocephin-azithromycin ? ?S: Patient is extraordinarily hard of hearing-she is not demented-she understands that she is in the hospital-she is complaining that no one has given her "her Tylenol" she refuses the floor stock Tylenol and insists on Tylenol capsules from home ?She tells me her cough is a little better ? ? ?O: ?Blood pressure (!) 143/82, pulse 95, temperature 98.6 ?F (37 ?C), temperature source Oral, resp. rate 20, height 5\' 5"  (1.651 m), weight 63.5 kg, SpO2 93 %. ?Awake coherent pleasant no distress ?No icterus no pallor ?Bitemporal supraclavicular wasting ?Chest is clear no added sound wheeze rales ?S1-S2 no murmur ?Abdomen is soft no rebound guarding ? ?A/p ?Continue to treat community-acquired pneumonia-given she had laryngitis may be reasonable to get a respiratory viral pathogen panel-COVID flu are negative ?Called daughter/called son-no response on either number-left message on son's voicemail-we will attempt to call them again tomorrow ? ? , MD ?[facility] ?11:51 AM ? ?

## 2022-04-22 NOTE — H&P (Signed)
?History and Physical  ? ? ?Shannon Wise BSJ:[id number] DOB: [birth year] DOA: 04/21/2022 ? ?PCP: Loyola Mast, MD  ?Patient coming from: Home. ? ?Chief Complaint: Not feeling well. ? ?HPI: Shannon Wise is a 88 y.o. female with history of hyperlipidemia and hard of hearing was recently diagnosed with pneumonia was placed on oral doxycycline has benign worsening symptoms of cough and chest pain was brought to the ER.  Most of the history was obtained from ER physician and my colleague.  Patient is a poor historian.  Unable to reach patient's daughter at this time. ? ?ED Course: In the ER patient had CT angiogram of the chest abdomen pelvis which shows features concerning for pneumonia.  Labs show leukocytosis.  Patient was started on empiric antibiotics admitted for further observation. ? ?Review of Systems: As per HPI, rest all negative. ? ? ?Past Medical History:  ?Diagnosis Date  ? Anxiety   ? HOH (hard of hearing)   ? Migraine   ? Stroke [facility])   ? UTI (urinary tract infection)   ? ? ?Past Surgical History:  ?Procedure Laterality Date  ? ABDOMINAL HYSTERECTOMY    ? BLADDER SURGERY    ? ? ? reports that she has never smoked. She has quit using smokeless tobacco. She reports that she does not drink alcohol and does not use drugs. ? ?Allergies  ?Allergen Reactions  ? Codeine   ? Levaquin [Levofloxacin] Other (See Comments)  ?  Abdominal pain  ? Red Dye   ? ? ?History reviewed. No pertinent family history. ? ?Prior to Admission medications   ?Medication Sig Start Date End Date Taking? Authorizing Provider  ?acetaminophen (TYLENOL) 500 MG tablet Take 2 tablets (1,000 mg total) by mouth every 6 (six) hours as needed. 05/07/20   Arby Barrette, MD  ?amitriptyline (ELAVIL) 10 MG tablet Take 1 tablet (10 mg total) by mouth at bedtime. 11/06/21   Loyola Mast, MD  ?doxycycline (VIBRA-TABS) 100 MG tablet Take 1 tablet (100 mg total) by mouth 2 (two) times daily. 04/19/22   Derrel Nip, MD  ?simvastatin  (ZOCOR) 20 MG tablet Take 1 tablet (20 mg total) by mouth at bedtime. 11/15/21   Loyola Mast, MD  ?sulfamethoxazole-trimethoprim (BACTRIM DS) 800-160 MG tablet Take 1 tablet by mouth 2 (two) times daily. 01/29/22   Loyola Mast, MD  ?ZOLMitriptan (ZOMIG) 2.5 MG tablet Take 1 tablet (2.5 mg total) by mouth once for 1 dose. May repeat in 2 hours if headache persists or recurs. 11/06/21 04/16/22  Loyola Mast, MD  ?fluticasone (FLONASE) 50 MCG/ACT nasal spray 1 spray by Each Nare route daily for 10 days. 11/07/17 06/03/19  [provider]  ?loratadine (CLARITIN) 10 MG tablet Take by mouth. 11/07/17 06/14/19  [provider]  ? ? ?Physical Exam: ?Constitutional: Moderately built and nourished. ?Vitals:  ? 04/21/22 1746 04/21/22 1800 04/21/22 1830 04/21/22 2227  ?BP:  129/75 127/80 (!) 145/80  ?Pulse:  88 89 93  ?Resp:  (!) 26 (!) 26 18  ?Temp: 98.5 ?F (36.9 ?C)   98.4 ?F (36.9 ?C)  ?TempSrc: Oral   Oral  ?SpO2:  97% 97% 95%  ?Weight:      ?Height:      ? ?Eyes: Anicteric no pallor. ?ENMT: No discharge from the ears eyes nose and mouth. ?Neck: No mass felt.  No neck rigidity. ?Respiratory: No rhonchi or crepitations. ?Cardiovascular: S1-S2 heard. ?Abdomen: Soft nontender bowel sound present. ?Musculoskeletal: No edema. ?Skin: No rash. ?Neurologic:  Alert awake oriented time place and person.  Moves all extremities. ?Psychiatric: Appears normal.  Normal affect. ? ? ?Labs on Admission: I have personally reviewed following labs and imaging studies ? ?CBC: ?Recent Labs  ?Lab 04/21/22 ?1145  ?WBC 15.3*  ?NEUTROABS 12.1*  ?HGB 13.7  ?HCT 40.3  ?MCV 90.2  ?PLT 537*  ? ?Basic Metabolic Panel: ?Recent Labs  ?Lab 04/21/22 ?1145  ?NA 133*  ?K 3.0*  ?CL 96*  ?CO2 25  ?GLUCOSE 123*  ?BUN 11  ?CREATININE 0.74  ?CALCIUM 9.3  ?MG 2.0  ? ?GFR: ?Estimated Creatinine Clearance: 45.4 mL/min (by C-G formula based on SCr of 0.74 mg/dL). ?Liver Function Tests: ?Recent Labs  ?Lab 04/21/22 ?1145  ?AST 22  ?ALT 18  ?ALKPHOS  156*  ?BILITOT 0.6  ?PROT 7.7  ?ALBUMIN 3.1*  ? ?No results for input(s): LIPASE, AMYLASE in the last 168 hours. ?No results for input(s): AMMONIA in the last 168 hours. ?Coagulation Profile: ?Recent Labs  ?Lab 04/21/22 ?1145  ?INR 1.1  ? ?Cardiac Enzymes: ?No results for input(s): CKTOTAL, CKMB, CKMBINDEX, TROPONINI in the last 168 hours. ?BNP (last 3 results) ?No results for input(s): PROBNP in the last 8760 hours. ?HbA1C: ?No results for input(s): HGBA1C in the last 72 hours. ?CBG: ?No results for input(s): GLUCAP in the last 168 hours. ?Lipid Profile: ?No results for input(s): CHOL, HDL, LDLCALC, TRIG, CHOLHDL, LDLDIRECT in the last 72 hours. ?Thyroid Function Tests: ?No results for input(s): TSH, T4TOTAL, FREET4, T3FREE, THYROIDAB in the last 72 hours. ?Anemia Panel: ?No results for input(s): VITAMINB12, FOLATE, FERRITIN, TIBC, IRON, RETICCTPCT in the last 72 hours. ?Urine analysis: ?   ?Component Value Date/Time  ? COLORURINE YELLOW 04/21/2022 1411  ? APPEARANCEUR CLEAR 04/21/2022 1411  ? LABSPEC 1.010 04/21/2022 1411  ? PHURINE 7.0 04/21/2022 1411  ? GLUCOSEU NEGATIVE 04/21/2022 1411  ? HGBUR TRACE (A) 04/21/2022 1411  ? BILIRUBINUR NEGATIVE 04/21/2022 1411  ? KETONESUR NEGATIVE 04/21/2022 1411  ? PROTEINUR NEGATIVE 04/21/2022 1411  ? NITRITE NEGATIVE 04/21/2022 1411  ? LEUKOCYTESUR NEGATIVE 04/21/2022 1411  ? ?Sepsis Labs: ?@[email or web address] (procalcitonin:4,lacticidven:4) ?) ?Recent Results (from the past 240 hour(s))  ?Group A Strep by PCR     Status: None  ? Collection Time: 04/19/22 12:21 PM  ? Specimen: Throat; Sterile Swab  ?Result Value Ref Range Status  ? Group A Strep by PCR NOT DETECTED NOT DETECTED Final  ?  Comment: Performed at [facility], [address] [place], [place] [address]  ?Resp Panel by RT-PCR (Flu A&B, Covid) Throat     Status: None  ? Collection Time: 04/19/22 12:21 PM  ? Specimen: Throat; Nasopharyngeal(NP) swabs in vial transport medium  ?Result Value Ref Range Status  ? SARS  Coronavirus 2 by RT PCR NEGATIVE NEGATIVE Final  ?  Comment: (NOTE) ?SARS-CoV-2 target nucleic acids are NOT DETECTED. ? ?The SARS-CoV-2 RNA is generally detectable in upper respiratory ?specimens during the acute phase of infection. The lowest ?concentration of SARS-CoV-2 viral copies this assay can detect is ?138 copies/mL. A negative result does not preclude SARS-Cov-2 ?infection and should not be used as the sole basis for treatment or ?other patient management decisions. A negative result may occur with  ?improper specimen collection/handling, submission of specimen other ?than nasopharyngeal swab, presence of viral mutation(s) within the ?areas targeted by this assay, and inadequate number of viral ?copies(<138 copies/mL). A negative result must be combined with ?clinical observations, patient history, and epidemiological ?information. The expected result is Negative. ? ?Fact  Sheet for Patients:  ?BloggerCourse.com ? ?Fact Sheet for Healthcare Providers:  ?SeriousBroker.it ? ?This test is no t yet approved or cleared by the [place] FDA and  ?has been authorized for detection and/or diagnosis of SARS-CoV-2 by ?FDA under an Emergency Use Authorization (EUA). This EUA will remain  ?in effect (meaning this test can be used) for the duration of the ?COVID-19 declaration under Section 564(b)(1) of the Act, 21 ?U.S.C.section 360bbb-3(b)(1), unless the authorization is terminated  ?or revoked sooner.  ? ? ?  ? Influenza A by PCR NEGATIVE NEGATIVE Final  ? Influenza B by PCR NEGATIVE NEGATIVE Final  ?  Comment: (NOTE) ?The Xpert Xpress SARS-CoV-2/FLU/RSV plus assay is intended as an aid ?in the diagnosis of influenza from Nasopharyngeal swab specimens and ?should not be used as a sole basis for treatment. Nasal washings and ?aspirates are unacceptable for Xpert Xpress SARS-CoV-2/FLU/RSV ?testing. ? ?Fact Sheet for  Patients: ?BloggerCourse.com ? ?Fact Sheet for Healthcare Providers: ?SeriousBroker.it ? ?This test is not yet approved or cleared by the [place] and ?has been authorized for detection and/or diagnos

## 2022-04-23 DIAGNOSIS — J189 Pneumonia, unspecified organism: Secondary | ICD-10-CM | POA: Diagnosis not present

## 2022-04-23 LAB — COMPREHENSIVE METABOLIC PANEL
ALT: 18 U/L (ref 0–44)
AST: 17 U/L (ref 15–41)
Albumin: 2.8 g/dL — ABNORMAL LOW (ref 3.5–5.0)
Alkaline Phosphatase: 105 U/L (ref 38–126)
Anion gap: 10 (ref 5–15)
BUN: 6 mg/dL — ABNORMAL LOW (ref 8–23)
CO2: 24 mmol/L (ref 22–32)
Calcium: 8.8 mg/dL — ABNORMAL LOW (ref 8.9–10.3)
Chloride: 103 mmol/L (ref 98–111)
Creatinine, Ser: 0.47 mg/dL (ref 0.44–1.00)
GFR, Estimated: >60 mL/min (ref >60)
Glucose, Bld: 103 mg/dL — ABNORMAL HIGH (ref 70–99)
Potassium: 3.3 mmol/L — ABNORMAL LOW (ref 3.5–5.1)
Sodium: 137 mmol/L (ref 135–145)
Total Bilirubin: 0.6 mg/dL (ref 0.3–1.2)
Total Protein: 6.5 g/dL (ref 6.5–8.1)

## 2022-04-23 LAB — RESPIRATORY PANEL BY PCR

## 2022-04-23 LAB — CBC
HCT: 38.8 % (ref 36.0–46.0)
Hemoglobin: 12.6 g/dL (ref 12.0–15.0)
MCH: 30.4 pg (ref 26.0–34.0)
MCHC: 32.5 g/dL (ref 30.0–36.0)
MCV: 93.7 fL (ref 80.0–100.0)
Platelets: 644 10*3/uL — ABNORMAL HIGH (ref 150–400)
RBC: 4.14 MIL/uL (ref 3.87–5.11)
RDW: 13.2 % (ref 11.5–15.5)
WBC: 13.4 10*3/uL — ABNORMAL HIGH (ref 4.0–10.5)
nRBC: 0 % (ref 0.0–0.2)

## 2022-04-23 LAB — URINE CULTURE: Culture: NO GROWTH

## 2022-04-23 LAB — STREP PNEUMONIAE URINARY ANTIGEN: Strep Pneumo Urinary Antigen: NEGATIVE

## 2022-04-23 MED ORDER — POTASSIUM CHLORIDE CRYS ER 20 MEQ PO TBCR: 40.0000 meq | EXTENDED_RELEASE_TABLET | Freq: Two times a day (BID) | ORAL | Status: DC

## 2022-04-23 MED ORDER — ENOXAPARIN SODIUM 40 MG/0.4ML IJ SOSY: 40.0000 mg | PREFILLED_SYRINGE | Freq: Every day | INTRAMUSCULAR | Status: DC

## 2022-04-23 MED ORDER — POTASSIUM CHLORIDE CRYS ER 20 MEQ PO TBCR
40.0000 meq | EXTENDED_RELEASE_TABLET | Freq: Two times a day (BID) | ORAL | Status: DC
  Administered 2022-04-23 – 2022-04-24 (×2): 40 meq via ORAL
  Filled 2022-04-23 (×2): qty 2

## 2022-04-23 MED ORDER — ENOXAPARIN SODIUM 40 MG/0.4ML IJ SOSY
40.0000 mg | PREFILLED_SYRINGE | Freq: Every day | INTRAMUSCULAR | Status: DC
  Administered 2022-04-24: 40 mg via SUBCUTANEOUS
  Filled 2022-04-23: qty 0.4

## 2022-04-23 MED ORDER — CEFADROXIL 500 MG PO CAPS
500.0000 mg | ORAL_CAPSULE | Freq: Two times a day (BID) | ORAL | Status: DC
  Administered 2022-04-23 – 2022-04-24 (×3): 500 mg via ORAL
  Filled 2022-04-23 (×3): qty 1

## 2022-04-23 NOTE — Progress Notes (Signed)
?PROGRESS NOTE ? ? ?Shannon Wise  [id number] DOB: Mar 29, 1934 DOA: 04/21/2022 ?PCP: Haydee Salter, MD  ?Brief Narrative:  ?88 year old home dwell Wf ?Known bilateral chronic osteoarthritis?  Sleep apnea and sensorineural hearing loss ?Last office visit 04/16/2022 some hoarseness 3 to 4 days-felt to be laryngitis by the primary care physician at that time ?Patient went to [place] 5/11 "not feeling well"-COVID and flu were negative CXR = LLL PNA-Rx doxycycline and sent home ?  ?Represent 5/13 [facility] HP feeling worse-WBC 15 hypokalemic Tmax 99.6 HR 121 BP 140/80-placed on 2 L [place] ?CT chest patchy groundglass opacities in lungs?  Confluent airspace disease mild hilar L LA and ?  ?Rx 1 L LR-Vanco-Rocephin-azithromycin ? ?Hospital-Problem based course ? ?Viral versus community-acquired pneumonia ?Overall looks improved ?Repeat labs not emergently this morning, get RVP, narrow rapidly off of IV azithromycin ceftriaxone and vancomycin to cefadroxil 500 twice daily ?Patient is ambulating in room-up with mobility tech later today ?If all remains stable can consider discharge home 5/16 a.m. ?Profuse and profound sensorineural hearing loss ?Very difficult to communicate with patient-she is not demented. ?Outpatient audiology follow-up ?Hypokalemia ?Potassium was low on 5/14 repeat labs today replace not emergently ?Osteoarthritis ?Sleep apnea--- outpatient follow-up-May need retesting with split-night study, outpatient stop bang [place] etc. ? ? ?DVT prophylaxis: Lovenox ?Code Status: Full ?Family Communication: I tried to call the patient's family several x5/14--I caleld son today Posey Boyer [phone] and didn't reach him ?Disposition:  ?Status is: Inpatient ?Remains inpatient appropriate because:  ? ?De-escalating antibiotics today and need to see result of this ?Likely can discharge in a.m. ?  ?Consultants:  ? ? ?Procedures:  ? ?Antimicrobials: Currently cefadroxil ? ? ?Subjective: ? ?Awake coherent no  distress ?No wheeze rales rhonchi ? ? ?Objective: ?Vitals:  ? 04/22/22 1030 04/22/22 1438 04/22/22 2016 04/23/22 0503  ?BP: (!) 143/82 125/82 127/71 131/76  ?Pulse: 95 88 79 83  ?Resp: 20 18 18 18   ?Temp: 98.6 ?F (37 ?C) 98.2 ?F (36.8 ?C) 98.3 ?F (36.8 ?C) 97.6 ?F (36.4 ?C)  ?TempSrc: Oral Oral Oral Oral  ?SpO2: 93% 94% 96% 94%  ?Weight:      ?Height:      ? ? ?Intake/Output Summary (Last 24 hours) at 04/23/2022 0950 ?Last data filed at 04/23/2022 0810 ?Gross per 24 hour  ?Intake 1500 ml  ?Output --  ?Net 1500 ml  ? ?Filed Weights  ? 04/21/22 1124  ?Weight: 63.5 kg  ? ? ?Examination: ? ?Pleasant awake no distress ?Neck soft supple ?Chest has some rales on the left posterior aspect of the lung fields-no wheeze no crepitations ?Abdomen soft no rebound no guarding ?ROM is intact ?Neuro intact 5/5 power ? ? ?Data Reviewed: personally reviewed  ? ?CBC ?   ?Component Value Date/Time  ? WBC 13.0 (H) 04/22/2022 0237  ? RBC 4.10 04/22/2022 0237  ? HGB 13.0 04/22/2022 0237  ? HCT 38.0 04/22/2022 0237  ? PLT 589 (H) 04/22/2022 0237  ? MCV 92.7 04/22/2022 0237  ? MCH 31.7 04/22/2022 0237  ? MCHC 34.2 04/22/2022 0237  ? RDW 13.0 04/22/2022 0237  ? LYMPHSABS 1.7 04/22/2022 0237  ? MONOABS 1.1 (H) 04/22/2022 0237  ? EOSABS 0.1 04/22/2022 0237  ? BASOSABS 0.1 04/22/2022 0237  ? ? ?  Latest Ref Rng & Units 04/22/2022  ?  2:37 AM 04/21/2022  ? 11:45 AM 01/24/2022  ? 12:02 PM  ?CMP  ?Glucose 70 - 99 mg/dL 104   123   87    ?  BUN 8 - 23 mg/dL 6   11   14     ?Creatinine 0.44 - 1.00 mg/dL 0.61   0.74   0.89    ?Sodium 135 - 145 mmol/L 136   133   139    ?Potassium 3.5 - 5.1 mmol/L 3.0   3.0   3.7    ?Chloride 98 - 111 mmol/L 101   96   102    ?CO2 22 - 32 mmol/L 26   25   30     ?Calcium 8.9 - 10.3 mg/dL 8.9   9.3   10.3    ?Total Protein 6.5 - 8.1 g/dL 6.7   7.7   8.0    ?Total Bilirubin 0.3 - 1.2 mg/dL 0.6   0.6   0.4    ?Alkaline Phos 38 - 126 U/L 127   156   82    ?AST 15 - 41 U/L 17   22   16     ?ALT 0 - 44 U/L 18   18   11      ? ? ? ?Radiology Studies: ?CT Angio Chest PE W and/or Wo Contrast ? ?Result Date: 04/21/2022 ?CLINICAL DATA:  Patient is getting weaker after diagnosis of pneumonia. Abdominal pain. EXAM: CT ANGIOGRAPHY CHEST CT ABDOMEN AND PELVIS WITH CONTRAST TECHNIQUE: Multidetector CT imaging of the chest was performed using the standard protocol during bolus administration of intravenous contrast. Multiplanar CT image reconstructions and MIPs were obtained to evaluate the vascular anatomy. Multidetector CT imaging of the abdomen and pelvis was performed using the standard protocol during bolus administration of intravenous contrast. RADIATION DOSE REDUCTION: This exam was performed according to the departmental dose-optimization program which includes automated exposure control, adjustment of the mA and/or kV according to patient size and/or use of iterative reconstruction technique. CONTRAST:  158mL OMNIPAQUE IOHEXOL 350 MG/ML SOLN COMPARISON:  Abdomen/pelvis CT 08/01/2020 FINDINGS: CTA CHEST FINDINGS Cardiovascular: Heart size upper normal to mildly increased. No pericardial effusion. Mild atherosclerotic calcification is noted in the wall of the thoracic aorta. There is no filling defect within the opacified pulmonary arteries to suggest the presence of an acute pulmonary embolus. Mediastinum/Nodes: No mediastinal lymphadenopathy. 11 mm short axis right hilar lymph node is upper normal to mildly increased for size. 12 mm short axis lymph node identified in the right hilum. Small hiatal hernia. The esophagus has normal imaging features. There is no axillary lymphadenopathy. Lungs/Pleura: Patchy areas of peripheral ground-glass opacity are noted in the lungs bilaterally. Patchy consolidative airspace disease noted posterior left lower lobe. No pneumothorax or pleural effusion. Musculoskeletal: No worrisome lytic or sclerotic osseous abnormality. Review of the MIP images confirms the above findings. CT ABDOMEN and PELVIS  FINDINGS Hepatobiliary: No suspicious focal abnormality within the liver parenchyma. There is no evidence for gallstones, gallbladder wall thickening, or pericholecystic fluid. No intrahepatic or extrahepatic biliary dilation. Pancreas: No focal mass lesion. No dilatation of the main duct. No intraparenchymal cyst. No peripancreatic edema. Spleen: No splenomegaly. No focal mass lesion. Adrenals/Urinary Tract: No adrenal nodule or mass. Stable cyst interpolar right kidney. 5 mm nonobstructing stone identified upper pole left kidney. No evidence for hydroureter. The urinary bladder appears normal for the degree of distention. Stomach/Bowel: Small hiatal hernia. Stomach otherwise unremarkable. Duodenum is normally positioned as is the ligament of Treitz. No small bowel wall thickening. No small bowel dilatation. The terminal ileum is normal. The appendix is not well visualized, but there is no edema or inflammation in the region of the cecum.  No gross colonic mass. No colonic wall thickening. Diverticular changes are noted in the left colon without evidence of diverticulitis. Vascular/Lymphatic: There is moderate atherosclerotic calcification of the abdominal aorta without aneurysm. There is no gastrohepatic or hepatoduodenal ligament lymphadenopathy. No retroperitoneal or mesenteric lymphadenopathy. No pelvic sidewall lymphadenopathy. Reproductive: The uterus is surgically absent. There is no adnexal mass. Other: No intraperitoneal free fluid. Musculoskeletal: No worrisome lytic or sclerotic osseous abnormality. IMPRESSION: 1. No CT evidence for acute pulmonary embolus. 2. Patchy areas of peripheral ground-glass opacity in the lungs bilaterally with more confluent airspace disease in the posterior left lower lobe. Imaging features can be seen in the setting of viral pneumonia, though are nonspecific and can occur with a variety of infectious and noninfectious processes. 3. Mild hilar lymphadenopathy, likely reactive.  4. Small hiatal hernia. 5. Left colonic diverticulosis without diverticulitis. 6. 5 mm nonobstructing stone upper pole left kidney. 7. Aortic Atherosclerosis (ICD10-I70.0). Electronically Signed   By: Verda Cumins.D.

## 2022-04-24 DIAGNOSIS — J189 Pneumonia, unspecified organism: Secondary | ICD-10-CM | POA: Diagnosis not present

## 2022-04-24 LAB — LEGIONELLA PNEUMOPHILA SEROGP 1 UR AG: L. pneumophila Serogp 1 Ur Ag: NEGATIVE

## 2022-04-24 MED ORDER — NAPROXEN 500 MG PO TABS: 500.0000 mg | ORAL_TABLET | Freq: Two times a day (BID) | ORAL | 0 refills | Status: DC

## 2022-04-24 MED ORDER — NAPROXEN 500 MG PO TABS
500.0000 mg | ORAL_TABLET | Freq: Two times a day (BID) | ORAL | Status: DC
  Administered 2022-04-24: 500 mg via ORAL
  Filled 2022-04-24: qty 1

## 2022-04-24 MED ORDER — TRAMADOL HCL 50 MG PO TABS
50.0000 mg | ORAL_TABLET | Freq: Once | ORAL | Status: DC
  Filled 2022-04-24: qty 1

## 2022-04-24 MED ORDER — CEFADROXIL 500 MG PO CAPS
500.0000 mg | ORAL_CAPSULE | Freq: Two times a day (BID) | ORAL | 0 refills | Status: AC
Start: 2022-04-24 — End: 2022-04-27

## 2022-04-24 NOTE — Progress Notes (Signed)
?  Progress Note  ? ?Date: 04/24/2022 ? ?Patient Name: Shannon Wise        ?MRN#: [id number] ? ? ?The diagnosis of sepsis   ? ?was ruled out  ? ? ? ? ?

## 2022-04-24 NOTE — Discharge Summary (Signed)
Physician Discharge Summary  ?Shannon Wise [id number] DOB: 1934/02/25 DOA: 04/21/2022 ? ?PCP: Haydee Salter, MD ? ?Admit date: 04/21/2022 ?Discharge date: 04/24/2022 ? ?Time spent: 36 minutes ? ?Recommendations for Outpatient Follow-up:  ?Finish antibiotics and repeat chest x-ray in 1 month ?Labs in 1 week ?Outpatient follow-up of osteoarthritis ? ?Discharge Diagnoses:  ?MAIN problem for hospitalization  ? ?Failed outpatient treatment of community-acquired pneumonia ? ?Please see below for itemized issues addressed in HOpsital- ?refer to other progress notes for clarity if needed ? ?Discharge Condition: Improved ? ?Diet recommendation: Heart healthy ? ?Filed Weights  ? 04/21/22 1124  ?Weight: 63.5 kg  ? ? ?History of present illness:  ?88 year old home dwell Wf ?Known bilateral chronic osteoarthritis?  Sleep apnea and sensorineural hearing loss ?Last office visit 04/16/2022 some hoarseness 3 to 4 days-felt to be laryngitis by the primary care physician at that time ?Patient went to [place] 5/11 "not feeling well"-COVID and flu were negative CXR = LLL PNA-Rx doxycycline and sent home ?  ?Represent 5/13 [facility] feeling worse-WBC 15 hypokalemic Tmax 99.6 HR 121 BP 140/80-placed on 2 L [place] ?CT chest patchy groundglass opacities in lungs?  Confluent airspace disease mild hilar L LA and ?  ?Rx 1 L LR-Vanco-Rocephin-azithromycin  ?transitioned rapidly to oral meds ? ? ?Hospital Course:  ?Failed outpatient response to community-acquired pneumonia ? IV azithromycin ceftriaxone and vancomycin was changed to cefadroxil 500 twice daily ?Her respiratory viral panel was negative effectively ruling out viral pneumonia ?She was stabilizing well on discharge ?Profuse and profound sensorineural hearing loss ?Very difficult to communicate with patient-she is not demented. ?Outpatient audiology follow-up ?Hypokalemia ?Potassium was low on 5/14 repeat labs today replace not emergently ?Osteoarthritis ?Prescribe some  Naprosyn for her legs ?Sleep apnea--- outpatient follow-up-May need retesting with split-night study, outpatient stop bang [place] etc.  ? ? ?Procedures: ? ?Discharge Exam: ?Vitals:  ? 04/23/22 2107 04/24/22 [id number]  ?BP: (!) 155/76 (!) 149/81  ?Pulse: 86 86  ?Resp: 18 18  ?Temp: 97.6 ?F (36.4 ?C) 98.1 ?F (36.7 ?C)  ?SpO2: 96% 93%  ? ? ?Subj on day of d/c ?  ?Awake coherent complaining of some pain in her knee which responded well to Naprosyn ? ?General Exam on discharge ? ?EOMI NCAT no focal deficit chest clear no rales rhonchi or wheeze ?Abdomen soft no rebound no guarding ?ROM intact ?She does have some difficulty in one of the knees ?Straight leg raise however is negative ? ?Discharge Instructions ? ? ?Discharge Instructions   ? ? Diet - low sodium heart healthy   Complete by: As directed ?  ? Discharge instructions   Complete by: As directed ?  ? Finish antibiotics for pneumonia ?Get an xray of chest in 3 weeks ?Follow up with your regular Doc in 1 week and get labs ?Best of luck and have a happy summer  ? Increase activity slowly   Complete by: As directed ?  ? ?  ? ?Allergies as of 04/24/2022   ? ?   Reactions  ? Codeine   ? Levaquin [levofloxacin] Other (See Comments)  ? Abdominal pain  ? Red Dye   ? ?  ? ?  ?Medication List  ?  ? ?STOP taking these medications   ? ?doxycycline 100 MG tablet ?Commonly known as: VIBRA-TABS ?  ?EXCEDRIN EXTRA STRENGTH PO ?  ? ?  ? ?TAKE these medications   ? ?acetaminophen 500 MG tablet ?Commonly known as: TYLENOL ?Take 2 tablets (1,000 mg  total) by mouth every 6 (six) hours as needed. ?What changed: reasons to take this ?  ?cefadroxil 500 MG capsule ?Commonly known as: DURICEF ?Take 1 capsule (500 mg total) by mouth 2 (two) times daily for 3 days. ?  ?naproxen 500 MG tablet ?Commonly known as: NAPROSYN ?Take 1 tablet (500 mg total) by mouth 2 (two) times daily with a meal. ?  ?simvastatin 20 MG tablet ?Commonly known as: ZOCOR ?Take 1 tablet (20 mg total) by mouth at bedtime. ?   ? ?  ? ?Allergies  ?Allergen Reactions  ? Codeine   ? Levaquin [Levofloxacin] Other (See Comments)  ?  Abdominal pain  ? Red Dye   ? ? ? ? ?The results of significant diagnostics from this hospitalization (including imaging, microbiology, ancillary and laboratory) are listed below for reference.   ? ?Significant Diagnostic Studies: ?CT Angio Chest PE W and/or Wo Contrast ? ?Result Date: 04/21/2022 ?CLINICAL DATA:  Patient is getting weaker after diagnosis of pneumonia. Abdominal pain. EXAM: CT ANGIOGRAPHY CHEST CT ABDOMEN AND PELVIS WITH CONTRAST TECHNIQUE: Multidetector CT imaging of the chest was performed using the standard protocol during bolus administration of intravenous contrast. Multiplanar CT image reconstructions and MIPs were obtained to evaluate the vascular anatomy. Multidetector CT imaging of the abdomen and pelvis was performed using the standard protocol during bolus administration of intravenous contrast. RADIATION DOSE REDUCTION: This exam was performed according to the departmental dose-optimization program which includes automated exposure control, adjustment of the mA and/or kV according to patient size and/or use of iterative reconstruction technique. CONTRAST:  182mL OMNIPAQUE IOHEXOL 350 MG/ML SOLN COMPARISON:  Abdomen/pelvis CT 08/01/2020 FINDINGS: CTA CHEST FINDINGS Cardiovascular: Heart size upper normal to mildly increased. No pericardial effusion. Mild atherosclerotic calcification is noted in the wall of the thoracic aorta. There is no filling defect within the opacified pulmonary arteries to suggest the presence of an acute pulmonary embolus. Mediastinum/Nodes: No mediastinal lymphadenopathy. 11 mm short axis right hilar lymph node is upper normal to mildly increased for size. 12 mm short axis lymph node identified in the right hilum. Small hiatal hernia. The esophagus has normal imaging features. There is no axillary lymphadenopathy. Lungs/Pleura: Patchy areas of peripheral  ground-glass opacity are noted in the lungs bilaterally. Patchy consolidative airspace disease noted posterior left lower lobe. No pneumothorax or pleural effusion. Musculoskeletal: No worrisome lytic or sclerotic osseous abnormality. Review of the MIP images confirms the above findings. CT ABDOMEN and PELVIS FINDINGS Hepatobiliary: No suspicious focal abnormality within the liver parenchyma. There is no evidence for gallstones, gallbladder wall thickening, or pericholecystic fluid. No intrahepatic or extrahepatic biliary dilation. Pancreas: No focal mass lesion. No dilatation of the main duct. No intraparenchymal cyst. No peripancreatic edema. Spleen: No splenomegaly. No focal mass lesion. Adrenals/Urinary Tract: No adrenal nodule or mass. Stable cyst interpolar right kidney. 5 mm nonobstructing stone identified upper pole left kidney. No evidence for hydroureter. The urinary bladder appears normal for the degree of distention. Stomach/Bowel: Small hiatal hernia. Stomach otherwise unremarkable. Duodenum is normally positioned as is the ligament of Treitz. No small bowel wall thickening. No small bowel dilatation. The terminal ileum is normal. The appendix is not well visualized, but there is no edema or inflammation in the region of the cecum. No gross colonic mass. No colonic wall thickening. Diverticular changes are noted in the left colon without evidence of diverticulitis. Vascular/Lymphatic: There is moderate atherosclerotic calcification of the abdominal aorta without aneurysm. There is no gastrohepatic or hepatoduodenal ligament lymphadenopathy. No retroperitoneal  or mesenteric lymphadenopathy. No pelvic sidewall lymphadenopathy. Reproductive: The uterus is surgically absent. There is no adnexal mass. Other: No intraperitoneal free fluid. Musculoskeletal: No worrisome lytic or sclerotic osseous abnormality. IMPRESSION: 1. No CT evidence for acute pulmonary embolus. 2. Patchy areas of peripheral ground-glass  opacity in the lungs bilaterally with more confluent airspace disease in the posterior left lower lobe. Imaging features can be seen in the setting of viral pneumonia, though are nonspecific and can occur with a var

## 2022-04-26 LAB — CULTURE, BLOOD (ROUTINE X 2)
Culture: NO GROWTH
Culture: NO GROWTH
Special Requests: ADEQUATE

## 2022-05-04 ENCOUNTER — Ambulatory Visit (INDEPENDENT_AMBULATORY_CARE_PROVIDER_SITE_OTHER): Payer: Medicare HMO | Admitting: Family Medicine

## 2022-05-04 VITALS — BP 110/68 | HR 105 | Temp 97.1°F | Ht 65.0 in | Wt 144.2 lb

## 2022-05-04 DIAGNOSIS — J189 Pneumonia, unspecified organism: Secondary | ICD-10-CM | POA: Diagnosis not present

## 2022-05-04 MED ORDER — AMOXICILLIN-POT CLAVULANATE 875-125 MG PO TABS: 1.0000 | ORAL_TABLET | Freq: Two times a day (BID) | ORAL | 0 refills | Status: DC

## 2022-05-04 MED ORDER — DOXYCYCLINE HYCLATE 100 MG PO TABS: 100.0000 mg | ORAL_TABLET | Freq: Two times a day (BID) | ORAL | 0 refills | Status: DC

## 2022-05-04 MED ORDER — NAPROXEN 500 MG PO TABS: 500.0000 mg | ORAL_TABLET | Freq: Two times a day (BID) | ORAL | 0 refills | Status: DC

## 2022-05-04 NOTE — Progress Notes (Signed)
[facility] [address] [place] [address] Dept: [phone] Dept Fax: [phone]  Office Visit  Subjective:    Patient ID: Shannon Wise, female    DOB: [birth year], 88 y.o..   MRN: [id number]  Chief Complaint  Patient presents with   Hospitalization Follow-up    Hospital f/u.     History of Present Illness:  Patient is in today for hospital follow-up. Shannon Wise was admitted to [facility] from 5/13-5/16/2023 with left lower lobe pneumonia. She was treated with IV antibiotics and discharged on cefadroxil. Since returning home, her breathing has improved. However, she has noted more pain in her back recently.  Notes that she used to use Tylenol ES 2 tabs twice a day, but these seem not to be working well more recently. Also, she notes that her stomach has been upset on the cefadroxil. She did not continue the antibiotic.  Past Medical History: Patient Active Problem List   Diagnosis Date Noted   CAP (community acquired pneumonia) 04/22/2022   PNA (pneumonia) 04/21/2022   Left upper arm pain 05/03/2021   Cerebrovascular disease 05/03/2021   Diverticulosis 05/03/2021   Aortic atherosclerosis (HCC) 05/03/2021   Cataracts, bilateral 05/03/2021   Perennial allergic rhinitis with seasonal variation 06/04/2020   Chronic UTI (urinary tract infection) 06/04/2020   Chronic mixed headache syndrome 06/04/2020   Primary osteoarthritis of both knees 05/17/2020   History of kidney stones 07/11/2018   Sleep apnea 11/03/2016   Generalized anxiety disorder 11/03/2016   TMJ (temporomandibular joint disorder) 10/11/2016   PVC (premature ventricular contraction) 03/16/2016   Palpitations 03/16/2016   Sensorineural hearing loss (SNHL), bilateral 01/04/2016   Facet arthropathy 12/29/2014   DDD (degenerative disc disease), lumbar 12/29/2014   Cervical spine fracture (HCC) 03/25/2014   Past Surgical History:  Procedure Laterality Date    ABDOMINAL HYSTERECTOMY     BLADDER SURGERY     No family history on file.  Outpatient Medications Prior to Visit  Medication Sig Dispense Refill   acetaminophen (TYLENOL) 500 MG tablet Take 2 tablets (1,000 mg total) by mouth every 6 (six) hours as needed. (Patient taking differently: Take 1,000 mg by mouth every 6 (six) hours as needed for moderate pain.) 30 tablet 0   naproxen (NAPROSYN) 500 MG tablet Take 1 tablet (500 mg total) by mouth 2 (two) times daily with a meal. 30 tablet 0   simvastatin (ZOCOR) 20 MG tablet Take 1 tablet (20 mg total) by mouth at bedtime. 90 tablet 3   cefadroxil (DURICEF) 500 MG/5ML suspension Take by mouth.     No facility-administered medications prior to visit.   Allergies  Allergen Reactions   Codeine    Levaquin [Levofloxacin] Other (See Comments)    Abdominal pain   Red Dye    Objective:   Today's Vitals   05/04/22 1402  BP: 110/68  Pulse: (!) 105  Temp: (!) 97.1 F (36.2 C)  TempSrc: Temporal  SpO2: 95%  Weight: 144 lb 3.2 oz (65.4 kg)  Height: 5\' 5"  (1.651 m)   Body mass index is 24 kg/m.   General: Well developed, well nourished. No acute distress. Lungs: Bibasilar rhonchi. No wheezing. Normal respiratory rate. Psych: Alert and oriented. Normal mood and affect.  Health Maintenance Due  Topic Date Due   COVID-19 Vaccine (1) Never done   Zoster Vaccines- Shingrix (1 of 2) Never done     Assessment & Plan:   1. Pneumonia of left lower lobe due to  infectious organism Reviewed discharge summary and chest x-ray results. Shannon Wise asked about a follow-up chest x-ray, but I explained that it was too soon to do this. I will switch her antibiotics, since she is not tolerating the cephalosporin. I will check her again 1 month.  - amoxicillin-clavulanate (AUGMENTIN) 875-125 MG tablet; Take 1 tablet by mouth 2 (two) times daily.  Dispense: 20 tablet; Refill: 0 - doxycycline (VIBRA-TABS) 100 MG tablet; Take 1 tablet (100 mg  total) by mouth 2 (two) times daily.  Dispense: 20 tablet; Refill: 0  Return in about 4 weeks (around 06/01/2022) for Reassessment.   Loyola Mast, MD

## 2022-05-18 ENCOUNTER — Other Ambulatory Visit: Payer: Self-pay | Admitting: Family Medicine

## 2022-05-18 DIAGNOSIS — J189 Pneumonia, unspecified organism: Secondary | ICD-10-CM

## 2022-06-01 ENCOUNTER — Ambulatory Visit (INDEPENDENT_AMBULATORY_CARE_PROVIDER_SITE_OTHER): Payer: Medicare HMO

## 2022-06-01 ENCOUNTER — Ambulatory Visit (INDEPENDENT_AMBULATORY_CARE_PROVIDER_SITE_OTHER): Payer: Medicare HMO | Admitting: Family Medicine

## 2022-06-01 VITALS — BP 120/78 | HR 83 | Temp 97.0°F | Ht 65.0 in | Wt 151.0 lb

## 2022-06-01 DIAGNOSIS — J189 Pneumonia, unspecified organism: Secondary | ICD-10-CM

## 2022-06-01 DIAGNOSIS — Z8701 Personal history of pneumonia (recurrent): Secondary | ICD-10-CM | POA: Diagnosis not present

## 2022-06-01 NOTE — Progress Notes (Unsigned)
F/u visit. For reassessment of pneumonia dx in May 2023. She is no longer having a cough and feels her energy is almost back to baseline

## 2022-06-28 ENCOUNTER — Encounter: Payer: Self-pay | Admitting: Nurse Practitioner

## 2022-06-28 ENCOUNTER — Ambulatory Visit (INDEPENDENT_AMBULATORY_CARE_PROVIDER_SITE_OTHER): Payer: Medicare HMO | Admitting: Nurse Practitioner

## 2022-06-28 VITALS — BP 126/82 | HR 96 | Temp 97.1°F | Ht 65.0 in | Wt 145.2 lb

## 2022-06-28 DIAGNOSIS — G4489 Other headache syndrome: Secondary | ICD-10-CM

## 2022-06-28 MED ORDER — ZOLMITRIPTAN 2.5 MG PO TABS: 2.5000 mg | ORAL_TABLET | Freq: Once | ORAL | 0 refills | Status: DC

## 2022-06-28 MED ORDER — VENLAFAXINE HCL ER 37.5 MG PO CP24: 37.5000 mg | ORAL_CAPSULE | Freq: Every day | ORAL | 1 refills | Status: DC

## 2022-06-28 NOTE — Progress Notes (Addendum)
Acute Office Visit  Subjective:     Patient ID: Shannon Wise, female    DOB: November 12, 1934, 88 y.o.   MRN: [id number]  Chief Complaint  Patient presents with   Acute Visit    Migraines , has multiple a day    HPI Patient is in today for ongoing migraines. She states that they seem to be happening more frequently.  She has had 2 migraines this past week.  She is taking Tylenol but feels like that this makes it worse.  She does not remember if she has tried anything else in the past for her migraines.  She denies chest pain, shortness of breath, dizziness, weakness on one side of her body, trouble speaking.  ROS See pertinent positives and negatives per HPI.     Objective:    BP 126/82 (BP Location: Left Arm, Patient Position: Sitting, Cuff Size: Small)   Pulse 96   Temp (!) 97.1 F (36.2 C) (Temporal)   Ht 5\' 5"  (1.651 m)   Wt 145 lb 3.2 oz (65.9 kg)   SpO2 96%   BMI 24.16 kg/m    Physical Exam Vitals and nursing note reviewed.  Constitutional:      General: She is not in acute distress.    Appearance: Normal appearance.  HENT:     Head: Normocephalic.     Right Ear: Decreased hearing noted.     Left Ear: Decreased hearing noted.  Eyes:     Conjunctiva/sclera: Conjunctivae normal.  Cardiovascular:     Rate and Rhythm: Normal rate and regular rhythm.     Pulses: Normal pulses.     Heart sounds: Normal heart sounds.  Pulmonary:     Effort: Pulmonary effort is normal.     Breath sounds: Normal breath sounds.  Musculoskeletal:     Cervical back: Normal range of motion.  Skin:    General: Skin is warm.  Neurological:     General: No focal deficit present.     Mental Status: She is alert and oriented to person, place, and time.     Cranial Nerves: No cranial nerve deficit.     Gait: Gait normal.  Psychiatric:        Mood and Affect: Mood normal.        Behavior: Behavior normal.        Thought Content: Thought content normal.        Judgment: Judgment  normal.       Assessment & Plan:   Problem List Items Addressed This Visit       Cardiovascular and Mediastinum   Chronic mixed headache syndrome - Primary    She is having an increase in frequency and her migraines recently, having about 2/week.  Upon review of her chart it was noted that she was taking Zomig as needed for migraine.  She states that she does not have any of this medication.  Refill sent to the pharmacy.  She also has been prescribed amitriptyline in the past, however it was noted she had an allergic reaction to this to some possible red dye in it.  We will start her on venlafaxine 37.5 mg daily to help prevent migraines.  If she is still having ongoing headaches, consider referral to neurology.  Addendum: Zomig is not on formulary. Will start rizatriptan as needed for migraine.      Relevant Medications   venlafaxine XR (EFFEXOR XR) 37.5 MG 24 hr capsule   rizatriptan (MAXALT) 5 MG tablet  Meds ordered this encounter  Medications   venlafaxine XR (EFFEXOR XR) 37.5 MG 24 hr capsule    Sig: Take 1 capsule (37.5 mg total) by mouth daily with breakfast.    Dispense:  30 capsule    Refill:  1   DISCONTD: ZOLMitriptan (ZOMIG) 2.5 MG tablet    Sig: Take 1 tablet (2.5 mg total) by mouth once for 1 dose. May repeat in 2 hours if headache persists or recurs.    Dispense:  10 tablet    Refill:  0   rizatriptan (MAXALT) 5 MG tablet    Sig: Take 1 tablet (5 mg total) by mouth as needed for migraine. May repeat in 2 hours if needed    Dispense:  10 tablet    Refill:  0    Return in about 4 weeks (around 07/26/2022) for migraine with Dr. Veto Kemps.  Gerre Scull, NP

## 2022-06-28 NOTE — Patient Instructions (Signed)
It was great to see you!  Start zomig as needed for your migraine. Take 1 tablet at the first sign of a headache.   Start venlafaxine once a day to help prevent your migraines. Take 1 pill every day.   Let's follow-up in 4 weeks , sooner if you have concerns.  If a referral was placed today, you will be contacted for an appointment. Please note that routine referrals can sometimes take up to 3-4 weeks to process. Please call our office if you haven't heard anything after this time frame.  Take care,  Rodman Pickle, NP

## 2022-06-28 NOTE — Assessment & Plan Note (Addendum)
She is having an increase in frequency and her migraines recently, having about 2/week.  Upon review of her chart it was noted that she was taking Zomig as needed for migraine.  She states that she does not have any of this medication.  Refill sent to the pharmacy.  She also has been prescribed amitriptyline in the past, however it was noted she had an allergic reaction to this to some possible red dye in it.  We will start her on venlafaxine 37.5 mg daily to help prevent migraines.  If she is still having ongoing headaches, consider referral to neurology.  Addendum: Zomig is not on formulary. Will start rizatriptan as needed for migraine.

## 2022-06-29 MED ORDER — RIZATRIPTAN BENZOATE 5 MG PO TABS: 5.0000 mg | ORAL_TABLET | ORAL | 0 refills | Status: DC | PRN

## 2022-06-29 NOTE — Addendum Note (Signed)
Addended by: Rodman Pickle A on: 06/29/2022 03:20 PM   Modules accepted: Orders

## 2022-08-24 ENCOUNTER — Telehealth: Payer: Self-pay | Admitting: Family Medicine

## 2022-08-24 NOTE — Telephone Encounter (Signed)
Left message for patient's son, Fredrik Cove,  to call back and schedule Medicare Annual Wellness Visit (AWV).   Please offer to do virtually or by telephone.  Left office number and my jabber [phone].  Last AWV:09/07/2021  Please schedule at anytime with Nurse Health Advisor.

## 2022-09-05 ENCOUNTER — Other Ambulatory Visit: Payer: Self-pay | Admitting: Nurse Practitioner

## 2022-09-13 ENCOUNTER — Other Ambulatory Visit: Payer: Self-pay | Admitting: Nurse Practitioner

## 2022-09-13 ENCOUNTER — Ambulatory Visit (INDEPENDENT_AMBULATORY_CARE_PROVIDER_SITE_OTHER): Payer: Medicare HMO

## 2022-09-13 VITALS — Ht 66.0 in | Wt 140.0 lb

## 2022-09-13 DIAGNOSIS — Z0000 Encounter for general adult medical examination without abnormal findings: Secondary | ICD-10-CM

## 2022-09-13 NOTE — Progress Notes (Addendum)
Subjective:   Shannon Wise is a 88 y.o. female who presents for Medicare Annual (Subsequent) preventive examination.   Virtual Visit via Telephone Note  I connected with  [name] on 09/13/22 at  3:45 PM EDT by telephone and verified that I am speaking with the correct person using two identifiers.  Location: Patient: home  Provider: [facility]  Persons participating in the virtual visit: patient/Nurse Health Advisor   I discussed the limitations, risks, security and privacy concerns of performing an evaluation and management service by telephone and the availability of in person appointments. The patient expressed understanding and agreed to proceed.  Interactive audio and video telecommunications were attempted between this nurse and patient, however failed, due to patient having technical difficulties OR patient did not have access to video capability.  We continued and completed visit with audio only.  Some vital signs may be absent or patient reported.   Lorrene Reid, LPN  Review of Systems     Cardiac Risk Factors include: advanced age (>37men, >14 women)     Objective:    Today's Vitals   09/13/22 1545  Weight: 140 lb (63.5 kg)  Height: 5\' 6"  (1.676 m)   Body mass index is 22.6 kg/m.     09/13/2022    3:50 PM 04/21/2022   10:00 PM 04/21/2022    5:47 PM 09/07/2021   11:03 AM 12/23/2020    1:46 PM 08/01/2020    5:30 PM 05/15/2020   11:52 AM  Advanced Directives  Does Patient Have a Medical Advance Directive? Yes No No No No No No  Type of 07/15/2020 of [place];Living will        Copy of Healthcare Power of Attorney in Chart? No - copy requested        Would patient like information on creating a medical advance directive?  No - Patient declined No - Patient declined No - Patient declined  No - Patient declined     Current Medications (verified) Outpatient Encounter Medications as of 09/13/2022  Medication Sig    acetaminophen (TYLENOL) 500 MG tablet Take 2 tablets (1,000 mg total) by mouth every 6 (six) hours as needed.   naproxen (NAPROSYN) 500 MG tablet TAKE 1 TABLET(500 MG) BY MOUTH TWICE DAILY WITH A MEAL   rizatriptan (MAXALT) 5 MG tablet Take 1 tablet (5 mg total) by mouth as needed for migraine. May repeat in 2 hours if needed   simvastatin (ZOCOR) 20 MG tablet Take 1 tablet (20 mg total) by mouth at bedtime. (Patient not taking: Reported on 09/13/2022)   venlafaxine XR (EFFEXOR-XR) 37.5 MG 24 hr capsule TAKE 1 CAPSULE(37.5 MG) BY MOUTH DAILY WITH BREAKFAST (Patient not taking: Reported on 09/13/2022)   [DISCONTINUED] fluticasone (FLONASE) 50 MCG/ACT nasal spray 1 spray by Each Nare route daily for 10 days.   [DISCONTINUED] loratadine (CLARITIN) 10 MG tablet Take by mouth.   No facility-administered encounter medications on file as of 09/13/2022.    Allergies (verified) Codeine, Levaquin [levofloxacin], and Red dye   History: Past Medical History:  Diagnosis Date   Anxiety    HOH (hard of hearing)    Migraine    Stroke (HCC)    UTI (urinary tract infection)    Past Surgical History:  Procedure Laterality Date   ABDOMINAL HYSTERECTOMY     BLADDER SURGERY     History reviewed. No pertinent family history. Social History   Socioeconomic History   Marital status: Divorced  Spouse name: Not on file   Number of children: Not on file   Years of education: Not on file   Highest education level: Not on file  Occupational History   Not on file  Tobacco Use   Smoking status: Never   Smokeless tobacco: Former  Building services engineer Use: Never used  Substance and Sexual Activity   Alcohol use: No   Drug use: No   Sexual activity: Not Currently  Other Topics Concern   Not on file  Social History Narrative   Not on file   Social Determinants of Health   Financial Resource Strain: Low Risk  (09/13/2022)   Overall Financial Resource Strain (CARDIA)    Difficulty of Paying Living  Expenses: Not hard at all  Food Insecurity: No Food Insecurity (09/13/2022)   Hunger Vital Sign    Worried About Running Out of Food in the Last Year: Never true    Ran Out of Food in the Last Year: Never true  Transportation Needs: No Transportation Needs (09/13/2022)   PRAPARE - Administrator, Civil Service (Medical): No    Lack of Transportation (Non-Medical): No  Physical Activity: Inactive (09/07/2021)   Exercise Vital Sign    Days of Exercise per Week: 0 days    Minutes of Exercise per Session: 0 min  Stress: No Stress Concern Present (09/13/2022)   [employer] of Occupational Health - Occupational Stress Questionnaire    Feeling of Stress : Not at all  Social Connections: Socially Isolated (09/13/2022)   Social Connection and Isolation Panel [NHANES]    Frequency of Communication with Friends and Family: More than three times a week    Frequency of Social Gatherings with Friends and Family: More than three times a week    Attends Religious Services: Never    Database administrator or Organizations: No    Attends Banker Meetings: Never    Marital Status: Widowed    Tobacco Counseling Counseling given: Not Answered   Clinical Intake:  Pre-visit preparation completed: Yes  Pain : No/denies pain     Nutritional Risks: None Diabetes: No  How often do you need to have someone help you when you read instructions, pamphlets, or other written materials from your doctor or pharmacy?: 1 - Never  Diabetic?no   Interpreter Needed?: No  Information entered by :: Renie Ora, LPN   Activities of Daily Living    09/13/2022    3:50 PM 04/21/2022   10:00 PM  In your present state of health, do you have any difficulty performing the following activities:  Hearing? 0 1  Vision? 0 0  Difficulty concentrating or making decisions? 0 0  Walking or climbing stairs? 0 0  Dressing or bathing? 0 0  Doing errands, shopping? 0 0  Preparing Food and  eating ? N   Using the Toilet? N   In the past six months, have you accidently leaked urine? N   Do you have problems with loss of bowel control? N   Managing your Medications? N   Managing your Finances? N   Housekeeping or managing your Housekeeping? N     Patient Care Team: Loyola Mast, MD as PCP - General (Family Medicine) Myra Rude, MD as Consulting Physician (Sports Medicine)  Indicate any recent Medical Services you may have received from other than [facility] providers in the past year (date may be approximate).     Assessment:   This  is a routine wellness examination for [place].  Hearing/Vision screen Vision Screening - Comments:: Annual eye exams wear glasses   Dietary issues and exercise activities discussed: Current Exercise Habits: Home exercise routine, Type of exercise: walking, Time (Minutes): 30, Frequency (Times/Week): 3, Weekly Exercise (Minutes/Week): 90, Intensity: Mild, Exercise limited by: None identified   Goals Addressed             This Visit's Progress    Patient Stated   On track    Would like to continue current lifestyle       Depression Screen    09/13/2022    3:49 PM 01/24/2022   11:21 AM 09/07/2021   11:01 AM  PHQ 2/9 Scores  PHQ - 2 Score 0 0 0    Fall Risk    09/13/2022    3:46 PM 01/24/2022   11:20 AM 09/07/2021   11:03 AM 05/03/2021    1:08 PM  Fall Risk   Falls in the past year? 0 0 0 0  Number falls in past yr: 0 0 0 0  Injury with Fall? 0 0 0 0  Risk for fall due to : No Fall Risks No Fall Risks    Follow up Falls prevention discussed Falls evaluation completed Falls evaluation completed;Falls prevention discussed     FALL RISK PREVENTION PERTAINING TO THE HOME:  Any stairs in or around the home? No  If so, are there any without handrails? No  Home free of loose throw rugs in walkways, pet beds, electrical cords, etc? Yes  Adequate lighting in your home to reduce risk of falls? Yes   ASSISTIVE DEVICES  UTILIZED TO PREVENT FALLS:  Life alert? No  Use of a cane, walker or w/c? No  Grab bars in the bathroom? No  Shower chair or bench in shower? No  Elevated toilet seat or a handicapped toilet? No        09/13/2022    3:51 PM  6CIT Screen  What Year? 0 points  What month? 0 points  What time? 0 points  Count back from 20 0 points  Months in reverse 0 points  Repeat phrase 0 points  Total Score 0 points    Immunizations Immunization History  Administered Date(s) Administered   Fluad Quad(high Dose 65+) 11/06/2021   Influenza, High Dose Seasonal PF 10/21/2013, 01/22/2015   Influenza,inj,Quad PF,6+ Mos 10/11/2016   Pneumococcal Conjugate-13 10/03/2016   Pneumococcal Polysaccharide-23 04/18/2013, 10/24/2013, 03/23/2014   Pneumococcal-Unspecified 09/30/2010   Tdap 04/18/2013   Tetanus 04/18/2013    TDAP status: Up to date  Flu Vaccine status: Due, Education has been provided regarding the importance of this vaccine. Advised may receive this vaccine at local pharmacy or Health Dept. Aware to provide a copy of the vaccination record if obtained from local pharmacy or Health Dept. Verbalized acceptance and understanding.  Pneumococcal vaccine status: Up to date  Covid-19 vaccine status: Completed vaccines  Qualifies for Shingles Vaccine? Yes   Zostavax completed No   Shingrix Completed?: No.    Education has been provided regarding the importance of this vaccine. Patient has been advised to call insurance company to determine out of pocket expense if they have not yet received this vaccine. Advised may also receive vaccine at local pharmacy or Health Dept. Verbalized acceptance and understanding.  Screening Tests Health Maintenance  Topic Date Due   COVID-19 Vaccine (1) Never done   Zoster Vaccines- Shingrix (1 of 2) Never done   INFLUENZA VACCINE  07/10/2022  TETANUS/TDAP  04/19/2023   Pneumonia Vaccine 76+ Years old  Completed   DEXA SCAN  Completed   HPV VACCINES   Aged Out    Health Maintenance  Health Maintenance Due  Topic Date Due   COVID-19 Vaccine (1) Never done   Zoster Vaccines- Shingrix (1 of 2) Never done   INFLUENZA VACCINE  07/10/2022    Colorectal cancer screening: No longer required.   Mammogram status: No longer required due to age.  Bone Density status: Completed 10/24/2020. Results reflect: Bone density results: OSTEOPENIA. Repeat every 5 years.  Lung Cancer Screening: (Low Dose CT Chest recommended if Age 71-80 years, 30 pack-year currently smoking OR have quit w/in 15years.) does not qualify.   Lung Cancer Screening Referral: n/a  Additional Screening:  Hepatitis C Screening: does not qualify;   Vision Screening: Recommended annual ophthalmology exams for early detection of glaucoma and other disorders of the eye. Is the patient up to date with their annual eye exam?  Yes  Who is the provider or what is the name of the office in which the patient attends annual eye exams? [facility]  If pt is not established with a provider, would they like to be referred to a provider to establish care? No .   Dental Screening: Recommended annual dental exams for proper oral hygiene  Community Resource Referral / Chronic Care Management: CRR required this visit?  No   CCM required this visit?  No      Plan:     I have personally reviewed and noted the following in the patient's chart:   Medical and social history Use of alcohol, tobacco or illicit drugs  Current medications and supplements including opioid prescriptions. Patient is not currently taking opioid prescriptions. Functional ability and status Nutritional status Physical activity Advanced directives List of other physicians Hospitalizations, surgeries, and ER visits in previous 12 months Vitals Screenings to include cognitive, depression, and falls Referrals and appointments  In addition, I have reviewed and discussed with patient certain preventive  protocols, quality metrics, and best practice recommendations. A written personalized care plan for preventive services as well as general preventive health recommendations were provided to patient.     Daphane Shepherd, LPN   02/14/487   Nurse Notes: [place] Flu Vaccine

## 2022-09-13 NOTE — Patient Instructions (Signed)
Shannon Wise , Thank you for taking time to come for your Medicare Wellness Visit. I appreciate your ongoing commitment to your health goals. Please review the following plan we discussed and let me know if I can assist you in the future.   These are the goals we discussed:  Goals      Patient Stated     Would like to continue current lifestyle        This is a list of the screening recommended for you and due dates:  Health Maintenance  Topic Date Due   COVID-19 Vaccine (1) Never done   Zoster (Shingles) Vaccine (1 of 2) Never done   Flu Shot  07/10/2022   Tetanus Vaccine  04/19/2023   Pneumonia Vaccine  Completed   DEXA scan (bone density measurement)  Completed   HPV Vaccine  Aged Out    Advanced directives: Please bring a copy of your health care power of attorney and living will to the office to be added to your chart at your convenience.   Conditions/risks identified: Aim for 30 minutes of exercise or brisk walking, 6-8 glasses of water, and 5 servings of fruits and vegetables each day.   Next appointment: Follow up in one year for your annual wellness visit    Preventive Care 65 Years and Older, Female Preventive care refers to lifestyle choices and visits with your health care provider that can promote health and wellness. What does preventive care include? A yearly physical exam. This is also called an annual well check. Dental exams once or twice a year. Routine eye exams. Ask your health care provider how often you should have your eyes checked. Personal lifestyle choices, including: Daily care of your teeth and gums. Regular physical activity. Eating a healthy diet. Avoiding tobacco and drug use. Limiting alcohol use. Practicing safe sex. Taking low-dose aspirin every day. Taking vitamin and mineral supplements as recommended by your health care provider. What happens during an annual well check? The services and screenings done by your health care provider  during your annual well check will depend on your age, overall health, lifestyle risk factors, and family history of disease. Counseling  Your health care provider may ask you questions about your: Alcohol use. Tobacco use. Drug use. Emotional well-being. Home and relationship well-being. Sexual activity. Eating habits. History of falls. Memory and ability to understand (cognition). Work and work Statistician. Reproductive health. Screening  You may have the following tests or measurements: Height, weight, and BMI. Blood pressure. Lipid and cholesterol levels. These may be checked every 5 years, or more frequently if you are over 74 years old. Skin check. Lung cancer screening. You may have this screening every year starting at age 37 if you have a 30-pack-year history of smoking and currently smoke or have quit within the past 15 years. Fecal occult blood test (FOBT) of the stool. You may have this test every year starting at age 86. Flexible sigmoidoscopy or colonoscopy. You may have a sigmoidoscopy every 5 years or a colonoscopy every 10 years starting at age 7. Hepatitis C blood test. Hepatitis B blood test. Sexually transmitted disease (STD) testing. Diabetes screening. This is done by checking your blood sugar (glucose) after you have not eaten for a while (fasting). You may have this done every 1-3 years. Bone density scan. This is done to screen for osteoporosis. You may have this done starting at age 24. Mammogram. This may be done every 1-2 years. Talk to your health  care provider about how often you should have regular mammograms. Talk with your health care provider about your test results, treatment options, and if necessary, the need for more tests. Vaccines  Your health care provider may recommend certain vaccines, such as: Influenza vaccine. This is recommended every year. Tetanus, diphtheria, and acellular pertussis (Tdap, Td) vaccine. You may need a Td booster every  10 years. Zoster vaccine. You may need this after age 19. Pneumococcal 13-valent conjugate (PCV13) vaccine. One dose is recommended after age 46. Pneumococcal polysaccharide (PPSV23) vaccine. One dose is recommended after age 3. Talk to your health care provider about which screenings and vaccines you need and how often you need them. This information is not intended to replace advice given to you by your health care provider. Make sure you discuss any questions you have with your health care provider. Document Released: 12/23/2015 Document Revised: 08/15/2016 Document Reviewed: 09/27/2015 Elsevier Interactive Patient Education  2017 [employer] Prevention in the Home Falls can cause injuries. They can happen to people of all ages. There are many things you can do to make your home safe and to help prevent falls. What can I do on the outside of my home? Regularly fix the edges of walkways and driveways and fix any cracks. Remove anything that might make you trip as you walk through a door, such as a raised step or threshold. Trim any bushes or trees on the path to your home. Use bright outdoor lighting. Clear any walking paths of anything that might make someone trip, such as rocks or tools. Regularly check to see if handrails are loose or broken. Make sure that both sides of any steps have handrails. Any raised decks and porches should have guardrails on the edges. Have any leaves, snow, or ice cleared regularly. Use sand or salt on walking paths during winter. Clean up any spills in your garage right away. This includes oil or grease spills. What can I do in the bathroom? Use night lights. Install grab bars by the toilet and in the tub and shower. Do not use towel bars as grab bars. Use non-skid mats or decals in the tub or shower. If you need to sit down in the shower, use a plastic, non-slip stool. Keep the floor dry. Clean up any water that spills on the floor as soon as it  happens. Remove soap buildup in the tub or shower regularly. Attach bath mats securely with double-sided non-slip rug tape. Do not have throw rugs and other things on the floor that can make you trip. What can I do in the bedroom? Use night lights. Make sure that you have a light by your bed that is easy to reach. Do not use any sheets or blankets that are too big for your bed. They should not hang down onto the floor. Have a firm chair that has side arms. You can use this for support while you get dressed. Do not have throw rugs and other things on the floor that can make you trip. What can I do in the kitchen? Clean up any spills right away. Avoid walking on wet floors. Keep items that you use a lot in easy-to-reach places. If you need to reach something above you, use a strong step stool that has a grab bar. Keep electrical cords out of the way. Do not use floor polish or wax that makes floors slippery. If you must use wax, use non-skid floor wax. Do not have throw  rugs and other things on the floor that can make you trip. What can I do with my stairs? Do not leave any items on the stairs. Make sure that there are handrails on both sides of the stairs and use them. Fix handrails that are broken or loose. Make sure that handrails are as long as the stairways. Check any carpeting to make sure that it is firmly attached to the stairs. Fix any carpet that is loose or worn. Avoid having throw rugs at the top or bottom of the stairs. If you do have throw rugs, attach them to the floor with carpet tape. Make sure that you have a light switch at the top of the stairs and the bottom of the stairs. If you do not have them, ask someone to add them for you. What else can I do to help prevent falls? Wear shoes that: Do not have high heels. Have rubber bottoms. Are comfortable and fit you well. Are closed at the toe. Do not wear sandals. If you use a stepladder: Make sure that it is fully opened.  Do not climb a closed stepladder. Make sure that both sides of the stepladder are locked into place. Ask someone to hold it for you, if possible. Clearly mark and make sure that you can see: Any grab bars or handrails. First and last steps. Where the edge of each step is. Use tools that help you move around (mobility aids) if they are needed. These include: Canes. Walkers. Scooters. Crutches. Turn on the lights when you go into a dark area. Replace any light bulbs as soon as they burn out. Set up your furniture so you have a clear path. Avoid moving your furniture around. If any of your floors are uneven, fix them. If there are any pets around you, be aware of where they are. Review your medicines with your doctor. Some medicines can make you feel dizzy. This can increase your chance of falling. Ask your doctor what other things that you can do to help prevent falls. This information is not intended to replace advice given to you by your health care provider. Make sure you discuss any questions you have with your health care provider. Document Released: 09/22/2009 Document Revised: 05/03/2016 Document Reviewed: 12/31/2014 Elsevier Interactive Patient Education  2017 [employer].

## 2022-11-06 ENCOUNTER — Other Ambulatory Visit: Payer: Self-pay | Admitting: Nurse Practitioner

## 2022-11-12 NOTE — Telephone Encounter (Signed)
Refill request for  Venlafaxine 37.5 mg LR 09/06/22, #30,1 rf LOV 06/01/22 FOV  none scheduled.  Please review and advise. Thanks. Dm/cma

## 2022-11-21 ENCOUNTER — Other Ambulatory Visit (HOSPITAL_COMMUNITY): Payer: Self-pay

## 2023-03-27 ENCOUNTER — Encounter: Payer: Self-pay | Admitting: *Deleted

## 2023-07-05 ENCOUNTER — Encounter: Payer: Self-pay | Admitting: Family Medicine

## 2023-07-05 ENCOUNTER — Ambulatory Visit (INDEPENDENT_AMBULATORY_CARE_PROVIDER_SITE_OTHER): Payer: Medicare HMO | Admitting: Family Medicine

## 2023-07-05 VITALS — BP 120/68 | HR 79 | Temp 98.1°F | Wt 157.2 lb

## 2023-07-05 DIAGNOSIS — H6123 Impacted cerumen, bilateral: Secondary | ICD-10-CM

## 2023-07-05 DIAGNOSIS — G4489 Other headache syndrome: Secondary | ICD-10-CM | POA: Diagnosis not present

## 2023-07-05 MED ORDER — RIZATRIPTAN BENZOATE 5 MG PO TABS: 5.0000 mg | ORAL_TABLET | ORAL | 1 refills | Status: DC | PRN

## 2023-07-05 NOTE — Progress Notes (Signed)
[facility] [address] [place] [address] Dept: [phone] Dept Fax: [phone]  Office Visit  Subjective:    Patient ID: Shannon Wise, female    DOB: 28-Jan-1934, 89 y.o..   MRN: [id number]  Chief Complaint  Patient presents with   Medical Management of Chronic Issues    Fu meds.      History of Present Illness:  Patient is in today to discuss management of her migraine headaches. She has not been seen in ~ 1 year. She was previously prescribed rizatriptan for acute management and venlafaxine for prevention. She has stopped taking the venlafaxine. It is unclear if this was not helpful. She apparently had some benefit from the triptan, but was limited in how many she could get.It has been abotu 8 years since she last saw a neurologist for management.  Additionally, Shannon Wise has had impacted wax and would like her ears checked. She has severe sensorineural hearing loss which makes communication a challenge.  Past Medical History: Patient Active Problem List   Diagnosis Date Noted   Left upper arm pain 05/03/2021   Cerebrovascular disease 05/03/2021   Diverticulosis 05/03/2021   Aortic atherosclerosis (HCC) 05/03/2021   Cataracts, bilateral 05/03/2021   Perennial allergic rhinitis with seasonal variation 06/04/2020   Chronic UTI (urinary tract infection) 06/04/2020   Chronic mixed headache syndrome 06/04/2020   Primary osteoarthritis of both knees 05/17/2020   History of kidney stones 07/11/2018   Sleep apnea 11/03/2016   Generalized anxiety disorder 11/03/2016   TMJ (temporomandibular joint disorder) 10/11/2016   PVC (premature ventricular contraction) 03/16/2016   Palpitations 03/16/2016   Sensorineural hearing loss (SNHL), bilateral 01/04/2016   Facet arthropathy 12/29/2014   DDD (degenerative disc disease), lumbar 12/29/2014   Cervical spine fracture (HCC) 03/25/2014   Past Surgical History:   Procedure Laterality Date   ABDOMINAL HYSTERECTOMY     BLADDER SURGERY     No family history on file. Outpatient Medications Prior to Visit  Medication Sig Dispense Refill   acetaminophen (TYLENOL) 650 MG CR tablet Take 650 mg by mouth every 8 (eight) hours as needed for pain.     naproxen (NAPROSYN) 500 MG tablet TAKE 1 TABLET(500 MG) BY MOUTH TWICE DAILY WITH A MEAL 30 tablet 0   venlafaxine XR (EFFEXOR-XR) 37.5 MG 24 hr capsule TAKE 1 CAPSULE(37.5 MG) BY MOUTH DAILY WITH BREAKFAST 30 capsule 1   acetaminophen (TYLENOL) 500 MG tablet Take 2 tablets (1,000 mg total) by mouth every 6 (six) hours as needed. 30 tablet 0   rizatriptan (MAXALT) 5 MG tablet TAKE 1 TABLET BY MOUTH AS NEEDED FOR MIGRAINE MAY REPEAT IN 2 HOURS IF NEEDED 10 tablet 1   simvastatin (ZOCOR) 20 MG tablet Take 1 tablet (20 mg total) by mouth at bedtime. (Patient not taking: Reported on 09/13/2022) 90 tablet 3   No facility-administered medications prior to visit.   Allergies  Allergen Reactions   Codeine    Levaquin [Levofloxacin] Other (See Comments)    Abdominal pain   Red Dye      Objective:   Today's Vitals   07/05/23 1700  BP: 120/68  Pulse: 79  Temp: 98.1 F (36.7 C)  SpO2: 99%  Weight: 157 lb 3.2 oz (71.3 kg)   Body mass index is 25.37 kg/m.   General: Well developed, well nourished. No acute distress. HEENT: Normocephalic, non-traumatic. PERRL, EOMI. Conjunctiva clear. External ears normal. Both EAC have   some impacted wax. Psych: Alert  and oriented. Normal mood and affect.  Health Maintenance Due  Topic Date Due   DTaP/Tdap/Td (2 - Td or Tdap) 04/19/2023   PROCEDURE- Ear Wax Removal Indication: Impacted ear wax  PARQ reviewed with patient. Verbal consent obtained. Flushed both ears with mixture of warm water and hydrogen peroxide. Patient tolerated procedure well.    Assessment & Plan:   Problem List Items Addressed This Visit       Cardiovascular and Mediastinum   Chronic mixed  headache syndrome - Primary    I will renew the rizatriptan. I recommend we try and get Ms. Haik back in with neurology to try and find a regimen that could decrease the frequency of her headaches.      Relevant Medications   acetaminophen (TYLENOL) 650 MG CR tablet   rizatriptan (MAXALT) 5 MG tablet   Other Relevant Orders   Ambulatory referral to Neurology   Other Visit Diagnoses     Bilateral impacted cerumen       Ears flushed as noted above.       Return in about 6 months (around 01/05/2024), or if symptoms worsen or fail to improve.   Loyola Mast, MD

## 2023-07-05 NOTE — Assessment & Plan Note (Signed)
I will renew the rizatriptan. I recommend we try and get Shannon Wise back in with neurology to try and find a regimen that could decrease the frequency of her headaches.

## 2023-08-23 ENCOUNTER — Ambulatory Visit (INDEPENDENT_AMBULATORY_CARE_PROVIDER_SITE_OTHER): Payer: Medicare HMO

## 2023-08-23 DIAGNOSIS — Z0000 Encounter for general adult medical examination without abnormal findings: Secondary | ICD-10-CM

## 2023-08-23 NOTE — Progress Notes (Signed)
Subjective:   Shannon Wise is a 89 y.o. female who presents for Medicare Annual (Subsequent) preventive examination.  Visit Complete: Virtual  I connected with  Shannon Wise on 08/23/23 by a audio enabled telemedicine application and verified that I am speaking with the correct person using two identifiers. Son Shannon Wise was also on the call.  Patient Location: Home  Provider Location: Office/Clinic  I discussed the limitations of evaluation and management by telemedicine. The patient expressed understanding and agreed to proceed.  Vital Signs: Unable to obtain new vitals due to this being a telehealth visit.  Cardiac Risk Factors include: advanced age (>31men, >38 women)     Objective:    Today's Vitals   08/23/23 1356  PainSc: 8    There is no height or weight on file to calculate BMI.     08/23/2023    2:06 PM 09/13/2022    3:50 PM 04/21/2022   10:00 PM 04/21/2022    5:47 PM 09/07/2021   11:03 AM 12/23/2020    1:46 PM 08/01/2020    5:30 PM  Advanced Directives  Does Patient Have a Medical Advance Directive? No Yes No No No No No  Type of Furniture conservator/restorer;Living will       Copy of Healthcare Power of Attorney in Chart?  No - copy requested       Would patient like information on creating a medical advance directive?   No - Patient declined No - Patient declined No - Patient declined  No - Patient declined    Current Medications (verified) Outpatient Encounter Medications as of 08/23/2023  Medication Sig   acetaminophen (TYLENOL) 650 MG CR tablet Take 650 mg by mouth every 8 (eight) hours as needed for pain.   rizatriptan (MAXALT) 5 MG tablet Take 1 tablet (5 mg total) by mouth as needed for migraine. May repeat in 2 hours if needed   naproxen (NAPROSYN) 500 MG tablet TAKE 1 TABLET(500 MG) BY MOUTH TWICE DAILY WITH A MEAL (Patient not taking: Reported on 08/23/2023)   [DISCONTINUED] fluticasone (FLONASE) 50 MCG/ACT nasal spray 1 spray  by Each Nare route daily for 10 days.   [DISCONTINUED] loratadine (CLARITIN) 10 MG tablet Take by mouth.   No facility-administered encounter medications on file as of 08/23/2023.    Allergies (verified) Codeine, Levaquin [levofloxacin], and Red dye #40 (allura red)   History: Past Medical History:  Diagnosis Date   Anxiety    HOH (hard of hearing)    Migraine    Stroke (HCC)    UTI (urinary tract infection)    Past Surgical History:  Procedure Laterality Date   ABDOMINAL HYSTERECTOMY     BLADDER SURGERY     History reviewed. No pertinent family history. Social History   Socioeconomic History   Marital status: Divorced    Spouse name: Not on file   Number of children: Not on file   Years of education: Not on file   Highest education level: Not on file  Occupational History   Not on file  Tobacco Use   Smoking status: Never   Smokeless tobacco: Former  Advertising account planner   Vaping status: Never Used  Substance and Sexual Activity   Alcohol use: No   Drug use: No   Sexual activity: Not Currently  Other Topics Concern   Not on file  Social History Narrative   Not on file   Social Determinants of Health   Financial Resource Strain:  Low Risk  (08/23/2023)   Overall Financial Resource Strain (CARDIA)    Difficulty of Paying Living Expenses: Not hard at all  Food Insecurity: No Food Insecurity (08/23/2023)   Hunger Vital Sign    Worried About Running Out of Food in the Last Year: Never true    Ran Out of Food in the Last Year: Never true  Transportation Needs: No Transportation Needs (08/23/2023)   PRAPARE - Administrator, Civil Service (Medical): No    Lack of Transportation (Non-Medical): No  Physical Activity: Inactive (08/23/2023)   Exercise Vital Sign    Days of Exercise per Week: 0 days    Minutes of Exercise per Session: 0 min  Stress: No Stress Concern Present (08/23/2023)   [employer] of Occupational Health - Occupational Stress Questionnaire     Feeling of Stress : Not at all  Social Connections: Socially Isolated (08/23/2023)   Social Connection and Isolation Panel [NHANES]    Frequency of Communication with Friends and Family: More than three times a week    Frequency of Social Gatherings with Friends and Family: More than three times a week    Attends Religious Services: Never    Database administrator or Organizations: No    Attends Engineer, structural: Never    Marital Status: Divorced    Tobacco Counseling Counseling given: Not Answered   Clinical Intake:  Pre-visit preparation completed: Yes  Pain : 0-10 Pain Score: 8  Pain Type: Chronic pain Pain Location: Shoulder Pain Orientation: Left Pain Descriptors / Indicators: Aching Pain Onset: More than a month ago Pain Frequency: Intermittent     Nutritional Risks: None Diabetes: No  How often do you need to have someone help you when you read instructions, pamphlets, or other written materials from your doctor or pharmacy?: 1 - Never  Interpreter Needed?: No  Information entered by :: NAllen LPN   Activities of Daily Living    08/23/2023    1:59 PM 09/13/2022    3:50 PM  In your present state of health, do you have any difficulty performing the following activities:  Hearing? 1 0  Comment declines hearing aids   Vision? 1 0  Comment wears glasses   Difficulty concentrating or making decisions? 1 0  Walking or climbing stairs? 0 0  Dressing or bathing? 0 0  Doing errands, shopping? 1 0  Preparing Food and eating ? N N  Using the Toilet? N N  In the past six months, have you accidently leaked urine? N N  Do you have problems with loss of bowel control? N N  Managing your Medications? N N  Managing your Finances? N N  Housekeeping or managing your Housekeeping? N N    Patient Care Team: Loyola Mast, MD as PCP - General (Family Medicine) Myra Rude, MD (Inactive) as Consulting Physician (Sports Medicine)  Indicate any  recent Medical Services you may have received from other than [facility] providers in the past year (date may be approximate).     Assessment:   This is a routine wellness examination for [place].  Hearing/Vision screen Hearing Screening - Comments:: Declines hearing aids Vision Screening - Comments:: No regular eye exams   Goals Addressed             This Visit's Progress    Patient Stated       08/23/2023, stay healthy       Depression Screen    08/23/2023  2:09 PM 09/13/2022    3:49 PM 01/24/2022   11:21 AM 09/07/2021   11:01 AM  PHQ 2/9 Scores  PHQ - 2 Score 0 0 0 0  PHQ- 9 Score 0       Fall Risk    08/23/2023    2:07 PM 09/13/2022    3:46 PM 01/24/2022   11:20 AM 09/07/2021   11:03 AM 05/03/2021    1:08 PM  Fall Risk   Falls in the past year? 0 0 0 0 0  Number falls in past yr: 0 0 0 0 0  Injury with Fall? 0 0 0 0 0  Risk for fall due to : Impaired balance/gait No Fall Risks No Fall Risks    Follow up Falls prevention discussed;Falls evaluation completed Falls prevention discussed Falls evaluation completed Falls evaluation completed;Falls prevention discussed     MEDICARE RISK AT HOME: Medicare Risk at Home Any stairs in or around the home?: No If so, are there any without handrails?: No Home free of loose throw rugs in walkways, pet beds, electrical cords, etc?: Yes Adequate lighting in your home to reduce risk of falls?: Yes Life alert?: No Use of a cane, walker or w/c?: No Grab bars in the bathroom?: No Shower chair or bench in shower?: No Elevated toilet seat or a handicapped toilet?: No  TIMED UP AND GO:  Was the test performed?  No    Cognitive Function:  6 CIT not administered. Son says she has trouble with memory. Declines        09/13/2022    3:51 PM  6CIT Screen  What Year? 0 points  What month? 0 points  What time? 0 points  Count back from 20 0 points  Months in reverse 0 points  Repeat phrase 0 points  Total Score 0 points     Immunizations Immunization History  Administered Date(s) Administered   Fluad Quad(high Dose 65+) 11/06/2021   Influenza, High Dose Seasonal PF 10/21/2013, 01/22/2015   Influenza,inj,Quad PF,6+ Mos 10/11/2016   Pneumococcal Conjugate-13 10/03/2016   Pneumococcal Polysaccharide-23 04/18/2013, 10/24/2013, 03/23/2014   Pneumococcal-Unspecified 09/30/2010   Tdap 04/18/2013   Tetanus 04/18/2013    TDAP status: Due, Education has been provided regarding the importance of this vaccine. Advised may receive this vaccine at local pharmacy or Health Dept. Aware to provide a copy of the vaccination record if obtained from local pharmacy or Health Dept. Verbalized acceptance and understanding.  Flu Vaccine status: Due, Education has been provided regarding the importance of this vaccine. Advised may receive this vaccine at local pharmacy or Health Dept. Aware to provide a copy of the vaccination record if obtained from local pharmacy or Health Dept. Verbalized acceptance and understanding.  Pneumococcal vaccine status: Up to date  Covid-19 vaccine status: Declined, Education has been provided regarding the importance of this vaccine but patient still declined. Advised may receive this vaccine at local pharmacy or Health Dept.or vaccine clinic. Aware to provide a copy of the vaccination record if obtained from local pharmacy or Health Dept. Verbalized acceptance and understanding.  Qualifies for Shingles Vaccine? Yes   Zostavax completed No   Shingrix Completed?: No.    Education has been provided regarding the importance of this vaccine. Patient has been advised to call insurance company to determine out of pocket expense if they have not yet received this vaccine. Advised may also receive vaccine at local pharmacy or Health Dept. Verbalized acceptance and understanding.  Screening Tests Health Maintenance  Topic  Date Due   DTaP/Tdap/Td (2 - Td or Tdap) 04/19/2023   INFLUENZA VACCINE   07/11/2023   COVID-19 Vaccine (1 - 2023-24 season) Never done   Zoster Vaccines- Shingrix (1 of 2) 07/03/2024 (Originally 03/20/1984)   Medicare Annual Wellness (AWV)  08/22/2024   Pneumonia Vaccine 52+ Years old  Completed   DEXA SCAN  Completed   HPV VACCINES  Aged Out    Health Maintenance  Health Maintenance Due  Topic Date Due   DTaP/Tdap/Td (2 - Td or Tdap) 04/19/2023   INFLUENZA VACCINE  07/11/2023   COVID-19 Vaccine (1 - 2023-24 season) Never done    Colorectal cancer screening: No longer required.   Mammogram status: No longer required due to age.  Bone Density status: Completed 10/24/2020.   Lung Cancer Screening: (Low Dose CT Chest recommended if Age 48-80 years, 20 pack-year currently smoking OR have quit w/in 15years.) does not qualify.   Lung Cancer Screening Referral: no  Additional Screening:  Hepatitis C Screening: does not qualify;   Vision Screening: Recommended annual ophthalmology exams for early detection of glaucoma and other disorders of the eye. Is the patient up to date with their annual eye exam?  No  Who is the provider or what is the name of the office in which the patient attends annual eye exams? none If pt is not established with a provider, would they like to be referred to a provider to establish care? No .   Dental Screening: Recommended annual dental exams for proper oral hygiene  Diabetic Foot Exam: n/a  Community Resource Referral / Chronic Care Management: CRR required this visit?  No   CCM required this visit?  No     Plan:     I have personally reviewed and noted the following in the patient's chart:   Medical and social history Use of alcohol, tobacco or illicit drugs  Current medications and supplements including opioid prescriptions. Patient is not currently taking opioid prescriptions. Functional ability and status Nutritional status Physical activity Advanced directives List of other physicians Hospitalizations,  surgeries, and ER visits in previous 12 months Vitals Screenings to include cognitive, depression, and falls Referrals and appointments  In addition, I have reviewed and discussed with patient certain preventive protocols, quality metrics, and best practice recommendations. A written personalized care plan for preventive services as well as general preventive health recommendations were provided to patient.     Barb Merino, LPN   1/61/0960   After Visit Summary: (Pick Up) Due to this being a telephonic visit, with patients personalized plan was offered to patient and patient has requested to Pick up at office.  Nurse Notes: none

## 2023-08-23 NOTE — Patient Instructions (Signed)
Ms. Rutherford , Thank you for taking time to come for your Medicare Wellness Visit. I appreciate your ongoing commitment to your health goals. Please review the following plan we discussed and let me know if I can assist you in the future.   Referrals/Orders/Follow-Ups/Clinician Recommendations: none  This is a list of the screening recommended for you and due dates:  Health Maintenance  Topic Date Due   DTaP/Tdap/Td vaccine (2 - Td or Tdap) 04/19/2023   Flu Shot  07/11/2023   COVID-19 Vaccine (1 - 2023-24 season) Never done   Zoster (Shingles) Vaccine (1 of 2) 07/03/2024*   Medicare Annual Wellness Visit  08/22/2024   Pneumonia Vaccine  Completed   DEXA scan (bone density measurement)  Completed   HPV Vaccine  Aged Out  *Topic was postponed. The date shown is not the original due date.    Advanced directives: (ACP Link)Information on Advanced Care Planning can be found at [facility] Advance Health Care Directives Advance Health Care Directives ([email or web address]   Next Medicare Annual Wellness Visit scheduled for next year: Yes  Insert Preventive Care attachment Insert FALL PREVENTION attachment if needed

## 2024-05-09 ENCOUNTER — Emergency Department (HOSPITAL_BASED_OUTPATIENT_CLINIC_OR_DEPARTMENT_OTHER)

## 2024-05-09 ENCOUNTER — Emergency Department (HOSPITAL_BASED_OUTPATIENT_CLINIC_OR_DEPARTMENT_OTHER)
Admission: EM | Admit: 2024-05-09 | Discharge: 2024-05-09 | Disposition: A | Discharge status: Home / Self Care | Attending: Emergency Medicine | Admitting: Emergency Medicine

## 2024-05-09 ENCOUNTER — Encounter (HOSPITAL_BASED_OUTPATIENT_CLINIC_OR_DEPARTMENT_OTHER): Payer: Self-pay | Admitting: Emergency Medicine

## 2024-05-09 DIAGNOSIS — R519 Headache, unspecified: Secondary | ICD-10-CM | POA: Diagnosis not present

## 2024-05-09 DIAGNOSIS — Z87891 Personal history of nicotine dependence: Secondary | ICD-10-CM | POA: Insufficient documentation

## 2024-05-09 DIAGNOSIS — R0789 Other chest pain: Secondary | ICD-10-CM | POA: Insufficient documentation

## 2024-05-09 DIAGNOSIS — R200 Anesthesia of skin: Secondary | ICD-10-CM | POA: Diagnosis not present

## 2024-05-09 DIAGNOSIS — I517 Cardiomegaly: Secondary | ICD-10-CM | POA: Diagnosis not present

## 2024-05-09 DIAGNOSIS — R0989 Other specified symptoms and signs involving the circulatory and respiratory systems: Secondary | ICD-10-CM | POA: Diagnosis not present

## 2024-05-09 DIAGNOSIS — I700 Atherosclerosis of aorta: Secondary | ICD-10-CM | POA: Diagnosis not present

## 2024-05-09 DIAGNOSIS — R079 Chest pain, unspecified: Secondary | ICD-10-CM | POA: Diagnosis not present

## 2024-05-09 LAB — CBC WITH DIFFERENTIAL/PLATELET
Abs Immature Granulocytes: 0.02 10*3/uL (ref 0.00–0.07)
Basophils Absolute: 0.1 10*3/uL (ref 0.0–0.1)
Basophils Relative: 1 %
Eosinophils Absolute: 0.1 10*3/uL (ref 0.0–0.5)
Eosinophils Relative: 1 %
HCT: 43.4 % (ref 36.0–46.0)
Hemoglobin: 14.1 g/dL (ref 12.0–15.0)
Immature Granulocytes: 0 %
Lymphocytes Relative: 31 %
Lymphs Abs: 3 10*3/uL (ref 0.7–4.0)
MCH: 30.3 pg (ref 26.0–34.0)
MCHC: 32.5 g/dL (ref 30.0–36.0)
MCV: 93.3 fL (ref 80.0–100.0)
Monocytes Absolute: 0.6 10*3/uL (ref 0.1–1.0)
Monocytes Relative: 6 %
Neutro Abs: 6 10*3/uL (ref 1.7–7.7)
Neutrophils Relative %: 61 %
Platelets: 337 10*3/uL (ref 150–400)
RBC: 4.65 MIL/uL (ref 3.87–5.11)
RDW: 12.3 % (ref 11.5–15.5)
WBC: 9.8 10*3/uL (ref 4.0–10.5)
nRBC: 0 % (ref 0.0–0.2)

## 2024-05-09 LAB — BASIC METABOLIC PANEL WITH GFR
Anion gap: 10 (ref 5–15)
BUN: 14 mg/dL (ref 8–23)
CO2: 26 mmol/L (ref 22–32)
Calcium: 9.4 mg/dL (ref 8.9–10.3)
Chloride: 103 mmol/L (ref 98–111)
Creatinine, Ser: 0.75 mg/dL (ref 0.44–1.00)
GFR, Estimated: >60 mL/min (ref >60)
Glucose, Bld: 116 mg/dL — ABNORMAL HIGH (ref 70–99)
Potassium: 4.3 mmol/L (ref 3.5–5.1)
Sodium: 139 mmol/L (ref 135–145)

## 2024-05-09 LAB — TROPONIN T, HIGH SENSITIVITY: Troponin T High Sensitivity: <15 ng/L (ref <19)

## 2024-05-09 NOTE — ED Notes (Signed)

## 2024-05-09 NOTE — ED Provider Notes (Signed)
 [place] [facility] Provider Note  CSN: [id number] Arrival date & time: 05/09/24 1519  Chief Complaint(s) Chest Pain  HPI Shannon Wise is a 89 y.o. female history of migraine presenting to the emergency department with chest pain.  Patient reports chest pain intermittently for 2 days.  Reports some numbness in the right hand today, also reported some headache to nursing.  Denies any nausea or vomiting, no exertional quality of the pain, no fevers or chills, no cough, no runny nose or sore throat.  No headache pain.  No shortness of breath.  No recent travel or surgeries.  Pain does not radiate.  Reports within the left chest.  Currently, patient denies any symptoms.  No head injuries or falls.   Past Medical History Past Medical History:  Diagnosis Date   Anxiety    HOH (hard of hearing)    Migraine    Stroke [facility])    UTI (urinary tract infection)    Patient Active Problem List   Diagnosis Date Noted   Left upper arm pain 05/03/2021   Cerebrovascular disease 05/03/2021   Diverticulosis 05/03/2021   Aortic atherosclerosis (HCC) 05/03/2021   Cataracts, bilateral 05/03/2021   Perennial allergic rhinitis with seasonal variation 06/04/2020   Chronic UTI (urinary tract infection) 06/04/2020   Chronic mixed headache syndrome 06/04/2020   Primary osteoarthritis of both knees 05/17/2020   History of kidney stones 07/11/2018   Sleep apnea 11/03/2016   Generalized anxiety disorder 11/03/2016   TMJ (temporomandibular joint disorder) 10/11/2016   PVC (premature ventricular contraction) 03/16/2016   Palpitations 03/16/2016   Sensorineural hearing loss (SNHL), bilateral 01/04/2016   Facet arthropathy 12/29/2014   DDD (degenerative disc disease), lumbar 12/29/2014   Cervical spine fracture (HCC) 03/25/2014   Home Medication(s) Prior to Admission medications   Medication Sig Start Date End Date Taking? Authorizing Provider  acetaminophen   (TYLENOL ) 650 MG CR tablet Take 650 mg by mouth every 8 (eight) hours as needed for pain.    [provider]  naproxen  (NAPROSYN ) 500 MG tablet TAKE 1 TABLET(500 MG) BY MOUTH TWICE DAILY WITH A MEAL Patient not taking: Reported on 08/23/2023 05/18/22   Graig Lawyer, MD  rizatriptan  (MAXALT ) 5 MG tablet Take 1 tablet (5 mg total) by mouth as needed for migraine. May repeat in 2 hours if needed 07/05/23   Graig Lawyer, MD  fluticasone (FLONASE) 50 MCG/ACT nasal spray 1 spray by Each Nare route daily for 10 days. 11/07/17 06/03/19  [provider]  loratadine (CLARITIN) 10 MG tablet Take by mouth. 11/07/17 06/14/19  [provider]                                                                                                                                    Past Surgical History Past Surgical History:  Procedure Laterality Date   ABDOMINAL HYSTERECTOMY     BLADDER SURGERY  Family History History reviewed. No pertinent family history.  Social History Social History   Tobacco Use   Smoking status: Never   Smokeless tobacco: Former  Building services engineer status: Never Used  Substance Use Topics   Alcohol use: No   Drug use: No   Allergies Codeine, Levaquin  [levofloxacin ], and Red dye #40 (allura red)  Review of Systems Review of Systems  All other systems reviewed and are negative.   Physical Exam Vital Signs  I have reviewed the triage vital signs BP (!) 135/92   Pulse 86   Temp 97.9 F (36.6 C) (Oral)   Resp 15   SpO2 95%  Physical Exam Vitals and nursing note reviewed.  Constitutional:      General: She is not in acute distress.    Appearance: She is well-developed.  HENT:     Head: Normocephalic and atraumatic.     Mouth/Throat:     Mouth: Mucous membranes are moist.  Eyes:     Pupils: Pupils are equal, round, and reactive to light.  Cardiovascular:     Rate and Rhythm: Normal rate and regular rhythm.     Pulses:          Radial  pulses are 2+ on the right side and 2+ on the left side.     Heart sounds: No murmur heard. Pulmonary:     Effort: Pulmonary effort is normal. No respiratory distress.     Breath sounds: Normal breath sounds.  Abdominal:     General: Abdomen is flat.     Palpations: Abdomen is soft.     Tenderness: There is no abdominal tenderness.  Musculoskeletal:        General: No tenderness.     Right lower leg: No edema.     Left lower leg: No edema.  Skin:    General: Skin is warm and dry.  Neurological:     General: No focal deficit present.     Mental Status: She is alert. Mental status is at baseline.  Psychiatric:        Mood and Affect: Mood normal.        Behavior: Behavior normal.     ED Results and Treatments Labs (all labs ordered are listed, but only abnormal results are displayed) Labs Reviewed  BASIC METABOLIC PANEL WITH GFR - Abnormal; Notable for the following components:      Result Value   Glucose, Bld 116 (*)    All other components within normal limits  CBC WITH DIFFERENTIAL/PLATELET  TROPONIN T, HIGH SENSITIVITY                                                                                                                          Radiology CT Head Wo Contrast Result Date: 05/09/2024 CLINICAL DATA:  New onset headache.  Right upper extremity numbness. EXAM: CT HEAD WITHOUT CONTRAST TECHNIQUE: Contiguous axial images were obtained from the base of the skull through the vertex  without intravenous contrast. RADIATION DOSE REDUCTION: This exam was performed according to the departmental dose-optimization program which includes automated exposure control, adjustment of the mA and/or kV according to patient size and/or use of iterative reconstruction technique. COMPARISON:  08/29/2019 FINDINGS: Brain: No evidence of intracranial hemorrhage, acute infarction, hydrocephalus, extra-axial collection, or mass lesion/mass effect. Stable mild diffuse cerebral atrophy and chronic  small vessel disease. Old infarct is seen involving left basal ganglia and deep left frontal periventricular white matter. Old small left cerebellar infarct again noted. Vascular:  No hyperdense vessel or other acute findings. Skull: No evidence of fracture or other significant bone abnormality. Sinuses/Orbits:  No acute findings. Other: None. IMPRESSION: No acute intracranial abnormality. Stable mild cerebral atrophy and chronic small vessel disease. Old infarcts involving left basal ganglia and deep frontal periventricular white matter, and the left cerebellum. Electronically Signed   By: Marlyce Sine M.D.   On: 05/09/2024 17:22   DG Chest 2 View Result Date: 05/09/2024 CLINICAL DATA:  chest pain EXAM: CHEST - 2 VIEW COMPARISON:  June 01, 2022 FINDINGS: Low lung volumes. Chronic coarsening of the pulmonary interstitium without overt pulmonary edema. No focal airspace consolidation, pleural effusion, or pneumothorax. Mild cardiomegaly. Aortic atherosclerosis. No acute fracture or destructive lesion. Unchanged scoliosis of the spine. Multilevel thoracic osteophytosis. IMPRESSION: No acute cardiopulmonary abnormality. Electronically Signed   By: Rance Burrows M.D.   On: 05/09/2024 16:57    Pertinent labs & imaging results that were available during my care of the patient were reviewed by me and considered in my medical decision making (see MDM for details).  Medications Ordered in ED Medications - No data to display                                                                                                                                   Procedures Procedures  (including critical care time)  Medical Decision Making / ED Course   MDM:  89 year old presenting to the emergency department with chest pain.  Patient well-appearing, physical examination without focal abnormality.  Patient awake and alert.  EKG with no signs of STEMI.  Differential includes musculoskeletal pain, ACS,  pneumothorax, pneumonia.  Doubt esophageal perforation without nausea or vomiting.  Doubt dissection, no radiation of the pain, pain is intermittent.  Doubt pulmonary embolism, no tachycardia, hypoxia, risk factors such as recent travel or surgery.  Will check chest x-ray, labs including troponin, reassess.  Given headache, and age, will also obtain CT head.  Currently, the patient is symptom-free.  If her workup is reassuring, anticipate discharge.  Clinical Course as of 05/09/24 1818  Sat May 09, 2024  1816 Workup is negative.  Discussed results with the patient.  She does report pain is somewhat worse with movement, so suspect more musculoskeletal cause, but given age will have patient follow-up with cardiology. Will discharge patient to home. All questions answered. Patient comfortable with plan of discharge. Return  precautions discussed with patient and specified on the after visit summary.  [WS]    Clinical Course User Index [WS] Mordecai Applebaum, MD     Additional history obtained: -Additional history obtained from family -External records from outside source obtained and reviewed including: Chart review including previous notes, labs, imaging, consultation notes including prior notes    Lab Tests: -I ordered, reviewed, and interpreted labs.   The pertinent results include:   Labs Reviewed  BASIC METABOLIC PANEL WITH GFR - Abnormal; Notable for the following components:      Result Value   Glucose, Bld 116 (*)    All other components within normal limits  CBC WITH DIFFERENTIAL/PLATELET  TROPONIN T, HIGH SENSITIVITY    Notable for minimal hyperglycemia   EKG   EKG Interpretation Date/Time:  Saturday May 09 2024 15:33:16 EDT Ventricular Rate:  84 PR Interval:  154 QRS Duration:  83 QT Interval:  429 QTC Calculation: 508 R Axis:   -86  Text Interpretation: Sinus rhythm Atrial premature complex Inferior infarct, old Consider anterior infarct Prolonged QT interval  Confirmed by Hiawatha Lout ([id number]) on 05/09/2024 4:22:33 PM         Imaging Studies ordered: I ordered imaging studies including CXR, CT head  On my interpretation imaging demonstrates no acute process I independently visualized and interpreted imaging. I agree with the radiologist interpretation   Medicines ordered and prescription drug management: No orders of the defined types were placed in this encounter.   -I have reviewed the patients home medicines and have made adjustments as needed    Co morbidities that complicate the patient evaluation  Past Medical History:  Diagnosis Date   Anxiety    HOH (hard of hearing)    Migraine    Stroke [facility])    UTI (urinary tract infection)       Dispostion: Disposition decision including need for hospitalization was considered, and patient discharged from emergency department.    Final Clinical Impression(s) / ED Diagnoses Final diagnoses:  Atypical chest pain     This chart was dictated using voice recognition software.  Despite best efforts to proofread,  errors can occur which can change the documentation meaning.    Mordecai Applebaum, MD 05/09/24 [phone]

## 2024-05-09 NOTE — ED Triage Notes (Signed)
 Pt reports CP x 2d; reports RUE numbness today; no weakness noted, pt using w/o difficulty; c/o pain to posterior head

## 2024-05-09 NOTE — Discharge Instructions (Addendum)
 We evaluated you for your chest pain.  Your testing in the emergency department is reassuring.  We did not see signs of a heart attack.  We would recommend that you follow-up with the cardiologist.  We have placed a cardiology referral.  They will call you for follow-up.  Please return if you develop any new or worsening symptoms such as fevers or chills, cough, vomiting, severe headaches, chest pain, or any other concerning symptoms.

## 2024-05-09 NOTE — ED Notes (Signed)
 Patient transported to CT

## 2024-08-24 ENCOUNTER — Ambulatory Visit (INDEPENDENT_AMBULATORY_CARE_PROVIDER_SITE_OTHER): Payer: Medicare HMO

## 2024-08-24 VITALS — BP 120/60 | HR 99 | Temp 97.6°F | Ht 64.0 in | Wt 163.8 lb

## 2024-08-24 DIAGNOSIS — Z23 Encounter for immunization: Secondary | ICD-10-CM

## 2024-08-24 DIAGNOSIS — Z0000 Encounter for general adult medical examination without abnormal findings: Secondary | ICD-10-CM

## 2024-08-24 NOTE — Progress Notes (Addendum)
 Subjective:   Shannon Wise is a 89 y.o. who presents for a Medicare Wellness preventive visit.  As a reminder, Annual Wellness Visits don't include a physical exam, and some assessments may be limited, especially if this visit is performed virtually. We may recommend an in-person follow-up visit with your provider if needed.  Visit Complete: In person    Persons Participating in Visit: Patient assisted by Sharolyn.  AWV Questionnaire: No: Patient Medicare AWV questionnaire was not completed prior to this visit.  Cardiac Risk Factors include: advanced age (>85men, >41 women)     Objective:    Today's Vitals   08/24/24 1414 08/24/24 1416  BP: 120/60   Pulse: 99   Temp: 97.6 F (36.4 C)   TempSrc: Oral   SpO2: 94%   Weight: 163 lb 12.8 oz (74.3 kg)   Height: 5' 4 (1.626 m)   PainSc:  8    Body mass index is 28.12 kg/m.     08/24/2024    2:27 PM 05/09/2024    3:32 PM 08/23/2023    2:06 PM 09/13/2022    3:50 PM 04/21/2022   10:00 PM 04/21/2022    5:47 PM 09/07/2021   11:03 AM  Advanced Directives  Does Patient Have a Medical Advance Directive? No No No Yes No No No  Type of Theme park manager;Living will     Copy of Healthcare Power of Attorney in Chart?    No - copy requested     Would patient like information on creating a medical advance directive?     No - Patient declined No - Patient declined No - Patient declined    Current Medications (verified) Outpatient Encounter Medications as of 08/24/2024  Medication Sig   acetaminophen  (TYLENOL ) 650 MG CR tablet Take 650 mg by mouth every 8 (eight) hours as needed for pain.   rizatriptan  (MAXALT ) 5 MG tablet Take 1 tablet (5 mg total) by mouth as needed for migraine. May repeat in 2 hours if needed   naproxen  (NAPROSYN ) 500 MG tablet TAKE 1 TABLET(500 MG) BY MOUTH TWICE DAILY WITH A MEAL (Patient not taking: Reported on 08/24/2024)   [DISCONTINUED] fluticasone (FLONASE) 50 MCG/ACT nasal  spray 1 spray by Each Nare route daily for 10 days.   [DISCONTINUED] loratadine (CLARITIN) 10 MG tablet Take by mouth.   No facility-administered encounter medications on file as of 08/24/2024.    Allergies (verified) Codeine, Levaquin  [levofloxacin ], and Red dye #40 (allura red)   History: Past Medical History:  Diagnosis Date   Anxiety    HOH (hard of hearing)    Migraine    Stroke (HCC)    UTI (urinary tract infection)    Past Surgical History:  Procedure Laterality Date   ABDOMINAL HYSTERECTOMY     BLADDER SURGERY     History reviewed. No pertinent family history. Social History   Socioeconomic History   Marital status: Divorced    Spouse name: Not on file   Number of children: Not on file   Years of education: Not on file   Highest education level: Not on file  Occupational History   Not on file  Tobacco Use   Smoking status: Never   Smokeless tobacco: Former  Advertising account planner   Vaping status: Never Used  Substance and Sexual Activity   Alcohol use: No   Drug use: No   Sexual activity: Not Currently  Other Topics Concern   Not on file  Social History Narrative   Not on file   Social Drivers of Health   Financial Resource Strain: Low Risk  (08/24/2024)   Overall Financial Resource Strain (CARDIA)    Difficulty of Paying Living Expenses: Not hard at all  Food Insecurity: No Food Insecurity (08/24/2024)   Hunger Vital Sign    Worried About Running Out of Food in the Last Year: Never true    Ran Out of Food in the Last Year: Never true  Transportation Needs: No Transportation Needs (08/24/2024)   PRAPARE - Administrator, Civil Service (Medical): No    Lack of Transportation (Non-Medical): No  Physical Activity: Inactive (08/24/2024)   Exercise Vital Sign    Days of Exercise per Week: 0 days    Minutes of Exercise per Session: 0 min  Stress: No Stress Concern Present (08/24/2024)   [employer] of Occupational Health - Occupational Stress  Questionnaire    Feeling of Stress: Not at all  Social Connections: Socially Isolated (08/24/2024)   Social Connection and Isolation Panel    Frequency of Communication with Friends and Family: More than three times a week    Frequency of Social Gatherings with Friends and Family: Not on file    Attends Religious Services: Never    Database administrator or Organizations: No    Attends Engineer, structural: Never    Marital Status: Divorced    Tobacco Counseling Counseling given: Not Answered    Clinical Intake:  Pre-visit preparation completed: Yes  Pain : 0-10 Pain Score: 8  Pain Type: Chronic pain Pain Location: Generalized Pain Descriptors / Indicators: Aching Pain Onset: More than a month ago Pain Frequency: Constant     Nutritional Status: BMI 25 -29 Overweight Nutritional Risks: None Diabetes: No  No results found for: HGBA1C   How often do you need to have someone help you when you read instructions, pamphlets, or other written materials from your doctor or pharmacy?: 4 - Often  Interpreter Needed?: No  Information entered by :: NAllen LPN   Activities of Daily Living     08/24/2024    2:19 PM  In your present state of health, do you have any difficulty performing the following activities:  Hearing? 1  Comment declines hearing aids  Vision? 0  Comment wears glasses  Difficulty concentrating or making decisions? 0  Walking or climbing stairs? 1  Dressing or bathing? 0  Doing errands, shopping? 1  Preparing Food and eating ? Y  Using the Toilet? N  In the past six months, have you accidently leaked urine? N  Do you have problems with loss of bowel control? N  Managing your Medications? N  Managing your Finances? Y  Housekeeping or managing your Housekeeping? Y    Patient Care Team: Thedora Garnette HERO, MD as PCP - General (Family Medicine) Chick Venetia BRAVO, MD as Consulting Physician (Sports Medicine)  I have updated your Care Teams  any recent Medical Services you may have received from other providers in the past year.     Assessment:   This is a routine wellness examination for [name].  Hearing/Vision screen Hearing Screening - Comments:: Declines hearing aids Vision Screening - Comments:: No regular eye exams   Goals Addressed             This Visit's Progress    Patient Stated       08/24/2024, stay healthy       Depression Screen  08/24/2024    2:30 PM 08/23/2023    2:09 PM 09/13/2022    3:49 PM 01/24/2022   11:21 AM 09/07/2021   11:01 AM  PHQ 2/9 Scores  PHQ - 2 Score 0 0 0 0 0  PHQ- 9 Score  0       Fall Risk     08/24/2024    2:29 PM 08/23/2023    2:07 PM 09/13/2022    3:46 PM 01/24/2022   11:20 AM 09/07/2021   11:03 AM  Fall Risk   Falls in the past year? 0 0 0 0 0  Number falls in past yr: 0 0 0 0 0  Injury with Fall? 0 0 0 0 0  Risk for fall due to : No Fall Risks Impaired balance/gait No Fall Risks No Fall Risks   Follow up Falls evaluation completed;Falls prevention discussed Falls prevention discussed;Falls evaluation completed Falls prevention discussed  Falls evaluation completed  Falls evaluation completed;Falls prevention discussed      Data saved with a previous flowsheet row definition    MEDICARE RISK AT HOME:  Medicare Risk at Home Any stairs in or around the home?: No If so, are there any without handrails?: No Home free of loose throw rugs in walkways, pet beds, electrical cords, etc?: Yes Adequate lighting in your home to reduce risk of falls?: Yes Life alert?: No Use of a cane, walker or w/c?: No Grab bars in the bathroom?: No Shower chair or bench in shower?: No Elevated toilet seat or a handicapped toilet?: No  TIMED UP AND GO:  Was the test performed?  Yes  Length of time to ambulate 10 feet: 6 sec Gait slow and steady without use of assistive device  Cognitive Function: Unable: Due to language barrier, hearing or vision limitations or other son says  does not keep up with things        09/13/2022    3:51 PM  6CIT Screen  What Year? 0 points  What month? 0 points  What time? 0 points  Count back from 20 0 points  Months in reverse 0 points  Repeat phrase 0 points  Total Score 0 points    Immunizations Immunization History  Administered Date(s) Administered   Fluad Quad(high Dose 65+) 11/06/2021   INFLUENZA, HIGH DOSE SEASONAL PF 10/21/2013, 01/22/2015   Influenza,inj,Quad PF,6+ Mos 10/11/2016   Pneumococcal Conjugate-13 10/03/2016   Pneumococcal Polysaccharide-23 04/18/2013, 10/24/2013, 03/23/2014   Pneumococcal-Unspecified 09/30/2010   Tdap 04/18/2013   Tetanus 04/18/2013    Screening Tests Health Maintenance  Topic Date Due   Zoster Vaccines- Shingrix (1 of 2) Never done   DTaP/Tdap/Td (2 - Td or Tdap) 04/19/2023   Influenza Vaccine  07/10/2024   COVID-19 Vaccine (1 - 2024-25 season) Never done   Medicare Annual Wellness (AWV)  08/24/2025   Pneumococcal Vaccine: 50+ Years  Completed   DEXA SCAN  Completed   HPV VACCINES  Aged Out   Meningococcal B Vaccine  Aged Out    Health Maintenance Items Addressed: Influenza vaccine given, Due for TDAP, shingles and covid vaccine.  Additional Screening:  Vision Screening: Recommended annual ophthalmology exams for early detection of glaucoma and other disorders of the eye. Is the patient up to date with their annual eye exam?  No  Who is the provider or what is the name of the office in which the patient attends annual eye exams? none  Dental Screening: Recommended annual dental exams for proper oral hygiene  [facility]  Referral / Chronic Care Management: CRR required this visit?  No   CCM required this visit?  No   Plan:    I have personally reviewed and noted the following in the patient's chart:   Medical and social history Use of alcohol, tobacco or illicit drugs  Current medications and supplements including opioid prescriptions. Patient is not  currently taking opioid prescriptions. Functional ability and status Nutritional status Physical activity Advanced directives List of other physicians Hospitalizations, surgeries, and ER visits in previous 12 months Vitals Screenings to include cognitive, depression, and falls Referrals and appointments  In addition, I have reviewed and discussed with patient certain preventive protocols, quality metrics, and best practice recommendations. A written personalized care plan for preventive services as well as general preventive health recommendations were provided to patient.   Ardella FORBES Dawn, LPN   0/[id number]   After Visit Summary: (In Person-Printed) AVS printed and given to the patient  Notes: Nothing significant to report at this time.

## 2024-08-24 NOTE — Patient Instructions (Signed)
 Shannon Wise,  Thank you for taking the time for your Medicare Wellness Visit. I appreciate your continued commitment to your health goals. Please review the care plan we discussed, and feel free to reach out if I can assist you further.  Medicare recommends these wellness visits once per year to help you and your care team stay ahead of potential health issues. These visits are designed to focus on prevention, allowing your provider to concentrate on managing your acute and chronic conditions during your regular appointments.  Please note that Annual Wellness Visits do not include a physical exam. Some assessments may be limited, especially if the visit was conducted virtually. If needed, we may recommend a separate in-person follow-up with your provider.  Ongoing Care Seeing your primary care provider every 3 to 6 months helps us  monitor your health and provide consistent, personalized care.   Referrals If a referral was made during today's visit and you haven't received any updates within two weeks, please contact the referred provider directly to check on the status.  Recommended Screenings:  Health Maintenance  Topic Date Due   Zoster (Shingles) Vaccine (1 of 2) Never done   DTaP/Tdap/Td vaccine (2 - Td or Tdap) 04/19/2023   Flu Shot  07/10/2024   COVID-19 Vaccine (1 - 2024-25 season) Never done   Medicare Annual Wellness Visit  08/24/2025   Pneumococcal Vaccine for age over 62  Completed   DEXA scan (bone density measurement)  Completed   HPV Vaccine  Aged Out   Meningitis B Vaccine  Aged Out       08/24/2024    2:27 PM  Advanced Directives  Does Patient Have a Medical Advance Directive? No   Advance Care Planning is important because it: Ensures you receive medical care that aligns with your values, goals, and preferences. Provides guidance to your family and loved ones, reducing the emotional burden of decision-making during critical moments.  Vision: Annual vision  screenings are recommended for early detection of glaucoma, cataracts, and diabetic retinopathy. These exams can also reveal signs of chronic conditions such as diabetes and high blood pressure.  Dental: Annual dental screenings help detect early signs of oral cancer, gum disease, and other conditions linked to overall health, including heart disease and diabetes.  Please see the attached documents for additional preventive care recommendations.

## 2024-09-29 ENCOUNTER — Ambulatory Visit: Admitting: Family Medicine

## 2024-10-09 DIAGNOSIS — M1990 Unspecified osteoarthritis, unspecified site: Secondary | ICD-10-CM | POA: Diagnosis not present

## 2024-10-09 DIAGNOSIS — R195 Other fecal abnormalities: Secondary | ICD-10-CM | POA: Diagnosis not present

## 2024-10-09 DIAGNOSIS — N200 Calculus of kidney: Secondary | ICD-10-CM | POA: Diagnosis not present

## 2024-10-09 DIAGNOSIS — H903 Sensorineural hearing loss, bilateral: Secondary | ICD-10-CM | POA: Diagnosis not present

## 2024-10-09 DIAGNOSIS — N12 Tubulo-interstitial nephritis, not specified as acute or chronic: Secondary | ICD-10-CM | POA: Diagnosis not present

## 2024-10-09 DIAGNOSIS — M4186 Other forms of scoliosis, lumbar region: Secondary | ICD-10-CM | POA: Diagnosis not present

## 2024-10-09 DIAGNOSIS — R300 Dysuria: Secondary | ICD-10-CM | POA: Diagnosis not present

## 2024-10-09 DIAGNOSIS — M545 Low back pain, unspecified: Secondary | ICD-10-CM | POA: Diagnosis not present

## 2024-10-09 DIAGNOSIS — M5136 Other intervertebral disc degeneration, lumbar region with discogenic back pain only: Secondary | ICD-10-CM | POA: Diagnosis not present

## 2024-10-09 DIAGNOSIS — N10 Acute tubulo-interstitial nephritis: Secondary | ICD-10-CM | POA: Diagnosis not present

## 2024-10-09 DIAGNOSIS — G43909 Migraine, unspecified, not intractable, without status migrainosus: Secondary | ICD-10-CM | POA: Diagnosis not present

## 2024-10-09 DIAGNOSIS — N3090 Cystitis, unspecified without hematuria: Secondary | ICD-10-CM | POA: Diagnosis not present

## 2024-10-10 DIAGNOSIS — N12 Tubulo-interstitial nephritis, not specified as acute or chronic: Secondary | ICD-10-CM | POA: Diagnosis not present

## 2024-10-18 NOTE — Progress Notes (Signed)
 [facility] PRIMARY CARE [facility] [address] [place] [address] Dept: [phone] Dept Fax: [phone]  Office Visit  Subjective:    Patient ID: Shannon Wise, female    DOB: [birth year], 89 y.o..   MRN: [id number]  No chief complaint on file.  History of Present Illness:  Patient is in today for Hospital Follow-up for Pyelonephritis.   Shannon Wise was seen in the hospital on 10/09/2204 for back pain and abdominal that had been ongoing for three days. Her urinalysis was abnormal, and the urine culture showed E.coli, for which was prescribed cephalexin  500 mg every four hours for five days.    Past Medical History: Patient Active Problem List   Diagnosis Date Noted   Left upper arm pain 05/03/2021   Cerebrovascular disease 05/03/2021   Diverticulosis 05/03/2021   Aortic atherosclerosis 05/03/2021   Cataracts, bilateral 05/03/2021   Perennial allergic rhinitis with seasonal variation 06/04/2020   Chronic UTI (urinary tract infection) 06/04/2020   Chronic mixed headache syndrome 06/04/2020   Primary osteoarthritis of both knees 05/17/2020   History of kidney stones 07/11/2018   Sleep apnea 11/03/2016   Generalized anxiety disorder 11/03/2016   TMJ (temporomandibular joint disorder) 10/11/2016   PVC (premature ventricular contraction) 03/16/2016   Palpitations 03/16/2016   Sensorineural hearing loss (SNHL), bilateral 01/04/2016   Facet arthropathy 12/29/2014   DDD (degenerative disc disease), lumbar 12/29/2014   Cervical spine fracture (HCC) 03/25/2014   Past Surgical History:  Procedure Laterality Date   ABDOMINAL HYSTERECTOMY     BLADDER SURGERY     No family history on file. Outpatient Medications Prior to Visit  Medication Sig Dispense Refill   acetaminophen  (TYLENOL ) 650 MG CR tablet Take 650 mg by mouth every 8 (eight) hours as needed for pain.     naproxen  (NAPROSYN ) 500 MG tablet TAKE 1 TABLET(500 MG) BY MOUTH TWICE  DAILY WITH A MEAL (Patient not taking: Reported on 08/24/2024) 30 tablet 0   rizatriptan  (MAXALT ) 5 MG tablet Take 1 tablet (5 mg total) by mouth as needed for migraine. May repeat in 2 hours if needed 10 tablet 1   No facility-administered medications prior to visit.   Allergies  Allergen Reactions   Codeine    Levaquin  [Levofloxacin ] Other (See Comments)    Abdominal pain   Red Dye #40 (Allura Red)      Objective:   There were no vitals filed for this visit. There is no height or weight on file to calculate BMI.   General: Well developed, well nourished. No acute distress. HEENT: Normocephalic, non-traumatic. PERRL, EOMI. Conjunctiva clear. External ears normal. EAC and TMs normal bilaterally. Nose    clear without congestion or rhinorrhea. Mucous membranes moist. Oropharynx clear. Good dentition. Neck: Supple. No lymphadenopathy. No thyromegaly. Lungs: Clear to auscultation bilaterally. No wheezing, rales or rhonchi. CV: RRR without murmurs or rubs. Pulses 2+ bilaterally. Abdomen: Soft, non-tender. Bowel sounds positive, normal pitch and frequency. No hepatosplenomegaly. No rebound or guarding. Back: Straight. No CVA tenderness bilaterally. Extremities: Full ROM. No joint swelling or tenderness. No edema noted. Skin: Warm and dry. No rashes. Neuro: CN II-XII intact. Normal sensation and DTR bilaterally. Psych: Alert and oriented. Normal mood and affect.  Health Maintenance Due  Topic Date Due   Zoster Vaccines- Shingrix (1 of 2) Never done   DTaP/Tdap/Td (2 - Td or Tdap) 04/19/2023   COVID-19 Vaccine (1 - 2025-26 season) Never done    Lab Results {Labs (Optional):29002}   POC Urinalysis  Auto without Microscopic 10/09/24 10/09/24 Component  Yellow Orange Abnormal  Color, Urine  Cloudy Abnormal  Ex. Turbid Abnormal  Clarity, Urine  Negative Negative Glucose, Urine  Negative Negative Bilirubin, Urine  Negative Negative Ketones, Urine  1.025 1.016 Specific Gravity, Urine   Moderate Abnormal  2+ Abnormal  Blood, Urine  6.5 6.0 pH, Urine  100 Abnormal  70 Abnormal  Protein, Urine  0.2 Normal Urobilinogen, Urine  Negative Positive Abnormal  Nitrite, Urine  Large Abnormal  500 Abnormal  Leukocyte Esterase, Urine    Contains abnormal data Urine Culture Susceptibility  Organism Antibiotic Method Susceptibility  Escherichia coli Amoxicillin  + Clavulanate MIC <=8/4 ug/ml: Susceptible  Escherichia coli Ampicillin MIC >16 ug/ml: Resistant  Escherichia coli Ampicillin + Sulbactam MIC >16 ug/ml: Resistant  Escherichia coli Cefazolin MIC 8 ug/ml: Resistant  Escherichia coli Cefotaxime MIC <=2 ug/ml: Susceptible  Escherichia coli Ceftriaxone  MIC <=1 ug/ml: Susceptible  Escherichia coli Cefuroxime MIC 8 ug/ml: Susceptible  Escherichia coli Ciprofloxacin MIC >2 ug/ml: Resistant  Escherichia coli Ertapenem MIC <=0.5 ug/ml: Susceptible  Escherichia coli Gentamicin MIC <=2 ug/ml: Susceptible  Escherichia coli Meropenem MIC <=1 ug/ml: Susceptible  Escherichia coli Nitrofurantoin  MIC <=32 ug/ml: Susceptible  Escherichia coli Piperacillin + Tazobactam MIC <=8 ug/ml: Susceptible  Escherichia coli Tetracycline MIC <=4 ug/ml: Susceptible  Escherichia coli Trimethoprim  + Sulfamethoxazole  MIC <=0.5/9.5 ug/ml: Susceptible  Escherichia coli MALDI ID MIC        Related to CBC with Differential 10/09/24 10/10/24 Component  12.48 High  7.30 WBC  5.20 High  4.62 RBC  15.8 High  13.9 Hemoglobin  47.1 High  41.8 Hematocrit  90.6 90.4 Mean Corpuscular Volume (MCV)  30.3 30.1 Mean Corpuscular Hemoglobin (MCH)  33.4 33.3 Mean Corpuscular Hemoglobin Conc (MCHC)  12.7 12.8 Red Cell Distribution Width (RDW)  355 335 Platelet Count (PLT)  7.0 7.3 Mean Platelet Volume (MPV)  70 -- Neutrophils %  20 -- Lymphocytes %  9 -- Monocytes %  1 -- Eosinophils %  1 -- Basophils %  8.80 High  -- Neutrophils Absolute  2.50 -- Lymphocytes #  1.10 High  -- Monocytes #  0.10 -- Eosinophils #   0.10 -- Basophils #   Assessment & Plan:   Problem List Items Addressed This Visit   None   No follow-ups on file.    Garnette CHRISTELLA Simpler, MD  I,Emily Lagle,acting as a scribe for Garnette CHRISTELLA Simpler, MD.,have documented all relevant documentation on the behalf of Garnette CHRISTELLA Simpler, MD.  I, Garnette CHRISTELLA Simpler, MD, have reviewed all documentation for this visit. The documentation on 10/19/2024 for the exam, diagnosis, procedures, and orders are all accurate and complete.

## 2024-10-19 ENCOUNTER — Ambulatory Visit (INDEPENDENT_AMBULATORY_CARE_PROVIDER_SITE_OTHER): Admitting: Family Medicine

## 2024-10-19 ENCOUNTER — Encounter: Payer: Self-pay | Admitting: Family Medicine

## 2024-10-19 VITALS — BP 130/78 | HR 97 | Temp 97.5°F | Ht 64.0 in | Wt 164.4 lb

## 2024-10-19 DIAGNOSIS — G4489 Other headache syndrome: Secondary | ICD-10-CM | POA: Diagnosis not present

## 2024-10-19 DIAGNOSIS — N390 Urinary tract infection, site not specified: Secondary | ICD-10-CM | POA: Diagnosis not present

## 2024-10-19 DIAGNOSIS — H6121 Impacted cerumen, right ear: Secondary | ICD-10-CM | POA: Diagnosis not present

## 2024-10-19 MED ORDER — RIZATRIPTAN BENZOATE 5 MG PO TABS: 5.0000 mg | ORAL_TABLET | ORAL | 1 refills | Status: AC | PRN

## 2024-10-19 NOTE — Assessment & Plan Note (Signed)
 Stable. I will renew the rizatriptan .

## 2024-11-10 ENCOUNTER — Ambulatory Visit: Admitting: Family Medicine

## 2025-04-19 ENCOUNTER — Ambulatory Visit: Admitting: Family Medicine

## 2025-08-30 ENCOUNTER — Ambulatory Visit
